# Patient Record
Sex: Female | Born: 1955 | Race: Black or African American | Hispanic: No | Smoking: Never smoker
Health system: Southern US, Community
[De-identification: ages and names within clinical notes are randomized; demographics above are authoritative.]

## PROBLEM LIST (undated history)

## (undated) DIAGNOSIS — C801 Malignant (primary) neoplasm, unspecified: Secondary | ICD-10-CM

## (undated) DIAGNOSIS — T7840XA Allergy, unspecified, initial encounter: Secondary | ICD-10-CM

## (undated) DIAGNOSIS — C189 Malignant neoplasm of colon, unspecified: Secondary | ICD-10-CM

## (undated) DIAGNOSIS — K922 Gastrointestinal hemorrhage, unspecified: Secondary | ICD-10-CM

## (undated) DIAGNOSIS — D649 Anemia, unspecified: Secondary | ICD-10-CM

## (undated) HISTORY — DX: Allergy, unspecified, initial encounter: T78.40XA

## (undated) HISTORY — DX: Malignant neoplasm of colon, unspecified: C18.9

## (undated) HISTORY — DX: Gastrointestinal hemorrhage, unspecified: K92.2

## (undated) HISTORY — DX: Anemia, unspecified: D64.9

---

## 2004-10-13 ENCOUNTER — Emergency Department: Payer: Self-pay | Admitting: Emergency Medicine

## 2010-08-25 ENCOUNTER — Emergency Department: Payer: Self-pay | Admitting: Emergency Medicine

## 2021-03-21 ENCOUNTER — Other Ambulatory Visit: Payer: Self-pay

## 2021-03-21 ENCOUNTER — Emergency Department
Admission: EM | Admit: 2021-03-21 | Discharge: 2021-03-22 | Disposition: A | Discharge status: Home / Self Care | Payer: BC Managed Care – PPO | Attending: Emergency Medicine | Admitting: Emergency Medicine

## 2021-03-21 DIAGNOSIS — D649 Anemia, unspecified: Secondary | ICD-10-CM | POA: Diagnosis not present

## 2021-03-21 DIAGNOSIS — R110 Nausea: Secondary | ICD-10-CM | POA: Diagnosis present

## 2021-03-21 LAB — IRON AND TIBC
Iron: 10 ug/dL — ABNORMAL LOW (ref 28–170)
Saturation Ratios: 3 % — ABNORMAL LOW (ref 10.4–31.8)
TIBC: 335 ug/dL (ref 250–450)
UIBC: 325 ug/dL

## 2021-03-21 LAB — COMPREHENSIVE METABOLIC PANEL
ALT: 7 U/L (ref 0–44)
AST: 12 U/L — ABNORMAL LOW (ref 15–41)
Albumin: 3 g/dL — ABNORMAL LOW (ref 3.5–5.0)
Alkaline Phosphatase: 63 U/L (ref 38–126)
Anion gap: 7 (ref 5–15)
BUN: 11 mg/dL (ref 8–23)
CO2: 26 mmol/L (ref 22–32)
Calcium: 9 mg/dL (ref 8.9–10.3)
Chloride: 104 mmol/L (ref 98–111)
Creatinine, Ser: 0.72 mg/dL (ref 0.44–1.00)
GFR, Estimated: >60 mL/min (ref >60)
Glucose, Bld: 96 mg/dL (ref 70–99)
Potassium: 3.5 mmol/L (ref 3.5–5.1)
Sodium: 137 mmol/L (ref 135–145)
Total Bilirubin: 0.3 mg/dL (ref 0.3–1.2)
Total Protein: 8 g/dL (ref 6.5–8.1)

## 2021-03-21 LAB — CBC WITH DIFFERENTIAL/PLATELET
Abs Immature Granulocytes: 0.03 10*3/uL (ref 0.00–0.07)
Basophils Absolute: 0.1 10*3/uL (ref 0.0–0.1)
Basophils Relative: 1 %
Eosinophils Absolute: 0.2 10*3/uL (ref 0.0–0.5)
Eosinophils Relative: 2 %
HCT: 22.8 % — ABNORMAL LOW (ref 36.0–46.0)
Hemoglobin: 6.7 g/dL — ABNORMAL LOW (ref 12.0–15.0)
Immature Granulocytes: 0 %
Lymphocytes Relative: 17 %
Lymphs Abs: 1.7 10*3/uL (ref 0.7–4.0)
MCH: 19.9 pg — ABNORMAL LOW (ref 26.0–34.0)
MCHC: 29.4 g/dL — ABNORMAL LOW (ref 30.0–36.0)
MCV: 67.7 fL — ABNORMAL LOW (ref 80.0–100.0)
Monocytes Absolute: 0.5 10*3/uL (ref 0.1–1.0)
Monocytes Relative: 5 %
Neutro Abs: 7.4 10*3/uL (ref 1.7–7.7)
Neutrophils Relative %: 75 %
Platelets: 425 10*3/uL — ABNORMAL HIGH (ref 150–400)
RBC: 3.37 MIL/uL — ABNORMAL LOW (ref 3.87–5.11)
RDW: 16.7 % — ABNORMAL HIGH (ref 11.5–15.5)
Smear Review: NORMAL
WBC: 9.8 10*3/uL (ref 4.0–10.5)
nRBC: 0 % (ref 0.0–0.2)

## 2021-03-21 LAB — URINALYSIS, COMPLETE (UACMP) WITH MICROSCOPIC
Bacteria, UA: NONE SEEN
Bilirubin Urine: NEGATIVE
Glucose, UA: NEGATIVE mg/dL
Ketones, ur: 20 mg/dL — AB
Nitrite: NEGATIVE
Protein, ur: 30 mg/dL — AB
Specific Gravity, Urine: 1.028 (ref 1.005–1.030)
WBC, UA: >50 WBC/hpf — ABNORMAL HIGH (ref 0–5)
pH: 5 (ref 5.0–8.0)

## 2021-03-21 LAB — FERRITIN: Ferritin: 27 ng/mL (ref 11–307)

## 2021-03-21 LAB — ABO/RH: ABO/RH(D): O POS

## 2021-03-21 LAB — HEMOGLOBIN AND HEMATOCRIT, BLOOD
HCT: 23.5 % — ABNORMAL LOW (ref 36.0–46.0)
Hemoglobin: 6.9 g/dL — ABNORMAL LOW (ref 12.0–15.0)

## 2021-03-21 LAB — VITAMIN B12: Vitamin B-12: 534 pg/mL (ref 180–914)

## 2021-03-21 LAB — RETICULOCYTES
Immature Retic Fract: 11.9 % (ref 2.3–15.9)
RBC.: 3.48 MIL/uL — ABNORMAL LOW (ref 3.87–5.11)
Retic Count, Absolute: 25.8 10*3/uL (ref 19.0–186.0)
Retic Ct Pct: 0.7 % (ref 0.4–3.1)

## 2021-03-21 LAB — PREPARE RBC (CROSSMATCH)

## 2021-03-21 LAB — FOLATE: Folate: 9.3 ng/mL (ref >5.9)

## 2021-03-21 MED ORDER — ONDANSETRON 4 MG PO TBDP
4.0000 mg | ORAL_TABLET | Freq: Once | ORAL | Status: AC
  Administered 2021-03-21: 4 mg via ORAL
  Filled 2021-03-21: qty 1

## 2021-03-21 MED ORDER — SODIUM CHLORIDE 0.9 % IV SOLN
10.0000 mL/h | Freq: Once | INTRAVENOUS | Status: AC
  Administered 2021-03-21: 10 mL/h via INTRAVENOUS

## 2021-03-21 NOTE — ED Notes (Signed)
Rate now increased from 43ml/hr to 164ml/hr -- blood continues to transfuse via 20G R AC; dressing dry and intact- no edema, tenderness, redness at site

## 2021-03-21 NOTE — ED Provider Notes (Signed)
[facility] Emergency Department Provider Note   ____________________________________________   Event Date/Time   First MD Initiated Contact with Patient 03/21/21 1448     (approximate)  I have reviewed the triage vital signs and the nursing notes.   HISTORY  Chief Complaint Nausea   HPI Alicia Coleman is a 65 y.o. female presents to the ED with complaint of nausea for approximately 1 week.  Patient states that she was prescribed Augmentin which she is taken intermittently due to stomach upset.  She was originally prescribed this for sinus infection.  Patient also reports that her stomach feels "bloated" and that people around her telling her that she has lost weight which makes her feel like she has a sickness and not doing well.  She states that she has had both COVID vaccines in January/February and is worried that she is having some "side effects".  She denies any actual vomiting or diarrhea.  Denies any fever, chills, cough or known exposure to COVID.  She reports approximately a 10 to 15 pound weight loss since the beginning of the year but also she has cut out certain foods from her diet which may attribute to her weight loss.  Currently she denies any pain.         History reviewed. No pertinent past medical history.  There are no problems to display for this patient.   History reviewed. No pertinent surgical history.  Prior to Admission medications   Not on File    Allergies Patient has no allergy information on record.  No family history on file.  Social History    Review of Systems  Constitutional: No fever/chills Eyes: No visual changes. ENT: No sore throat. Cardiovascular: Denies chest pain. Respiratory: Denies shortness of breath.  Negative for cough. Gastrointestinal: No abdominal pain.  Positive for bloating sensation.  Positive nausea, no vomiting.  No diarrhea.  No constipation. Genitourinary: Negative for dysuria.   Negative for hematuria. Musculoskeletal: Negative for muscle aches. Skin: Negative for rash. Neurological: Negative for headaches, focal weakness or numbness.  ____________________________________________   PHYSICAL EXAM:  VITAL SIGNS: ED Triage Vitals [03/21/21 1433]  Enc Vitals Group     BP 124/75     Pulse Rate 99     Resp 18     Temp 98.6 F (37 C)     Temp Source Oral     SpO2 98 %     Weight 160 lb (72.6 kg)     Height 5\' 6"  (1.676 m)     Head Circumference      Peak Flow      Pain Score 0     Pain Loc      Pain Edu?      Excl. in [place]?     Constitutional: Alert and oriented. Well appearing and in no acute distress. Eyes: Conjunctivae are normal.  Head: Atraumatic. Nose: No congestion/rhinnorhea. Mouth/Throat: Mucous membranes are moist.  Oropharynx non-erythematous.  No exudate noted. Neck: No stridor.   Cardiovascular: Normal rate, regular rhythm. Grossly normal heart sounds.  Good peripheral circulation. Respiratory: Normal respiratory effort.  No retractions. Lungs CTAB. Gastrointestinal: Soft and nontender. No distention.  Bowel sounds normoactive x4 quadrants.  No CVA tenderness.  Rectal exam, nontender with Hemoccult negative.  Also control was noted to be positive. Musculoskeletal: Moves upper and lower extremities without any difficulty.  Normal gait was noted.  Patient is ambulatory without any assistance. Neurologic:  Normal speech and language. No gross focal neurologic  deficits are appreciated. No gait instability. Skin:  Skin is warm, dry and intact. No rash noted. Psychiatric: Mood and affect are normal. Speech and behavior are normal.  ____________________________________________   LABS (all labs ordered are listed, but only abnormal results are displayed)  Labs Reviewed  CBC WITH DIFFERENTIAL/PLATELET - Abnormal; Notable for the following components:      Result Value   RBC 3.37 (*)    Hemoglobin 6.7 (*)    HCT 22.8 (*)    MCV 67.7 (*)     MCH 19.9 (*)    MCHC 29.4 (*)    RDW 16.7 (*)    Platelets 425 (*)    All other components within normal limits  COMPREHENSIVE METABOLIC PANEL - Abnormal; Notable for the following components:   Albumin 3.0 (*)    AST 12 (*)    All other components within normal limits  URINALYSIS, COMPLETE (UACMP) WITH MICROSCOPIC - Abnormal; Notable for the following components:   Color, Urine YELLOW (*)    APPearance HAZY (*)    Hgb urine dipstick MODERATE (*)    Ketones, ur 20 (*)    Protein, ur 30 (*)    Leukocytes,Ua LARGE (*)    WBC, UA >50 (*)    All other components within normal limits  IRON AND TIBC - Abnormal; Notable for the following components:   Iron 10 (*)    Saturation Ratios 3 (*)    All other components within normal limits  RETICULOCYTES - Abnormal; Notable for the following components:   RBC. 3.48 (*)    All other components within normal limits  HEMOGLOBIN AND HEMATOCRIT, BLOOD - Abnormal; Notable for the following components:   Hemoglobin 6.9 (*)    HCT 23.5 (*)    All other components within normal limits  FOLATE  FERRITIN  VITAMIN B12  TYPE AND SCREEN  PREPARE RBC (CROSSMATCH)  ABO/RH   ____________________________________________  EKG   ____________________________________________  RADIOLOGY Leana Gamer, personally viewed and evaluated these images (plain radiographs) as part of my medical decision making, as well as reviewing the written report by the radiologist.   Official radiology report(s): No results found.  ____________________________________________   PROCEDURES  Procedure(s) performed (including Critical Care):  Procedures   ____________________________________________   INITIAL IMPRESSION / ASSESSMENT AND PLAN / ED COURSE  As part of my medical decision making, I reviewed the following data within the electronic MEDICAL RECORD NUMBER Notes from prior ED visits and [place] Controlled Substance  Database  ----------------------------------------- 5:20 PM on 03/21/2021 ----------------------------------------- Discussed blood products and blood transfusion with patient.  We discussed options of iron tablets versus receiving a blood transfusion.  Patient is aware that she will need to follow-up with her hematologist either way.  Patient agrees to have blood transfusion in the ED. discussed with Dr. Archie Balboa and work-up for transfusion ordered.  65 year old female presents to the ED with complaint of nausea after taking Augmentin for sinus infection.  Patient states that the first tablet made her sick and she did not take any more until the next day when she again tried it with nausea.  She denied any other symptoms.  Incidental finding with lab work-up was that patient was severely anemic with a hemoglobin of 6.9 and hematocrit 23.5.  Discussed this with Dr. Archie Balboa and also spent a great deal of time talking to the patient about the difference between getting blood products tonight versus taking tablets.  She was told that either way she would  need to follow-up with the hematologist.  Patient agrees with this and elected to have packed red blood cells tonight.  Patient care is being transferred to Dr. Archie Balboa at 2016.    Clinical Course as of 03/21/21 2016  Fri Mar 21, 2021  1640 Hemoglobin(!): 6.7 [RS]    Clinical Course User Index [RS] Johnn Hai, PA-C     ____________________________________________   FINAL CLINICAL IMPRESSION(S) / ED DIAGNOSES  Final diagnoses:  Severe anemia     ED Discharge Orders    None      *Please note:  Jonise Weightman was evaluated in Emergency Department on 03/21/2021 for the symptoms described in the history of present illness. She was evaluated in the context of the global COVID-19 pandemic, which necessitated consideration that the patient might be at risk for infection with the SARS-CoV-2 virus that causes COVID-19. Institutional  protocols and algorithms that pertain to the evaluation of patients at risk for COVID-19 are in a state of rapid change based on information released by regulatory bodies including the CDC and federal and state organizations. These policies and algorithms were followed during the patient's care in the ED.  Some ED evaluations and interventions may be delayed as a result of limited staffing during and the pandemic.*   Note:  This document was prepared using Dragon voice recognition software and may include unintentional dictation errors.    Johnn Hai, PA-C 03/21/21 2017    Nance Pear, MD 03/21/21 [phone]

## 2021-03-21 NOTE — ED Notes (Signed)
Pt awake, alert and oriented x4.  RR even and unlabored on RA with symmetrical rise and fall of chest-- O2 sats stable.  Cardiac monitoring maintained- NSR HR  78. S1 and S2 regular.  Abdomen soft, nontender, nondistended- pt arrived earlier with c/o nausea and abd pain however denies active pain and active nausea.  Skin warm dry and intact.  Plan for 1 unit prbc transfusion-per blood bank 2nd cross type being run at this time- will be completed in approx 10 min.  Blood consent signed.  Will monitor for acute changes and maintain plan of care.

## 2021-03-21 NOTE — ED Notes (Signed)
Pt now receiving one unit prbcs - well tolerating at this time.  Pt denies issues with sob or dizziness but does report increased fatigue recently and possibly bright red blood noted in stool approx one week prior to arrival.  HgB 6.7 -Iron 10-- no h/o anemia

## 2021-03-21 NOTE — Discharge Instructions (Signed)
Please follow up with hematology (Dr. Janese Banks)

## 2021-03-21 NOTE — ED Triage Notes (Signed)
Pt to ED POV for nausea that started after taking Augmentin. Taking antibiotics for sinuses  Denies other complaints

## 2021-03-22 LAB — BPAM RBC
Blood Product Expiration Date: 202205232359
ISSUE DATE / TIME: 202204222002
Unit Type and Rh: 5100

## 2021-03-22 LAB — TYPE AND SCREEN
ABO/RH(D): O POS
Antibody Screen: NEGATIVE
Unit division: 0

## 2021-03-27 ENCOUNTER — Inpatient Hospital Stay: Payer: BC Managed Care – PPO | Attending: Oncology | Admitting: Oncology

## 2021-03-27 ENCOUNTER — Encounter: Payer: Self-pay | Admitting: Oncology

## 2021-03-27 ENCOUNTER — Inpatient Hospital Stay: Payer: BC Managed Care – PPO

## 2021-03-27 ENCOUNTER — Other Ambulatory Visit: Payer: Self-pay

## 2021-03-27 VITALS — BP 116/80 | HR 78 | Temp 97.3°F | Wt 145.6 lb

## 2021-03-27 DIAGNOSIS — R110 Nausea: Secondary | ICD-10-CM | POA: Insufficient documentation

## 2021-03-27 DIAGNOSIS — R109 Unspecified abdominal pain: Secondary | ICD-10-CM | POA: Diagnosis not present

## 2021-03-27 DIAGNOSIS — D509 Iron deficiency anemia, unspecified: Secondary | ICD-10-CM

## 2021-03-27 LAB — URINALYSIS, COMPLETE (UACMP) WITH MICROSCOPIC
Bacteria, UA: NONE SEEN
Bilirubin Urine: NEGATIVE
Glucose, UA: NEGATIVE mg/dL
Ketones, ur: NEGATIVE mg/dL
Nitrite: NEGATIVE
Protein, ur: NEGATIVE mg/dL
Specific Gravity, Urine: 1.024 (ref 1.005–1.030)
pH: 5 (ref 5.0–8.0)

## 2021-03-27 LAB — CBC WITH DIFFERENTIAL/PLATELET
Abs Immature Granulocytes: 0.02 10*3/uL (ref 0.00–0.07)
Basophils Absolute: 0.1 10*3/uL (ref 0.0–0.1)
Basophils Relative: 1 %
Eosinophils Absolute: 0.5 10*3/uL (ref 0.0–0.5)
Eosinophils Relative: 6 %
HCT: 28.4 % — ABNORMAL LOW (ref 36.0–46.0)
Hemoglobin: 8.4 g/dL — ABNORMAL LOW (ref 12.0–15.0)
Immature Granulocytes: 0 %
Lymphocytes Relative: 20 %
Lymphs Abs: 1.8 10*3/uL (ref 0.7–4.0)
MCH: 21.2 pg — ABNORMAL LOW (ref 26.0–34.0)
MCHC: 29.6 g/dL — ABNORMAL LOW (ref 30.0–36.0)
MCV: 71.7 fL — ABNORMAL LOW (ref 80.0–100.0)
Monocytes Absolute: 0.4 10*3/uL (ref 0.1–1.0)
Monocytes Relative: 5 %
Neutro Abs: 6 10*3/uL (ref 1.7–7.7)
Neutrophils Relative %: 68 %
Platelets: 406 10*3/uL — ABNORMAL HIGH (ref 150–400)
RBC: 3.96 MIL/uL (ref 3.87–5.11)
RDW: 19.8 % — ABNORMAL HIGH (ref 11.5–15.5)
WBC: 8.8 10*3/uL (ref 4.0–10.5)
nRBC: 0 % (ref 0.0–0.2)

## 2021-03-27 NOTE — Progress Notes (Signed)
Hematology/Oncology Consult note [facility] [phone] [phone]  Patient Care Team: Pcp, No as PCP - General   Name of the patient: Alicia Coleman  [id number]  10/10/1956    Reason for referral-anemia   Referring physician-Dr. Archie Balboa  Date of visit: 03/27/21   History of presenting illness- Patient is a 65 year old female with no significant past medical history who was seen in the ER for symptoms of nausea.  She also underwent routine blood work which was suggestive of significant anemia with an H&H of 6.7/22.8 with an MCV of 67.7.  White count was normal at 9.8 and platelet count was mildly elevated at 425.  CMP was unremarkable.  B12 levels were normal at 534 and folate normal at 933.  Iron studies showed a low iron saturation of 3%, low serum iron of 10 and a ferritin of 27.  She underwent 1 unit of PRBC transfusion in the ER and was referred to [place] as an outpatient.  Currently patient reports some nausea and abdominal pain.  Denies any consistent use of NSAIDs Goody powders or BC powder.  Denies any family history of colon cancer.  Denies any blood in her stool or dark melanotic stools.  ECOG PS- 1  Pain scale- 3   Review of systems- Review of Systems  Constitutional: Positive for malaise/fatigue. Negative for chills, fever and weight loss.  HENT: Negative for congestion, ear discharge and nosebleeds.   Eyes: Negative for blurred vision.  Respiratory: Negative for cough, hemoptysis, sputum production, shortness of breath and wheezing.   Cardiovascular: Negative for chest pain, palpitations, orthopnea and claudication.  Gastrointestinal: Positive for abdominal pain and nausea. Negative for blood in stool, constipation, diarrhea, heartburn, melena and vomiting.  Genitourinary: Negative for dysuria, flank pain, frequency, hematuria and urgency.  Musculoskeletal: Negative for back pain, joint pain and myalgias.  Skin: Negative for  rash.  Neurological: Negative for dizziness, tingling, focal weakness, seizures, weakness and headaches.  Endo/Heme/Allergies: Does not bruise/bleed easily.  Psychiatric/Behavioral: Negative for depression and suicidal ideas. The patient does not have insomnia.     Not on File  There are no problems to display for this patient.    Past Medical History:  Diagnosis Date  . Allergy   . Anemia      No past surgical history on file.  Social History   Socioeconomic History  . Marital status: Married    Spouse name: Not on file  . Number of children: Not on file  . Years of education: Not on file  . Highest education level: Not on file  Occupational History  . Not on file  Tobacco Use  . Smoking status: Never Smoker  . Smokeless tobacco: Not on file  Substance and Sexual Activity  . Alcohol use: Not Currently  . Drug use: Never  . Sexual activity: Not Currently  Other Topics Concern  . Not on file  Social History Narrative  . Not on file   Social Determinants of Health   Financial Resource Strain: Not on file  Food Insecurity: Not on file  Transportation Needs: Not on file  Physical Activity: Not on file  Stress: Not on file  Social Connections: Not on file  Intimate Partner Violence: Not on file     Family History  Problem Relation Age of Onset  . Stroke Mother   . Hypertension Mother   . Cancer Father      Current Outpatient Medications:  .  amoxicillin-clavulanate (AUGMENTIN) 875-125 MG tablet,  SMARTSIG:1 Tablet(s) By Mouth Every 12 Hours, Disp: , Rfl:    Physical exam:  Vitals:   03/27/21 1521  BP: 116/80  Pulse: 78  Temp: (!) 97.3 F (36.3 C)  TempSrc: Tympanic  SpO2: 100%  Weight: 145 lb 9.6 oz (66 kg)   Physical Exam Constitutional:      General: She is not in acute distress. Cardiovascular:     Rate and Rhythm: Normal rate and regular rhythm.     Heart sounds: Normal heart sounds.  Pulmonary:     Effort: Pulmonary effort is normal.      Breath sounds: Normal breath sounds.  Abdominal:     General: Bowel sounds are normal.     Palpations: Abdomen is soft.  Skin:    General: Skin is warm and dry.  Neurological:     Mental Status: She is alert and oriented to person, place, and time.        CMP Latest Ref Rng & Units 03/21/2021  Glucose 70 - 99 mg/dL 96  BUN 8 - 23 mg/dL 11  Creatinine 0.44 - 1.00 mg/dL 0.72  Sodium 135 - 145 mmol/L 137  Potassium 3.5 - 5.1 mmol/L 3.5  Chloride 98 - 111 mmol/L 104  CO2 22 - 32 mmol/L 26  Calcium 8.9 - 10.3 mg/dL 9.0  Total Protein 6.5 - 8.1 g/dL 8.0  Total Bilirubin 0.3 - 1.2 mg/dL 0.3  Alkaline Phos 38 - 126 U/L 63  AST 15 - 41 U/L 12(L)  ALT 0 - 44 U/L 7   CBC Latest Ref Rng & Units 03/27/2021  WBC 4.0 - 10.5 K/uL 8.8  Hemoglobin 12.0 - 15.0 g/dL 8.4(L)  Hematocrit 36.0 - 46.0 % 28.4(L)  Platelets 150 - 400 K/uL 406(H)    Assessment and plan- Patient is a 65 y.o. female referred by ER for significant iron deficiency anemia  Iron deficiency anemia in a postmenopausal female is concerning for ongoing blood loss.  I will refer her to GI for a complete evaluation.  I will also obtain a CT abdomen pelvis with contrast given her ongoing symptoms of nausea and abdominal pain.  We will also proceed with 5 doses of Venna for her at this time.  Discussed risks and benefits of Venofer including all but not limited to nausea, leg swelling and possible risk of infusion reaction.  Patient understands and agrees to proceed as planned.  Repeat CBC ferritin and iron studies in 8 weeks followed by in person visit.  I will check a urinalysis today   Thank you for this kind referral and the opportunity to participate in the care of this  Patient   Visit Diagnosis 1. Iron deficiency anemia, unspecified iron deficiency anemia type     Dr. Randa Evens, MD, MPH [facility] [phone] 03/27/2021

## 2021-03-28 ENCOUNTER — Encounter: Payer: Self-pay | Admitting: *Deleted

## 2021-03-30 DIAGNOSIS — D509 Iron deficiency anemia, unspecified: Secondary | ICD-10-CM | POA: Insufficient documentation

## 2021-04-03 ENCOUNTER — Inpatient Hospital Stay: Payer: BC Managed Care – PPO | Attending: Oncology

## 2021-04-03 ENCOUNTER — Other Ambulatory Visit: Payer: Self-pay

## 2021-04-03 VITALS — BP 103/76 | HR 89 | Temp 97.3°F | Resp 16

## 2021-04-03 DIAGNOSIS — C19 Malignant neoplasm of rectosigmoid junction: Secondary | ICD-10-CM | POA: Diagnosis not present

## 2021-04-03 DIAGNOSIS — C787 Secondary malignant neoplasm of liver and intrahepatic bile duct: Secondary | ICD-10-CM | POA: Insufficient documentation

## 2021-04-03 DIAGNOSIS — D509 Iron deficiency anemia, unspecified: Secondary | ICD-10-CM

## 2021-04-03 MED ORDER — SODIUM CHLORIDE 0.9 % IV SOLN
Freq: Once | INTRAVENOUS | Status: AC
  Filled 2021-04-03: qty 250

## 2021-04-03 MED ORDER — IRON SUCROSE 20 MG/ML IV SOLN
200.0000 mg | Freq: Once | INTRAVENOUS | Status: AC
  Administered 2021-04-03: 200 mg via INTRAVENOUS
  Filled 2021-04-03: qty 10

## 2021-04-03 MED ORDER — SODIUM CHLORIDE 0.9 % IV SOLN: 200.0000 mg | INTRAVENOUS | Status: DC

## 2021-04-07 ENCOUNTER — Inpatient Hospital Stay: Payer: BC Managed Care – PPO

## 2021-04-07 VITALS — BP 105/72 | HR 85 | Temp 98.4°F | Resp 16

## 2021-04-07 DIAGNOSIS — D509 Iron deficiency anemia, unspecified: Secondary | ICD-10-CM | POA: Diagnosis not present

## 2021-04-07 MED ORDER — IRON SUCROSE 20 MG/ML IV SOLN
200.0000 mg | Freq: Once | INTRAVENOUS | Status: AC
  Administered 2021-04-07: 200 mg via INTRAVENOUS
  Filled 2021-04-07: qty 10

## 2021-04-07 MED ORDER — SODIUM CHLORIDE 0.9 % IV SOLN: 200.0000 mg | INTRAVENOUS | Status: DC

## 2021-04-07 MED ORDER — SODIUM CHLORIDE 0.9 % IV SOLN
Freq: Once | INTRAVENOUS | Status: AC
Start: 2021-04-07 — End: 2021-04-07
  Filled 2021-04-07: qty 250

## 2021-04-07 NOTE — Patient Instructions (Signed)

## 2021-04-08 ENCOUNTER — Telehealth: Payer: Self-pay | Admitting: Oncology

## 2021-04-08 NOTE — Telephone Encounter (Signed)
Spoke with patient to notify her of scheduled CT that was approved at the [facility]. Patient was agreeable. Updated AVS--will give to patient tomorrow on 5/11 during her iron infusion.

## 2021-04-09 ENCOUNTER — Other Ambulatory Visit: Payer: Self-pay

## 2021-04-09 ENCOUNTER — Inpatient Hospital Stay: Payer: BC Managed Care – PPO

## 2021-04-09 VITALS — BP 104/76 | HR 96 | Temp 97.9°F | Resp 20

## 2021-04-09 DIAGNOSIS — D509 Iron deficiency anemia, unspecified: Secondary | ICD-10-CM

## 2021-04-09 MED ORDER — SODIUM CHLORIDE 0.9 % IV SOLN
Freq: Once | INTRAVENOUS | Status: AC
  Filled 2021-04-09: qty 250

## 2021-04-09 MED ORDER — IRON SUCROSE 20 MG/ML IV SOLN
200.0000 mg | Freq: Once | INTRAVENOUS | Status: AC
  Administered 2021-04-09: 200 mg via INTRAVENOUS
  Filled 2021-04-09: qty 10

## 2021-04-09 MED ORDER — SODIUM CHLORIDE 0.9 % IV SOLN: 200.0000 mg | INTRAVENOUS | Status: DC

## 2021-04-10 ENCOUNTER — Ambulatory Visit: Payer: BC Managed Care – PPO

## 2021-04-11 ENCOUNTER — Other Ambulatory Visit: Payer: Self-pay

## 2021-04-11 ENCOUNTER — Inpatient Hospital Stay: Payer: BC Managed Care – PPO

## 2021-04-11 VITALS — BP 99/66 | HR 94 | Temp 99.5°F | Resp 20

## 2021-04-11 DIAGNOSIS — D509 Iron deficiency anemia, unspecified: Secondary | ICD-10-CM | POA: Diagnosis not present

## 2021-04-11 MED ORDER — SODIUM CHLORIDE 0.9 % IV SOLN
200.0000 mg | INTRAVENOUS | Status: DC
  Filled 2021-04-11: qty 10

## 2021-04-11 MED ORDER — SODIUM CHLORIDE 0.9 % IV SOLN
Freq: Once | INTRAVENOUS | Status: AC
  Filled 2021-04-11: qty 250

## 2021-04-11 MED ORDER — IRON SUCROSE 20 MG/ML IV SOLN
200.0000 mg | Freq: Once | INTRAVENOUS | Status: AC
  Administered 2021-04-11: 200 mg via INTRAVENOUS
  Filled 2021-04-11: qty 10

## 2021-04-14 ENCOUNTER — Inpatient Hospital Stay: Payer: BC Managed Care – PPO

## 2021-04-14 VITALS — BP 98/65 | HR 91 | Temp 98.8°F | Resp 18

## 2021-04-14 DIAGNOSIS — D509 Iron deficiency anemia, unspecified: Secondary | ICD-10-CM | POA: Diagnosis not present

## 2021-04-14 MED ORDER — SODIUM CHLORIDE 0.9 % IV SOLN
Freq: Once | INTRAVENOUS | Status: AC
  Filled 2021-04-14: qty 250

## 2021-04-14 MED ORDER — IRON SUCROSE 20 MG/ML IV SOLN
200.0000 mg | Freq: Once | INTRAVENOUS | Status: AC
  Administered 2021-04-14: 200 mg via INTRAVENOUS
  Filled 2021-04-14: qty 10

## 2021-04-14 MED ORDER — SODIUM CHLORIDE 0.9 % IV SOLN: 200.0000 mg | INTRAVENOUS | Status: DC

## 2021-04-22 ENCOUNTER — Ambulatory Visit
Admission: RE | Admit: 2021-04-22 | Discharge: 2021-04-22 | Disposition: A | Discharge status: Home / Self Care | Payer: BC Managed Care – PPO | Source: Ambulatory Visit | Attending: Oncology | Admitting: Oncology

## 2021-04-22 DIAGNOSIS — D509 Iron deficiency anemia, unspecified: Secondary | ICD-10-CM

## 2021-04-22 MED ORDER — IOPAMIDOL (ISOVUE-300) INJECTION 61%
100.0000 mL | Freq: Once | INTRAVENOUS | Status: AC | PRN
  Administered 2021-04-22: 100 mL via INTRAVENOUS

## 2021-04-23 ENCOUNTER — Telehealth: Payer: Self-pay | Admitting: *Deleted

## 2021-04-23 ENCOUNTER — Other Ambulatory Visit: Payer: Self-pay

## 2021-04-23 ENCOUNTER — Encounter: Payer: Self-pay | Admitting: Surgery

## 2021-04-23 DIAGNOSIS — C187 Malignant neoplasm of sigmoid colon: Secondary | ICD-10-CM

## 2021-04-23 NOTE — Telephone Encounter (Signed)
Called the pt and left message that she had abnormal scan and md would like to see her on Friday 8:30. She called back and spoke to me and wants to know what was abnormal and what they think it is. I toldher we will need some more testing but it could be colon cancer. She is agreeable to the appt on Friday at 8:30

## 2021-04-23 NOTE — Progress Notes (Signed)
65 year old female asked to see by Dr. 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 Given obstructing nshe will need diverting colostomy as I palliation. At the same time we will perform port placement . Dr. Vicente Males to perform a [facility] for tissue diagnosis. I'll see her in my office early next week and coordinate further surgical care

## 2021-04-23 NOTE — Telephone Encounter (Signed)
I called the pt and let her know that scan was abnormal. I said they are seeing abnormal results in her abdomen. She wanted to know what was found. I told her a mass and also that she is impacted with stool.It also could be cancer. She says that makes sense due to when she goes to bathroom it is squirts-very little. Told her that gi doctor will be calling her for appt. They need to do test to see what is going on in her gi tract. Dr Janese Banks wants her to come in on Friday at 8:30. She is agreeable and be open to answer phone if md office calls her

## 2021-04-24 ENCOUNTER — Encounter: Payer: Self-pay | Admitting: Anesthesiology

## 2021-04-24 ENCOUNTER — Ambulatory Visit
Admission: RE | Admit: 2021-04-24 | Discharge: 2021-04-24 | Disposition: A | Discharge status: Home / Self Care | Payer: BC Managed Care – PPO | Attending: Gastroenterology | Admitting: Gastroenterology

## 2021-04-24 ENCOUNTER — Encounter
Admission: RE | Disposition: A | Discharge status: Home / Self Care | Payer: Self-pay | Source: Home / Self Care | Attending: Gastroenterology

## 2021-04-24 ENCOUNTER — Encounter: Payer: Self-pay | Admitting: Gastroenterology

## 2021-04-24 ENCOUNTER — Telehealth: Payer: Self-pay | Admitting: Surgery

## 2021-04-24 ENCOUNTER — Other Ambulatory Visit: Payer: Self-pay

## 2021-04-24 DIAGNOSIS — C187 Malignant neoplasm of sigmoid colon: Secondary | ICD-10-CM

## 2021-04-24 DIAGNOSIS — C19 Malignant neoplasm of rectosigmoid junction: Secondary | ICD-10-CM | POA: Diagnosis not present

## 2021-04-24 DIAGNOSIS — K56601 Complete intestinal obstruction, unspecified as to cause: Secondary | ICD-10-CM | POA: Insufficient documentation

## 2021-04-24 DIAGNOSIS — R933 Abnormal findings on diagnostic imaging of other parts of digestive tract: Secondary | ICD-10-CM | POA: Diagnosis present

## 2021-04-24 HISTORY — PX: FLEXIBLE SIGMOIDOSCOPY: SHX5431

## 2021-04-24 SURGERY — SIGMOIDOSCOPY, FLEXIBLE

## 2021-04-24 MED ORDER — NEOMYCIN SULFATE 500 MG PO TABS: ORAL_TABLET | ORAL | 0 refills | Status: DC

## 2021-04-24 MED ORDER — SODIUM CHLORIDE 0.9 % IV SOLN: INTRAVENOUS | Status: DC

## 2021-04-24 MED ORDER — METRONIDAZOLE 500 MG PO TABS: ORAL_TABLET | ORAL | 0 refills | Status: DC

## 2021-04-24 MED ORDER — POLYETHYLENE GLYCOL 3350 17 GM/SCOOP PO POWD: ORAL | 0 refills | Status: DC

## 2021-04-24 MED ORDER — BISACODYL 5 MG PO TBEC: DELAYED_RELEASE_TABLET | ORAL | 0 refills | Status: DC

## 2021-04-24 NOTE — H&P (Signed)
     [name], MD [address], [place], [place], [place], [address] [address], [place], [place], [place], [address] Phone: [phone]  Fax: [phone]  Primary Care Physician:  Pcp, No   Pre-Procedure History & Physical: HPI:  Alicia Coleman is a 65 y.o. female is here for a sigmoidoscopy.   Past Medical History:  Diagnosis Date  . Allergy   . Anemia     History reviewed. No pertinent surgical history.  Prior to Admission medications   Medication Sig Start Date End Date Taking? Authorizing Provider  amoxicillin-clavulanate (AUGMENTIN) 875-125 MG tablet SMARTSIG:1 Tablet(s) By Mouth Every 12 Hours Patient not taking: No sig reported 03/14/21   [provider]    Allergies as of 04/23/2021  . (No Known Allergies)    Family History  Problem Relation Age of Onset  . Stroke Mother   . Hypertension Mother   . Cancer Father     Social History   Socioeconomic History  . Marital status: Married    Spouse name: Not on file  . Number of children: Not on file  . Years of education: Not on file  . Highest education level: Not on file  Occupational History  . Not on file  Tobacco Use  . Smoking status: Never Smoker  . Smokeless tobacco: Never Used  Substance and Sexual Activity  . Alcohol use: Not Currently  . Drug use: Never  . Sexual activity: Not Currently  Other Topics Concern  . Not on file  Social History Narrative  . Not on file   Social Determinants of Health   Financial Resource Strain: Not on file  Food Insecurity: Not on file  Transportation Needs: Not on file  Physical Activity: Not on file  Stress: Not on file  Social Connections: Not on file  Intimate Partner Violence: Not on file    Review of Systems: See HPI, otherwise negative ROS  Physical Exam: BP 112/69   Pulse 84   Temp 97.8 F (36.6 C) (Temporal)   Resp 18   Ht 5\' 6"  (1.676 m)   Wt 67.6 kg   LMP  (LMP Unknown)   SpO2 100%   BMI 24.05 kg/m  General:    Alert,  pleasant and cooperative in NAD Head:  Normocephalic and atraumatic. Neck:  Supple; no masses or thyromegaly. Lungs:  Clear throughout to auscultation, normal respiratory effort.    Heart:  +S1, +S2, Regular rate and rhythm, No edema. Abdomen:  Soft, nontender and nondistended. Normal bowel sounds, without guarding, and without rebound.   Neurologic:  Alert and  oriented x4;  grossly normal neurologically.  Impression/Plan: Alicia Coleman is here for a sigmoidoscopy to evaluate colon mass Risks, benefits, limitations, and alternatives regarding  colonoscopy have been reviewed with the patient.  Questions have been answered.  All parties agreeable.   [name], MD  04/24/2021, 11:17 AM

## 2021-04-24 NOTE — Telephone Encounter (Signed)
Patient has been advised of Pre-Admission date/time, COVID Testing date and Surgery date.  Surgery Date: 05/01/21 Preadmission Testing Date: 04/29/21 in person, will also have [place] markings for ostomy bag, Covid test to follow as will be surgery admit. Patient to arrive @ 1:30  Covid Testing Date: 04/29/21.  Patient has been advised to go to the [facility] ([place])  Patient will also come by the office on 5/27 to pick up information regarding her colon prep that she needs to do prior to surgery.   Patient also reminded that she has an appointment here in the office with Dr. Dahlia Byes on 04/30/21, patient given our address.      Patient has been made aware to call [phone], between 1-3:00pm the day before surgery, to find out what time to arrive for surgery.   Patient verbalized understanding with the above and wished well.

## 2021-04-24 NOTE — Op Note (Signed)
[facility] Gastroenterology Patient Name: Alicia Coleman Procedure Date: 04/24/2021 11:18 AM MRN: [id number] Account #: [id number] Date of Birth: 1956/09/11 Admit Type: Outpatient Age: 65 Room: [facility] ENDO ROOM 3 Gender: Female Note Status: Finalized Procedure:             Flexible Sigmoidoscopy Indications:           Abnormal CT of the GI tract Providers:             [name] MD, MD Referring MD:          No Local Md, MD (Referring MD) Medicines:             None Complications:         No immediate complications. Procedure:             Pre-Anesthesia Assessment:                        - Prior to the procedure, a History and Physical was                         performed, and patient medications, allergies and                         sensitivities were reviewed. The patient's tolerance                         of previous anesthesia was reviewed.                        - The risks and benefits of the procedure and the                         sedation options and risks were discussed with the                         patient. All questions were answered and informed                         consent was obtained.                        - ASA Grade Assessment: III - A patient with severe                         systemic disease.                        After obtaining informed consent, the scope was passed                         under direct vision. The Colonoscope was introduced                         through the anus and advanced to the the rectosigmoid                         junction. The flexible sigmoidoscopy was accomplished                         with ease.  The patient tolerated the procedure well.                         The quality of the bowel preparation was adequate. Findings:      The perianal and digital rectal examinations were normal.      An ulcerated completely obstructing large mass was found in the       recto-sigmoid colon. The mass was  circumferential. In addition, its       diameter measured six mm. Oozing was present. Biopsies were taken with a       cold forceps for histology. 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 Biopsied. Recommendation:        - Discharge patient to home (with escort).                        - Advance diet as tolerated.                        - Await pathology results. Procedure Code(s):     --- Professional ---                        [id number], 88, Sigmoidoscopy, flexible; with biopsy,                         single or multiple Diagnosis Code(s):     --- Professional ---                        [phone], Other complete intestinal obstruction                        D49.0, Neoplasm of unspecified behavior of digestive                         system                        R93.3, Abnormal findings on diagnostic imaging of                         other parts of digestive tract CPT copyright 2019 American Medical Association. All rights reserved. The codes documented in this report are preliminary and upon coder review may  be revised to meet current compliance requirements. [name], MD [name] MD, MD 04/24/2021 11:30:09 AM This report has been signed electronically. Number of Addenda: 0 Note Initiated On: 04/24/2021 11:18 AM Total Procedure Duration: 0 hours 5 minutes 4 seconds  Estimated Blood Loss:  Estimated blood loss: none.      [facility]

## 2021-04-25 ENCOUNTER — Other Ambulatory Visit: Payer: Self-pay | Admitting: Anatomic Pathology & Clinical Pathology

## 2021-04-25 ENCOUNTER — Encounter: Payer: Self-pay | Admitting: Gastroenterology

## 2021-04-25 ENCOUNTER — Other Ambulatory Visit: Payer: Self-pay

## 2021-04-25 ENCOUNTER — Inpatient Hospital Stay (HOSPITAL_BASED_OUTPATIENT_CLINIC_OR_DEPARTMENT_OTHER): Payer: BC Managed Care – PPO | Admitting: Oncology

## 2021-04-25 VITALS — BP 95/60 | HR 90 | Temp 98.2°F | Resp 20 | Wt 139.2 lb

## 2021-04-25 DIAGNOSIS — Z7189 Other specified counseling: Secondary | ICD-10-CM | POA: Diagnosis not present

## 2021-04-25 DIAGNOSIS — C189 Malignant neoplasm of colon, unspecified: Secondary | ICD-10-CM | POA: Insufficient documentation

## 2021-04-25 DIAGNOSIS — D509 Iron deficiency anemia, unspecified: Secondary | ICD-10-CM | POA: Diagnosis not present

## 2021-04-25 LAB — SURGICAL PATHOLOGY

## 2021-04-25 MED ORDER — PROCHLORPERAZINE MALEATE 10 MG PO TABS: 10.0000 mg | ORAL_TABLET | Freq: Four times a day (QID) | ORAL | 1 refills | Status: DC | PRN

## 2021-04-25 MED ORDER — LORAZEPAM 0.5 MG PO TABS: 0.5000 mg | ORAL_TABLET | Freq: Four times a day (QID) | ORAL | 0 refills | Status: DC | PRN

## 2021-04-25 MED ORDER — LIDOCAINE-PRILOCAINE 2.5-2.5 % EX CREA: TOPICAL_CREAM | CUTANEOUS | 3 refills | Status: DC

## 2021-04-25 MED ORDER — ONDANSETRON HCL 8 MG PO TABS: 8.0000 mg | ORAL_TABLET | Freq: Two times a day (BID) | ORAL | 1 refills | Status: DC | PRN

## 2021-04-25 MED ORDER — DEXAMETHASONE 4 MG PO TABS: 8.0000 mg | ORAL_TABLET | Freq: Every day | ORAL | 1 refills | Status: DC

## 2021-04-25 NOTE — Progress Notes (Signed)
Hematology/Oncology Consult note [facility]  Telephone:([phone] [phone]  Patient Care Team: Pcp, No as PCP - General   Name of the patient: Alicia Coleman  [id number]  [birth year]   Date of visit: 04/25/21  Diagnosis-metastatic rectal cancer with liver metastases  Chief complaint/ Reason for visit-discuss CT and pathology results and further management  Heme/Onc history: Patient is a 65 year old female who was referred from the ER for severe iron deficiency anemia.  She received IV Venofer for that.  I was concerned about malignancy given the degree of anemia and therefore obtain CT abdomen and pelvis with contrast CT showed 6.4 cm enhancing mass in the right liver and another mass measuring 4 cm.  Large volume stool in the right colon transverse and descending colon with irregular mass lesion in the distal sigmoid colon extending into the rectosigmoid junction with lateral extracolonic extension into the pelvic sidewall.  Apparent luminal narrowing at the level of the lesion which accounts for large volume of stool proximally.  Multiple pericolonic and perirectal lymph nodes.  Multiple peritoneal nodules are seen in the lower right pelvic sidewall.  Colon mass appears to be invading posterior uterus.Thrombosis of the superior rectal vein may be bland thrombus although direct tumor invasion not excluded.  Patient had a flexible sigmoidoscopy which showed a large obstructing mass in the rectosigmoid colon.  Scope could not be traversed beyond the obstruction.  Biopsies were taken and were consistent with adenocarcinoma   Interval history-reports some ongoing fatigue along with nausea.  She is having pencil thin stools but moving her bowels  ECOG PS- 1 Pain scale- 0   Review of systems- Review of Systems  Constitutional: Positive for malaise/fatigue. Negative for chills, fever and weight loss.  HENT: Negative for congestion, ear discharge and  nosebleeds.   Eyes: Negative for blurred vision.  Respiratory: Negative for cough, hemoptysis, sputum production, shortness of breath and wheezing.   Cardiovascular: Negative for chest pain, palpitations, orthopnea and claudication.  Gastrointestinal: Positive for abdominal pain and constipation. Negative for blood in stool, diarrhea, heartburn, melena, nausea and vomiting.  Genitourinary: Negative for dysuria, flank pain, frequency, hematuria and urgency.  Musculoskeletal: Negative for back pain, joint pain and myalgias.  Skin: Negative for rash.  Neurological: Negative for dizziness, tingling, focal weakness, seizures, weakness and headaches.  Endo/Heme/Allergies: Does not bruise/bleed easily.  Psychiatric/Behavioral: Negative for depression and suicidal ideas. The patient does not have insomnia.        No Known Allergies   Past Medical History:  Diagnosis Date  . Allergy   . Anemia      Past Surgical History:  Procedure Laterality Date  . FLEXIBLE SIGMOIDOSCOPY N/A 04/24/2021   Procedure: FLEXIBLE SIGMOIDOSCOPY;  Surgeon: [name], MD;  Location: [facility] ENDOSCOPY;  Service: Gastroenterology;  Laterality: N/A;    Social History   Socioeconomic History  . Marital status: Married    Spouse name: Not on file  . Number of children: Not on file  . Years of education: Not on file  . Highest education level: Not on file  Occupational History  . Not on file  Tobacco Use  . Smoking status: Never Smoker  . Smokeless tobacco: Never Used  Substance and Sexual Activity  . Alcohol use: Not Currently  . Drug use: Never  . Sexual activity: Not Currently  Other Topics Concern  . Not on file  Social History Narrative  . Not on file   Social Determinants of Health  Financial Resource Strain: Not on file  Food Insecurity: Not on file  Transportation Needs: Not on file  Physical Activity: Not on file  Stress: Not on file  Social Connections: Not on file  Intimate Partner  Violence: Not on file    Family History  Problem Relation Age of Onset  . Stroke Mother   . Hypertension Mother   . Cancer Father      Current Outpatient Medications:  .  amoxicillin-clavulanate (AUGMENTIN) 875-125 MG tablet, SMARTSIG:1 Tablet(s) By Mouth Every 12 Hours (Patient not taking: No sig reported), Disp: , Rfl:  .  bisacodyl (DULCOLAX) 5 MG EC tablet, Take all 4 tablets at 8 am the morning prior to your surgery., Disp: 4 tablet, Rfl: 0 .  metroNIDAZOLE (FLAGYL) 500 MG tablet, Take 2 tablets at 8AM, take 2 tablets at [facility], and take 2 tablets at 8PM the day prior to your surgery, Disp: 6 tablet, Rfl: 0 .  neomycin (MYCIFRADIN) 500 MG tablet, Take 2 tablet at 8am, take 2 tablets at 2pm, and take 2 tablets at 8pm the day prior to your surgery, Disp: 6 tablet, Rfl: 0 .  polyethylene glycol powder (MIRALAX) 17 GM/SCOOP powder, Mix full container in 64 ounces of Gatorade or other clear liquid. NO Red liquids, Disp: 238 g, Rfl: 0  Physical exam:  Vitals:   04/25/21 0846  BP: 95/60  Pulse: 90  Resp: 20  Temp: 98.2 F (36.8 C)  TempSrc: Tympanic  SpO2: 100%  Weight: 139 lb 3.2 oz (63.1 kg)   Physical Exam Constitutional:      General: She is not in acute distress. Cardiovascular:     Rate and Rhythm: Normal rate and regular rhythm.     Heart sounds: Normal heart sounds.  Pulmonary:     Effort: Pulmonary effort is normal.     Breath sounds: Normal breath sounds.  Abdominal:     General: Bowel sounds are normal.     Palpations: Abdomen is soft.  Skin:    General: Skin is warm and dry.  Neurological:     Mental Status: She is alert and oriented to person, place, and time.      CMP Latest Ref Rng & Units 03/21/2021  Glucose 70 - 99 mg/dL 96  BUN 8 - 23 mg/dL 11  Creatinine 0.44 - 1.00 mg/dL 0.72  Sodium 135 - 145 mmol/L 137  Potassium 3.5 - 5.1 mmol/L 3.5  Chloride 98 - 111 mmol/L 104  CO2 22 - 32 mmol/L 26  Calcium 8.9 - 10.3 mg/dL 9.0  Total Protein 6.5 - 8.1  g/dL 8.0  Total Bilirubin 0.3 - 1.2 mg/dL 0.3  Alkaline Phos 38 - 126 U/L 63  AST 15 - 41 U/L 12(L)  ALT 0 - 44 U/L 7   CBC Latest Ref Rng & Units 03/27/2021  WBC 4.0 - 10.5 K/uL 8.8  Hemoglobin 12.0 - 15.0 g/dL 8.4(L)  Hematocrit 36.0 - 46.0 % 28.4(L)  Platelets 150 - 400 K/uL 406(H)    No images are attached to the encounter.  CT CHEST ABDOMEN PELVIS W CONTRAST  Result Date: 04/23/2021 CLINICAL DATA:  Iron deficiency anemia. Fatigue. Mid abdominal pain for 2 weeks. EXAM: CT CHEST, ABDOMEN, AND PELVIS WITH CONTRAST TECHNIQUE: Multidetector CT imaging of the chest, abdomen and pelvis was performed following the standard protocol during bolus administration of intravenous contrast. CONTRAST:  122mL ISOVUE-300 IOPAMIDOL (ISOVUE-300) INJECTION 61% COMPARISON:  None. FINDINGS: CT CHEST FINDINGS Cardiovascular: Heart size upper normal. Trace pericardial fluid.  No thoracic aortic aneurysm. Mediastinum/Nodes: No mediastinal lymphadenopathy. No evidence for gross hilar lymphadenopathy although assessment is limited by the lack of intravenous contrast on today's study. The esophagus has normal imaging features. Upper normal lymph nodes identified left axilla. Lungs/Pleura: No suspicious pulmonary nodule or mass. Subsegmental atelectasis is noted in the lung bases. No pleural effusion. Musculoskeletal: No worrisome lytic or sclerotic osseous abnormality. CT ABDOMEN PELVIS FINDINGS Hepatobiliary: 6.4 cm heterogeneously enhancing mass is identified in the posterior right liver. Just cranial to this lesion is a second right hepatic lobe mass measuring 4.0 cm. There is no evidence for gallstones, gallbladder wall thickening, or pericholecystic fluid. No intrahepatic or extrahepatic biliary dilation. Pancreas: No focal mass lesion. No dilatation of the main duct. No intraparenchymal cyst. No peripancreatic edema. Spleen: No splenomegaly. No focal mass lesion. Adrenals/Urinary Tract: No adrenal nodule or mass. Right  kidney unremarkable. Small cyst noted lower pole left kidney. No hydroureteronephrosis. The urinary bladder appears normal for the degree of distention. Stomach/Bowel: Stomach is unremarkable. No gastric wall thickening. No evidence of outlet obstruction. Duodenum is normally positioned as is the ligament of Treitz. No small bowel wall thickening. No small bowel dilatation. The terminal ileum is normal. The appendix is normal. Large volume stool noted right colon transverse colon, and descending colon. Irregular mass lesion noted in the distal sigmoid colon extending into the rectosigmoid junction with lateral extra colonic extension into the pelvic sidewall. There is apparent luminal narrowing at the level of the lesion which likely accounts for the large stool volume more proximally. Multiple pericolonic and perirectal lymph nodes are evident in there is a tubular low-density structure tracking cranially in the presacral space along the inferior mesenteric distribution compatible with a thrombosed vein. Multiple peritoneal nodules are seen on the low right pelvic sidewall. The colon mass appears to directly invade the posterior uterus on sagittal imaging. Vascular/Lymphatic: No abdominal aortic aneurysm. There is no gastrohepatic or hepatoduodenal ligament lymphadenopathy. No retroperitoneal or mesenteric lymphadenopathy. As described above, there is pericolonic lymphadenopathy around these distal sigmoid colon and abnormal perirectal lymph nodes. Apparent thrombosis of the superior rectal vein may be bland thrombus although direct tumor invasion is not excluded. Reproductive: As above, distal colonic mass appears to directly invade the posterior uterus. Left ovary not discretely visible. Right ovary is probably just posterior to the cecum on image 106/2, unremarkable. Other: No substantial intraperitoneal free fluid. Musculoskeletal: No worrisome lytic or sclerotic osseous abnormality. IMPRESSION: 1. Large mass  lesion involving the distal sigmoid colon extending into the rectosigmoid junction and upper rectum, compatible with colorectal neoplasm. Abnormal rim enhancing soft tissue extends directly from this lesion into the left pelvic sidewall suggesting transmural tumor extension and pelvic sidewall invasion. Multiple abnormal lymph nodes are seen around the distal sigmoid colon and rectum, concerning for metastatic disease. There is thrombosis of the superior rectal vein in the same region which may reflect bland thrombus or direct tumor extension. 2. Large heterogeneously enhancing right liver lesions consistent with metastatic disease. 3. Distal colonic mass appears to directly invade the posterior uterus. 4. Large stool volume in the colon proximal to the distal sigmoid lesion. The sigmoid lesion does appear to narrow the lumen of the colon and the prominent stool volume proximal to the lesion is compatible with significant neoplastic stricture. 5. Above described imaging features are felt to be most consistent with neoplastic etiology. While an infectious/inflammatory etiology with abscesses in the liver could potentially create a similar appearance, this is felt to be less  likely given the absence of signs/symptoms of infection. I personally called these results directly to Dr. Janese Banks at 1306 hours on 04/23/2021. Electronically Signed   By: Misty Stanley M.D.   On: 04/23/2021 13:09     Assessment and plan- Patient is a 65 y.o. female referred for iron deficiency anemia found to have metastatic colon cancer here to discuss further management  I have reviewed CT abdomen pelvis images independently and discussed findings with the patient.  Patient found to have mass in the rectosigmoid extending to the pelvic sidewall and involving the posterior wall of the uterus.  She also has multiple intra-abdominal lymph nodes peritoneal nodules as well as 2 liver lesions concerning for metastatic disease.  She had a  sigmoidoscopy yesterday and biopsies consistent with adenocarcinoma.  Discussed with the patient that this means she has stage IV disease and that treatment will be palliative and not curative.  Given that the mass is near obstructing and scope could not be traversed beyond the primary mass I would recommend a palliative colostomy for her to prevent future obstruction.  Patient will be seeing Dr. Dahlia Byes next week and go through the procedure next week.  During the procedure she will also get a port placement  Discussed the need for systemic chemotherapy and I would recommend FOLFOX chemotherapy given IV every 2 weeks until progression or toxicity.  After 4-6 treatments I will plan to add a Avastin once the obstruction is relieved and colostomy is well-healed.  Discussed risks and benefits of FOLFOX including all but not limited to nausea vomiting low blood counts, risk of infections and hospitalization.  Risk of peripheral neuropathy associated with oxaliplatin.  Patient understands and agrees to proceed as planned  I will tentatively see her back in 3 weeks to start chemotherapy after she is healed from her colostomy.  Also check ferritin and iron studies B12 and folate.  She has received 5 doses of Venofer already.  Patient will need a CT chest without contrast to complete her staging work-up  Cancer Staging Metastatic colon cancer in female [facility]) Staging form: Colon and Rectum, AJCC 8th Edition - Clinical stage from 04/25/2021: Stage IVC (cT4b, cN2, cM1c) - Signed by Sindy Guadeloupe, MD on 04/25/2021 Stage prefix: Initial diagnosis     Visit Diagnosis 1. Metastatic colon cancer in female [facility])   2. Goals of care, counseling/discussion      Dr. Randa Evens, MD, MPH [facility] at [facility] [phone] 04/25/2021 8:44 AM

## 2021-04-25 NOTE — Progress Notes (Signed)
START ON PATHWAY REGIMEN - Colorectal     A cycle is every 14 days:     Bevacizumab-xxxx      Oxaliplatin      Leucovorin      Fluorouracil      Fluorouracil   **Always confirm dose/schedule in your pharmacy ordering system**  Patient Characteristics: Distant Metastases, Nonsurgical Candidate, KRAS/NRAS Mutation Positive/Unknown (BRAF V600 Wild-Type/Unknown), Standard Cytotoxic Therapy, First Line Standard Cytotoxic Therapy, Bevacizumab Eligible, PS = 0,1 Tumor Location: Colon Therapeutic Status: Distant Metastases Microsatellite/Mismatch Repair Status: Unknown BRAF Mutation Status: Awaiting Test Results KRAS/NRAS Mutation Status: Awaiting Test Results Standard Cytotoxic Line of Therapy: First Line Standard Cytotoxic Therapy ECOG Performance Status: 1 Bevacizumab Eligibility: Eligible Intent of Therapy: Non-Curative / Palliative Intent, Discussed with Patient 

## 2021-04-29 ENCOUNTER — Other Ambulatory Visit: Payer: Self-pay

## 2021-04-29 ENCOUNTER — Encounter
Admission: RE | Admit: 2021-04-29 | Discharge: 2021-04-29 | Disposition: A | Discharge status: Home / Self Care | Payer: BC Managed Care – PPO | Source: Ambulatory Visit | Attending: Surgery | Admitting: Surgery

## 2021-04-29 DIAGNOSIS — Z01812 Encounter for preprocedural laboratory examination: Secondary | ICD-10-CM | POA: Insufficient documentation

## 2021-04-29 LAB — HEMOGLOBIN AND HEMATOCRIT, BLOOD
HCT: 29.6 % — ABNORMAL LOW (ref 36.0–46.0)
Hemoglobin: 9.1 g/dL — ABNORMAL LOW (ref 12.0–15.0)

## 2021-04-29 NOTE — Patient Instructions (Signed)
Your procedure is scheduled on: 05/01/21 Report to [facility]. To find out your arrival time please call [phone] between 1PM - 3PM on 04/30/21.  Remember: Instructions that are not followed completely may result in serious medical risk, up to and including death, or upon the discretion of your surgeon and anesthesiologist your surgery may need to be rescheduled.     _X__ 1.  Follow diet as instructed by Dr Corlis Leak staff                   __X__2.  On the morning of surgery brush your teeth with toothpaste and water, you                 may rinse your mouth with mouthwash if you wish.  Do not swallow any              toothpaste of mouthwash.     _X__ 3.  No Alcohol for 24 hours before or after surgery.   _X__ 4.  Do Not Smoke or use e-cigarettes For 24 Hours Prior to Your Surgery.                 Do not use any chewable tobacco products for at least 6 hours prior to                 surgery.  ____  5.  Bring all medications with you on the day of surgery if instructed.   __X__  6.  Notify your doctor if there is any change in your medical condition      (cold, fever, infections).     Do not wear jewelry, make-up, hairpins, clips or nail polish. Do not wear lotions, powders, or perfumes.  Do not shave 48 hours prior to surgery. Men may shave face and neck. Do not bring valuables to the hospital.    [facility] is not responsible for any belongings or valuables.  Contacts, dentures/partials or body piercings may not be worn into surgery. Bring a case for your contacts, glasses or hearing aids, a denture cup will be supplied. Leave your suitcase in the car. After surgery it may be brought to your room. For patients admitted to the hospital, discharge time is determined by your treatment team.   Patients discharged the day of surgery will not be allowed to drive home.   Please read over the following fact sheets that you were  given:   MRSA Information, CHG soap  __X__ Take these medicines the morning of surgery with A SIP OF WATER:    1. none  2.   3.   4.  5.  6.  ____ Fleet Enema (as directed)   __X__ Use CHG Soap/SAGE wipes as directed  ____ Use inhalers on the day of surgery  ____ Stop metformin/Janumet/Farxiga 2 days prior to surgery    ____ Take 1/2 of usual insulin dose the night before surgery. No insulin the morning          of surgery.   ____ Stop Blood Thinners Coumadin/Plavix/Xarelto/Pleta/Pradaxa/Eliquis/Effient/Aspirin  on   Or contact your Surgeon, Cardiologist or Medical Doctor regarding  ability to stop your blood thinners  __X__ Stop Anti-inflammatories 7 days before surgery such as Advil, Ibuprofen, Motrin,  BC or Goodies Powder, Naprosyn, Naproxen, Aleve, Aspirin    __X__ Stop all herbal supplements, fish oil or vitamin E until after surgery.    ____ Bring C-Pap to  the hospital.

## 2021-04-30 ENCOUNTER — Ambulatory Visit: Payer: Self-pay | Admitting: Surgery

## 2021-04-30 ENCOUNTER — Encounter: Payer: Self-pay | Admitting: Surgery

## 2021-04-30 ENCOUNTER — Ambulatory Visit (INDEPENDENT_AMBULATORY_CARE_PROVIDER_SITE_OTHER): Payer: BC Managed Care – PPO | Admitting: Surgery

## 2021-04-30 VITALS — BP 115/67 | HR 96 | Temp 98.7°F | Ht 66.0 in | Wt 133.0 lb

## 2021-04-30 DIAGNOSIS — C189 Malignant neoplasm of colon, unspecified: Secondary | ICD-10-CM

## 2021-04-30 NOTE — Progress Notes (Signed)
Patient ID: Alicia Coleman, female   DOB: [birth year], 65 y.o.   MRN: [id number]  HPI Alicia Coleman is a 65 y.o. female seen in consultation at the request of Dr. Janese Banks.  Case discussed extensively with her and with Dr. Vicente Males.  Initially came to the emergency room little bit over a month ago with failure to thrive weakness found to have severe anemia requiring blood transfusion.  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  There went flex sig by Dr. Vicente Males and I have personally reviewed the images showing near obstructing colon cancer. Latest hemoglobin is 9.1 from yesterday.  Fortunately she has had several compliant issues.  She was supposed to be here at 3:00 and showed up close to 5 PM.  She also missed the appointment for COVID test.  She has issues with fungal reception and being compliant.  She is accompanied by her sister who is very supportive.  They both understand the disease process. CMP a month ago revealed an albumin of 3.0 and the rest of the labs were within normal limit.  She is able to perform more than 4 METS of activity without any shortness of breath or chest pain.  HPI  Past Medical History:  Diagnosis Date  . Allergy   . Anemia     Past Surgical History:  Procedure Laterality Date  . FLEXIBLE SIGMOIDOSCOPY N/A 04/24/2021   Procedure: FLEXIBLE SIGMOIDOSCOPY;  Surgeon: [name], MD;  Location: [facility] ENDOSCOPY;  Service: Gastroenterology;  Laterality: N/A;    Family History  Problem Relation Age of Onset  . Stroke Mother   . Hypertension Mother   . Cancer Father     Social History Social History   Tobacco Use  . Smoking status: Never Smoker  . Smokeless tobacco: Never Used  Vaping Use  . Vaping Use: Never used  Substance Use Topics  . Alcohol use: Not Currently  . Drug use: Never    No Known Allergies  Current Outpatient  Medications  Medication Sig Dispense Refill  . dexamethasone (DECADRON) 4 MG tablet Take 2 tablets (8 mg total) by mouth daily. Start the day after chemotherapy for 2 days. Take with food. 30 tablet 1  . lidocaine-prilocaine (EMLA) cream Apply to affected area once 30 g 3  . LORazepam (ATIVAN) 0.5 MG tablet Take 1 tablet (0.5 mg total) by mouth every 6 (six) hours as needed (Nausea or vomiting). 30 tablet 0  . ondansetron (ZOFRAN) 8 MG tablet Take 1 tablet (8 mg total) by mouth 2 (two) times daily as needed for refractory nausea / vomiting. Start on day 3 after chemotherapy. 30 tablet 1  . prochlorperazine (COMPAZINE) 10 MG tablet Take 1 tablet (10 mg total) by mouth every 6 (six) hours as needed (Nausea or vomiting). 30 tablet 1   No current facility-administered medications for this visit.     Review of Systems Full ROS  was asked and was negative except for the information on the HPI  Physical Exam Blood pressure 115/67, pulse 96, temperature 98.7 F (37.1 C), temperature source Oral, height 5\' 6"  (1.676 m), weight 133 lb (60.3 kg), SpO2 96 %. CONSTITUTIONAL: NAD, malnourished  EYES: Pupils are equal, round, and reactive to light, Sclera are non-icteric. EARS, NOSE, MOUTH AND THROAT: The oropharynx is clear. The oral mucosa is pink and moist. Hearing is intact to voice. LYMPH  NODES:  Lymph nodes in the neck are normal. RESPIRATORY:  Lungs are clear. There is normal respiratory effort, with equal breath sounds bilaterally, and without pathologic use of accessory muscles. CARDIOVASCULAR: Heart is regular without murmurs, gallops, or rubs. GI: The abdomen is  soft, nontender, and nondistended. There are no palpable masses. There is no hepatosplenomegaly. There are normal bowel sounds in all quadrants. GU: Rectal deferred.   MUSCULOSKELETAL: Normal muscle strength and tone. No cyanosis or edema.   SKIN: Turgor is good and there are no pathologic skin lesions or ulcers. NEUROLOGIC: Motor  and sensation is grossly normal. Cranial nerves are grossly intact. PSYCH:  Oriented to person, place and time. Affect is normal.  Data Reviewed  I have personally reviewed the patient's imaging, laboratory findings and medical records.    Assessment/Plan 65 year old female with recently diagnosed metastatic colon cancer with near obstructing disease with sacral involvement.  I had discussion with patient regarding her disease process.  We have percent her ambulatory disciplinary conference.  We really have a diagnosis of metastatic colon cancer.  Next steps will be to perform a palliative diverting colostomy as well is a poor placement.  She is scheduled to have this performed tomorrow.  Procedure discussed with the patient in detail.  Risk, benefits and possible implications including but not limited to: Bleeding, infection, bowel injuries, chronic pain.  SHe understands and wished to proceed. She will get covid test tomorrow and she knows to be there at 630 am  Caroleen Hamman, MD [place] [facility] 04/30/2021, 5:17 PM

## 2021-04-30 NOTE — H&P (View-Only) (Signed)
Patient ID: Alicia Coleman, female   DOB: Feb 06, 1956, 65 y.o.   MRN: [id number]  HPI Alicia Coleman is a 65 y.o. female seen in consultation at the request of Dr. Janese Banks.  Case discussed extensively with her and with Dr. Vicente Males.  Initially came to the emergency room little bit over a month ago with failure to thrive weakness found to have severe anemia requiring blood transfusion.  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  There went flex sig by Dr. Vicente Males and I have personally reviewed the images showing near obstructing colon cancer. Latest hemoglobin is 9.1 from yesterday.  Fortunately she has had several compliant issues.  She was supposed to be here at 3:00 and showed up close to 5 PM.  She also missed the appointment for COVID test.  She has issues with fungal reception and being compliant.  She is accompanied by her sister who is very supportive.  They both understand the disease process. CMP a month ago revealed an albumin of 3.0 and the rest of the labs were within normal limit.  She is able to perform more than 4 METS of activity without any shortness of breath or chest pain.  HPI  Past Medical History:  Diagnosis Date  . Allergy   . Anemia     Past Surgical History:  Procedure Laterality Date  . FLEXIBLE SIGMOIDOSCOPY N/A 04/24/2021   Procedure: FLEXIBLE SIGMOIDOSCOPY;  Surgeon: [name], MD;  Location: [facility] ENDOSCOPY;  Service: Gastroenterology;  Laterality: N/A;    Family History  Problem Relation Age of Onset  . Stroke Mother   . Hypertension Mother   . Cancer Father     Social History Social History   Tobacco Use  . Smoking status: Never Smoker  . Smokeless tobacco: Never Used  Vaping Use  . Vaping Use: Never used  Substance Use Topics  . Alcohol use: Not Currently  . Drug use: Never    No Known Allergies  Current Outpatient  Medications  Medication Sig Dispense Refill  . dexamethasone (DECADRON) 4 MG tablet Take 2 tablets (8 mg total) by mouth daily. Start the day after chemotherapy for 2 days. Take with food. 30 tablet 1  . lidocaine-prilocaine (EMLA) cream Apply to affected area once 30 g 3  . LORazepam (ATIVAN) 0.5 MG tablet Take 1 tablet (0.5 mg total) by mouth every 6 (six) hours as needed (Nausea or vomiting). 30 tablet 0  . ondansetron (ZOFRAN) 8 MG tablet Take 1 tablet (8 mg total) by mouth 2 (two) times daily as needed for refractory nausea / vomiting. Start on day 3 after chemotherapy. 30 tablet 1  . prochlorperazine (COMPAZINE) 10 MG tablet Take 1 tablet (10 mg total) by mouth every 6 (six) hours as needed (Nausea or vomiting). 30 tablet 1   No current facility-administered medications for this visit.     Review of Systems Full ROS  was asked and was negative except for the information on the HPI  Physical Exam Blood pressure 115/67, pulse 96, temperature 98.7 F (37.1 C), temperature source Oral, height 5\' 6"  (1.676 m), weight 133 lb (60.3 kg), SpO2 96 %. CONSTITUTIONAL: NAD, malnourished  EYES: Pupils are equal, round, and reactive to light, Sclera are non-icteric. EARS, NOSE, MOUTH AND THROAT: The oropharynx is clear. The oral mucosa is pink and moist. Hearing is intact to voice. LYMPH  NODES:  Lymph nodes in the neck are normal. RESPIRATORY:  Lungs are clear. There is normal respiratory effort, with equal breath sounds bilaterally, and without pathologic use of accessory muscles. CARDIOVASCULAR: Heart is regular without murmurs, gallops, or rubs. GI: The abdomen is  soft, nontender, and nondistended. There are no palpable masses. There is no hepatosplenomegaly. There are normal bowel sounds in all quadrants. GU: Rectal deferred.   MUSCULOSKELETAL: Normal muscle strength and tone. No cyanosis or edema.   SKIN: Turgor is good and there are no pathologic skin lesions or ulcers. NEUROLOGIC: Motor  and sensation is grossly normal. Cranial nerves are grossly intact. PSYCH:  Oriented to person, place and time. Affect is normal.  Data Reviewed  I have personally reviewed the patient's imaging, laboratory findings and medical records.    Assessment/Plan 65 year old female with recently diagnosed metastatic colon cancer with near obstructing disease with sacral involvement.  I had discussion with patient regarding her disease process.  We have percent her ambulatory disciplinary conference.  We really have a diagnosis of metastatic colon cancer.  Next steps will be to perform a palliative diverting colostomy as well is a poor placement.  She is scheduled to have this performed tomorrow.  Procedure discussed with the patient in detail.  Risk, benefits and possible implications including but not limited to: Bleeding, infection, bowel injuries, chronic pain.  SHe understands and wished to proceed. She will get covid test tomorrow and she knows to be there at 630 am  Caroleen Hamman, MD [place] [facility]geon 04/30/2021, 5:17 PM

## 2021-04-30 NOTE — Patient Instructions (Signed)
If you have any concerns or questions, please feel free to call our office.     Colostomy Surgery, Adult  A colostomy is a surgical procedure that is done to attach part of the colon to the front of the abdomen (abdominal wall). The colon is the last part of the digestive tract. It is where water is absorbed from digested food to form stool (feces). A colostomy is done to redirect stool through an opening (stoma) in the abdominal wall. You may need this surgery if you have a medical condition that makes it difficult for stool to leave your body through the usual opening (rectum). A bag will be attached to the stoma on the outside of your body. This bag will collect the stool and waste that are redirected through the stoma. A colostomy may be temporary or permanent. Tell a health care provider about:  Any allergies you have.  All medicines you are taking, including vitamins, herbs, eye drops, creams, and over-the-counter medicines.  Any problems you or family members have had with anesthetic medicines.  Any blood disorders you have.  Any surgeries you have had.  Any medical conditions you have.  Whether you are pregnant or may be pregnant. What are the risks? Generally, this is a safe procedure. However, problems may occur, including:  Infection.  Bleeding.  Allergic reactions to medicines.  Damage to other structures or organs.  Leaking of stool inside the abdomen.  Formation of scar tissue that causes a blockage. What happens before the procedure? Medicines Ask your health care provider about:  Changing or stopping your regular medicines. This is especially important if you are taking diabetes medicines or blood thinners.  Taking medicines such as aspirin and ibuprofen. These medicines can thin your blood. Do not take these medicines unless your health care provider tells you to take them.  Taking over-the-counter medicines, vitamins, herbs, and supplements. Staying  hydrated Follow instructions from your health care provider about hydration, which may include:  Up to 2 hours before the procedure - you may continue to drink clear liquids, such as water, clear fruit juice, black coffee, and plain tea.   Eating and drinking restrictions Follow instructions from your health care provider about eating and drinking, which may include:  8 hours before the procedure - stop eating heavy meals or foods, such as meat, fried foods, or fatty foods.  6 hours before the procedure - stop eating light meals or foods, such as toast or cereal.  6 hours before the procedure - stop drinking milk or drinks that contain milk.  2 hours before the procedure - stop drinking clear liquids. General instructions  You may have an exam or testing.  Plan to have someone take you home from the hospital.  Plan to have a responsible adult care for you for at least 24 hours after you leave the hospital. This is important.  Do not use any products that contain nicotine or tobacco, such as cigarettes and e-cigarettes. If you need help quitting, ask your health care provider.  Ask your health care provider what steps will be taken to help prevent infection. These may include: ? Removing hair at the surgery site. ? Washing skin with a germ-killing soap. ? Antibiotic medicine.  Your health care team may examine you to determine the best place to create the stoma on your abdomen. What happens during the procedure?  An IV will be inserted into one of your veins.  A drainage tube may be passed  through your nose and into your stomach (nasogastric tube or NG tube).  You may be given: ? A medicine to help you relax (sedative). ? A medicine to make you fall asleep (general anesthetic).  An incision will be made in the front of your abdomen.  The muscles under the skin will be divided or separated.  The next steps will vary depending on the type of colostomy. There are two main  types: ? End colostomy. Part of the colon will be removed, or the colon will be divided into two separate parts. The end of the colon that is attached to the upper part of the digestive tract will be attached to the wall of the abdomen, creating a stoma. The other end will be closed off. ? Loop colostomy. A part of the colon will be pulled through the incision in the abdomen. An opening will be made in the side of the colon. The colon will be attached to the skin at this opening, creating the stoma. Waste will come from the part of the colon above the stoma. This will allow the rest of the colon below the stoma to rest and heal.  Stitches (sutures) will be used to create the stoma and close the incision.  A colostomy bag will be placed around the stoma to collect stool and mucus. The procedure may vary among health care providers and hospitals. What happens after the procedure?  Your blood pressure, heart rate, breathing rate, and blood oxygen level will be monitored until you leave the hospital.  You will be given pain medicine as needed.  You will receive fluids and nutrition through an IV.  As soon as you are eating well and passing stool through the colostomy, the IV may be removed.  Do not drive until your health care provider approves.  While you are in the hospital, a health care provider will teach you how to care for your colostomy at home. Summary  Colostomy surgery is done to attach part of the colon to the front of the abdomen so that stool can be redirected through an opening (stoma) in the abdominal wall. A colostomy may be temporary or permanent.  Before the procedure, follow instructions from your health care provider about taking medicines and about eating and drinking.  Plan to have someone take you home from the hospital. Plan to have a responsible adult care for you for at least 24 hours after you leave the hospital.  During surgery, a colostomy bag will be placed  around the stoma to collect stool and mucus.  While you are in the hospital, a health care provider will teach you how to care for your colostomy at home. This information is not intended to replace advice given to you by your health care provider. Make sure you discuss any questions you have with your health care provider. Document Revised: 03/15/2018 Document Reviewed: 03/15/2018 Elsevier Patient Education  [place].

## 2021-05-01 ENCOUNTER — Inpatient Hospital Stay: Payer: BC Managed Care – PPO | Admitting: Urgent Care

## 2021-05-01 ENCOUNTER — Inpatient Hospital Stay: Payer: BC Managed Care – PPO

## 2021-05-01 ENCOUNTER — Other Ambulatory Visit: Payer: Self-pay

## 2021-05-01 ENCOUNTER — Inpatient Hospital Stay
Admission: RE | Admit: 2021-05-01 | Discharge: 2021-05-02 | DRG: 331 | Disposition: A | Discharge status: Home / Self Care | Payer: BC Managed Care – PPO | Attending: Surgery | Admitting: Surgery

## 2021-05-01 ENCOUNTER — Encounter: Payer: Self-pay | Admitting: Surgery

## 2021-05-01 ENCOUNTER — Other Ambulatory Visit: Payer: BC Managed Care – PPO

## 2021-05-01 ENCOUNTER — Encounter
Admission: RE | Disposition: A | Discharge status: Home / Self Care | Payer: Self-pay | Source: Home / Self Care | Attending: Surgery

## 2021-05-01 ENCOUNTER — Observation Stay: Payer: BC Managed Care – PPO

## 2021-05-01 DIAGNOSIS — Z95828 Presence of other vascular implants and grafts: Secondary | ICD-10-CM

## 2021-05-01 DIAGNOSIS — Z20822 Contact with and (suspected) exposure to covid-19: Secondary | ICD-10-CM | POA: Diagnosis present

## 2021-05-01 DIAGNOSIS — C785 Secondary malignant neoplasm of large intestine and rectum: Principal | ICD-10-CM | POA: Diagnosis present

## 2021-05-01 DIAGNOSIS — Z823 Family history of stroke: Secondary | ICD-10-CM | POA: Diagnosis not present

## 2021-05-01 DIAGNOSIS — Z79899 Other long term (current) drug therapy: Secondary | ICD-10-CM

## 2021-05-01 DIAGNOSIS — I998 Other disorder of circulatory system: Secondary | ICD-10-CM

## 2021-05-01 DIAGNOSIS — C188 Malignant neoplasm of overlapping sites of colon: Secondary | ICD-10-CM | POA: Diagnosis not present

## 2021-05-01 DIAGNOSIS — Z8249 Family history of ischemic heart disease and other diseases of the circulatory system: Secondary | ICD-10-CM | POA: Diagnosis not present

## 2021-05-01 DIAGNOSIS — C189 Malignant neoplasm of colon, unspecified: Secondary | ICD-10-CM | POA: Diagnosis present

## 2021-05-01 DIAGNOSIS — Z419 Encounter for procedure for purposes other than remedying health state, unspecified: Secondary | ICD-10-CM

## 2021-05-01 DIAGNOSIS — Z189 Retained foreign body fragments, unspecified material: Secondary | ICD-10-CM

## 2021-05-01 HISTORY — PX: PORTACATH PLACEMENT: SHX2246

## 2021-05-01 LAB — CBC
HCT: 27.8 % — ABNORMAL LOW (ref 36.0–46.0)
Hemoglobin: 8.7 g/dL — ABNORMAL LOW (ref 12.0–15.0)
MCH: 23.1 pg — ABNORMAL LOW (ref 26.0–34.0)
MCHC: 31.3 g/dL (ref 30.0–36.0)
MCV: 73.9 fL — ABNORMAL LOW (ref 80.0–100.0)
Platelets: 274 10*3/uL (ref 150–400)
RBC: 3.76 MIL/uL — ABNORMAL LOW (ref 3.87–5.11)
RDW: 26.5 % — ABNORMAL HIGH (ref 11.5–15.5)
WBC: 11.3 10*3/uL — ABNORMAL HIGH (ref 4.0–10.5)
nRBC: 0 % (ref 0.0–0.2)

## 2021-05-01 LAB — CREATININE, SERUM
Creatinine, Ser: 0.83 mg/dL (ref 0.44–1.00)
GFR, Estimated: >60 mL/min (ref >60)

## 2021-05-01 LAB — SARS CORONAVIRUS 2 BY RT PCR (HOSPITAL ORDER, PERFORMED IN ~~LOC~~ HOSPITAL LAB): SARS Coronavirus 2: NEGATIVE

## 2021-05-01 LAB — TYPE AND SCREEN
ABO/RH(D): O POS
Antibody Screen: NEGATIVE

## 2021-05-01 LAB — HIV ANTIBODY (ROUTINE TESTING W REFLEX): HIV Screen 4th Generation wRfx: NONREACTIVE

## 2021-05-01 SURGERY — COLECTOMY, SIGMOID, ROBOT-ASSISTED
Anesthesia: General

## 2021-05-01 MED ORDER — ACETAMINOPHEN 160 MG/5ML PO SOLN
325.0000 mg | ORAL | Status: DC | PRN
  Filled 2021-05-01: qty 20.3

## 2021-05-01 MED ORDER — DIPHENHYDRAMINE HCL 12.5 MG/5ML PO ELIX
12.5000 mg | ORAL_SOLUTION | Freq: Four times a day (QID) | ORAL | Status: DC | PRN
  Filled 2021-05-01: qty 5

## 2021-05-01 MED ORDER — HYDROCODONE-ACETAMINOPHEN 7.5-325 MG PO TABS: 1.0000 | ORAL_TABLET | Freq: Once | ORAL | Status: DC | PRN

## 2021-05-01 MED ORDER — PREGABALIN 75 MG PO CAPS
75.0000 mg | ORAL_CAPSULE | Freq: Three times a day (TID) | ORAL | Status: DC
  Administered 2021-05-01 – 2021-05-02 (×2): 75 mg via ORAL
  Filled 2021-05-01 (×2): qty 1

## 2021-05-01 MED ORDER — DROPERIDOL 2.5 MG/ML IJ SOLN
0.6250 mg | Freq: Once | INTRAMUSCULAR | Status: DC | PRN
  Filled 2021-05-01: qty 2

## 2021-05-01 MED ORDER — OXYCODONE HCL 5 MG PO TABS: 5.0000 mg | ORAL_TABLET | ORAL | Status: DC | PRN

## 2021-05-01 MED ORDER — HEPARIN SODIUM (PORCINE) 5000 UNIT/ML IJ SOLN: 5000.0000 [IU] | Freq: Once | INTRAMUSCULAR | Status: AC

## 2021-05-01 MED ORDER — CHLORHEXIDINE GLUCONATE 0.12 % MT SOLN
OROMUCOSAL | Status: AC
  Administered 2021-05-01: 15 mL via OROMUCOSAL
  Filled 2021-05-01: qty 15

## 2021-05-01 MED ORDER — SODIUM CHLORIDE 0.9 % IV SOLN
2.0000 g | Freq: Three times a day (TID) | INTRAVENOUS | Status: DC
  Filled 2021-05-01 (×3): qty 2

## 2021-05-01 MED ORDER — SODIUM CHLORIDE 0.9 % IV SOLN
2.0000 g | Freq: Three times a day (TID) | INTRAVENOUS | Status: DC
  Administered 2021-05-01 – 2021-05-02 (×2): 2 g via INTRAVENOUS
  Filled 2021-05-01 (×3): qty 2

## 2021-05-01 MED ORDER — MIDAZOLAM HCL 2 MG/2ML IJ SOLN
INTRAMUSCULAR | Status: DC | PRN
  Administered 2021-05-01: 2 mg via INTRAVENOUS

## 2021-05-01 MED ORDER — GABAPENTIN 300 MG PO CAPS: 300.0000 mg | ORAL_CAPSULE | ORAL | Status: AC

## 2021-05-01 MED ORDER — MEPERIDINE HCL 25 MG/ML IJ SOLN: 6.2500 mg | INTRAMUSCULAR | Status: DC | PRN

## 2021-05-01 MED ORDER — DEXMEDETOMIDINE (PRECEDEX) IN NS 20 MCG/5ML (4 MCG/ML) IV SYRINGE
PREFILLED_SYRINGE | INTRAVENOUS | Status: AC
  Filled 2021-05-01: qty 5

## 2021-05-01 MED ORDER — CHLORHEXIDINE GLUCONATE CLOTH 2 % EX PADS: 6.0000 | MEDICATED_PAD | Freq: Once | CUTANEOUS | Status: DC

## 2021-05-01 MED ORDER — LORAZEPAM 0.5 MG PO TABS: 0.5000 mg | ORAL_TABLET | Freq: Four times a day (QID) | ORAL | Status: DC | PRN

## 2021-05-01 MED ORDER — ONDANSETRON 4 MG PO TBDP: 4.0000 mg | ORAL_TABLET | Freq: Four times a day (QID) | ORAL | Status: DC | PRN

## 2021-05-01 MED ORDER — ACETAMINOPHEN 500 MG PO TABS: 1000.0000 mg | ORAL_TABLET | ORAL | Status: AC

## 2021-05-01 MED ORDER — ONDANSETRON HCL 4 MG/2ML IJ SOLN
INTRAMUSCULAR | Status: AC
  Filled 2021-05-01: qty 2

## 2021-05-01 MED ORDER — ACETAMINOPHEN 500 MG PO TABS
1000.0000 mg | ORAL_TABLET | Freq: Four times a day (QID) | ORAL | Status: DC
  Administered 2021-05-01 – 2021-05-02 (×4): 1000 mg via ORAL
  Filled 2021-05-01 (×4): qty 2

## 2021-05-01 MED ORDER — ACETAMINOPHEN 10 MG/ML IV SOLN
INTRAVENOUS | Status: DC | PRN
  Administered 2021-05-01: 1000 mg via INTRAVENOUS

## 2021-05-01 MED ORDER — BUPIVACAINE-EPINEPHRINE (PF) 0.25% -1:200000 IJ SOLN
INTRAMUSCULAR | Status: DC | PRN
  Administered 2021-05-01: 30 mL

## 2021-05-01 MED ORDER — SODIUM CHLORIDE 0.9 % IV SOLN
INTRAVENOUS | Status: AC
  Filled 2021-05-01: qty 2

## 2021-05-01 MED ORDER — ONDANSETRON HCL 4 MG/2ML IJ SOLN: 4.0000 mg | Freq: Four times a day (QID) | INTRAMUSCULAR | Status: DC | PRN

## 2021-05-01 MED ORDER — LACTATED RINGERS IV SOLN: INTRAVENOUS | Status: DC

## 2021-05-01 MED ORDER — LIDOCAINE HCL (CARDIAC) PF 100 MG/5ML IV SOSY
PREFILLED_SYRINGE | INTRAVENOUS | Status: DC | PRN
  Administered 2021-05-01 (×3): 20 mg via INTRAVENOUS
  Administered 2021-05-01: 100 mg via INTRAVENOUS
  Administered 2021-05-01: 20 mg via INTRAVENOUS

## 2021-05-01 MED ORDER — SODIUM CHLORIDE 0.9 % IV SOLN
INTRAVENOUS | Status: DC | PRN
  Administered 2021-05-01: 30 ug/min via INTRAVENOUS

## 2021-05-01 MED ORDER — FENTANYL CITRATE (PF) 100 MCG/2ML IJ SOLN
INTRAMUSCULAR | Status: AC
  Filled 2021-05-01: qty 2

## 2021-05-01 MED ORDER — PROCHLORPERAZINE MALEATE 10 MG PO TABS
10.0000 mg | ORAL_TABLET | Freq: Four times a day (QID) | ORAL | Status: DC | PRN
  Filled 2021-05-01: qty 1

## 2021-05-01 MED ORDER — LIDOCAINE HCL (PF) 2 % IJ SOLN
INTRAMUSCULAR | Status: AC
  Filled 2021-05-01: qty 5

## 2021-05-01 MED ORDER — ORAL CARE MOUTH RINSE: 15.0000 mL | Freq: Once | OROMUCOSAL | Status: AC

## 2021-05-01 MED ORDER — CHLORHEXIDINE GLUCONATE 0.12 % MT SOLN: 15.0000 mL | Freq: Once | OROMUCOSAL | Status: AC

## 2021-05-01 MED ORDER — SODIUM CHLORIDE 0.9 % IV SOLN
2.0000 g | INTRAVENOUS | Status: AC
  Administered 2021-05-01 (×2): 2 g via INTRAVENOUS

## 2021-05-01 MED ORDER — FENTANYL CITRATE (PF) 100 MCG/2ML IJ SOLN
INTRAMUSCULAR | Status: DC | PRN
  Administered 2021-05-01 (×4): 50 ug via INTRAVENOUS

## 2021-05-01 MED ORDER — PROMETHAZINE HCL 25 MG/ML IJ SOLN
6.2500 mg | INTRAMUSCULAR | Status: DC | PRN
Start: 2021-05-01 — End: 2021-05-01

## 2021-05-01 MED ORDER — CELECOXIB 200 MG PO CAPS
ORAL_CAPSULE | ORAL | Status: AC
  Administered 2021-05-01: 200 mg via ORAL
  Filled 2021-05-01: qty 1

## 2021-05-01 MED ORDER — SEVOFLURANE IN SOLN
RESPIRATORY_TRACT | Status: AC
  Filled 2021-05-01: qty 250

## 2021-05-01 MED ORDER — GABAPENTIN 300 MG PO CAPS
ORAL_CAPSULE | ORAL | Status: AC
  Administered 2021-05-01: 300 mg via ORAL
  Filled 2021-05-01: qty 1

## 2021-05-01 MED ORDER — FAMOTIDINE 20 MG PO TABS
ORAL_TABLET | ORAL | Status: AC
  Administered 2021-05-01: 20 mg via ORAL
  Filled 2021-05-01: qty 1

## 2021-05-01 MED ORDER — CELECOXIB 200 MG PO CAPS: 200.0000 mg | ORAL_CAPSULE | ORAL | Status: AC

## 2021-05-01 MED ORDER — KETOROLAC TROMETHAMINE 30 MG/ML IJ SOLN: 30.0000 mg | Freq: Four times a day (QID) | INTRAMUSCULAR | Status: DC | PRN

## 2021-05-01 MED ORDER — DEXAMETHASONE SODIUM PHOSPHATE 10 MG/ML IJ SOLN
INTRAMUSCULAR | Status: AC
  Filled 2021-05-01: qty 1

## 2021-05-01 MED ORDER — KETOROLAC TROMETHAMINE 30 MG/ML IJ SOLN
30.0000 mg | Freq: Four times a day (QID) | INTRAMUSCULAR | Status: DC
  Administered 2021-05-01 – 2021-05-02 (×3): 30 mg via INTRAVENOUS
  Filled 2021-05-01 (×3): qty 1

## 2021-05-01 MED ORDER — DEXMEDETOMIDINE (PRECEDEX) IN NS 20 MCG/5ML (4 MCG/ML) IV SYRINGE
PREFILLED_SYRINGE | INTRAVENOUS | Status: DC | PRN
  Administered 2021-05-01 (×2): 4 ug via INTRAVENOUS
  Administered 2021-05-01: 8 ug via INTRAVENOUS
  Administered 2021-05-01: 4 ug via INTRAVENOUS

## 2021-05-01 MED ORDER — DIPHENHYDRAMINE HCL 50 MG/ML IJ SOLN: 12.5000 mg | Freq: Four times a day (QID) | INTRAMUSCULAR | Status: DC | PRN

## 2021-05-01 MED ORDER — ONDANSETRON HCL 4 MG/2ML IJ SOLN
INTRAMUSCULAR | Status: DC | PRN
  Administered 2021-05-01: 4 mg via INTRAVENOUS

## 2021-05-01 MED ORDER — SODIUM CHLORIDE 0.9 % IV SOLN
INTRAVENOUS | Status: DC | PRN
  Administered 2021-05-01: 1 mL via INTRAMUSCULAR

## 2021-05-01 MED ORDER — EPHEDRINE SULFATE 50 MG/ML IJ SOLN
INTRAMUSCULAR | Status: DC | PRN
  Administered 2021-05-01: 10 mg via INTRAVENOUS

## 2021-05-01 MED ORDER — FAMOTIDINE 20 MG PO TABS: 20.0000 mg | ORAL_TABLET | Freq: Once | ORAL | Status: AC

## 2021-05-01 MED ORDER — SODIUM CHLORIDE (PF) 0.9 % IJ SOLN
INTRAMUSCULAR | Status: AC
  Filled 2021-05-01: qty 50

## 2021-05-01 MED ORDER — GLYCOPYRROLATE 0.2 MG/ML IJ SOLN
INTRAMUSCULAR | Status: DC | PRN
  Administered 2021-05-01: .2 mg via INTRAVENOUS

## 2021-05-01 MED ORDER — PANTOPRAZOLE SODIUM 40 MG IV SOLR
40.0000 mg | Freq: Every day | INTRAVENOUS | Status: DC
  Administered 2021-05-01: 40 mg via INTRAVENOUS
  Filled 2021-05-01: qty 40

## 2021-05-01 MED ORDER — EPHEDRINE 5 MG/ML INJ
INTRAVENOUS | Status: AC
  Filled 2021-05-01: qty 10

## 2021-05-01 MED ORDER — MORPHINE SULFATE (PF) 2 MG/ML IV SOLN
2.0000 mg | INTRAVENOUS | Status: DC | PRN
Start: 2021-05-01 — End: 2021-05-02

## 2021-05-01 MED ORDER — ACETAMINOPHEN 10 MG/ML IV SOLN
INTRAVENOUS | Status: AC
  Filled 2021-05-01: qty 100

## 2021-05-01 MED ORDER — PHENYLEPHRINE HCL (PRESSORS) 10 MG/ML IV SOLN
INTRAVENOUS | Status: AC
  Filled 2021-05-01: qty 1

## 2021-05-01 MED ORDER — ACETAMINOPHEN 325 MG PO TABS
325.0000 mg | ORAL_TABLET | ORAL | Status: DC | PRN
Start: 2021-05-01 — End: 2021-05-01

## 2021-05-01 MED ORDER — PROCHLORPERAZINE EDISYLATE 10 MG/2ML IJ SOLN: 5.0000 mg | Freq: Four times a day (QID) | INTRAMUSCULAR | Status: DC | PRN

## 2021-05-01 MED ORDER — KETAMINE HCL 50 MG/ML IJ SOLN
INTRAMUSCULAR | Status: DC | PRN
  Administered 2021-05-01 (×2): 10 mg via INTRAMUSCULAR
  Administered 2021-05-01: 20 mg via INTRAMUSCULAR
  Administered 2021-05-01: 10 mg via INTRAMUSCULAR

## 2021-05-01 MED ORDER — ENOXAPARIN SODIUM 40 MG/0.4ML IJ SOSY
40.0000 mg | PREFILLED_SYRINGE | INTRAMUSCULAR | Status: DC
  Administered 2021-05-02: 40 mg via SUBCUTANEOUS
  Filled 2021-05-01: qty 0.4

## 2021-05-01 MED ORDER — PROPOFOL 10 MG/ML IV BOLUS
INTRAVENOUS | Status: DC | PRN
  Administered 2021-05-01: 100 mg via INTRAVENOUS

## 2021-05-01 MED ORDER — SUGAMMADEX SODIUM 200 MG/2ML IV SOLN
INTRAVENOUS | Status: DC | PRN
  Administered 2021-05-01: 200 mg via INTRAVENOUS

## 2021-05-01 MED ORDER — HEPARIN SODIUM (PORCINE) 5000 UNIT/ML IJ SOLN
INTRAMUSCULAR | Status: AC
  Administered 2021-05-01: 5000 [IU] via SUBCUTANEOUS
  Filled 2021-05-01: qty 1

## 2021-05-01 MED ORDER — ACETAMINOPHEN 500 MG PO TABS
ORAL_TABLET | ORAL | Status: AC
  Administered 2021-05-01: 1000 mg via ORAL
  Filled 2021-05-01: qty 2

## 2021-05-01 MED ORDER — FENTANYL CITRATE (PF) 100 MCG/2ML IJ SOLN: 25.0000 ug | INTRAMUSCULAR | Status: DC | PRN

## 2021-05-01 MED ORDER — SODIUM CHLORIDE 0.9 % IV SOLN
INTRAVENOUS | Status: DC | PRN
  Administered 2021-05-01: 70 mL

## 2021-05-01 MED ORDER — PROPOFOL 10 MG/ML IV BOLUS
INTRAVENOUS | Status: AC
  Filled 2021-05-01: qty 20

## 2021-05-01 MED ORDER — SODIUM CHLORIDE 0.9 % IV SOLN: INTRAVENOUS | Status: DC

## 2021-05-01 MED ORDER — ROCURONIUM BROMIDE 100 MG/10ML IV SOLN
INTRAVENOUS | Status: DC | PRN
  Administered 2021-05-01: 10 mg via INTRAVENOUS
  Administered 2021-05-01: 50 mg via INTRAVENOUS
  Administered 2021-05-01: 10 mg via INTRAVENOUS
  Administered 2021-05-01: 20 mg via INTRAVENOUS

## 2021-05-01 MED ORDER — MIDAZOLAM HCL 2 MG/2ML IJ SOLN
INTRAMUSCULAR | Status: AC
  Filled 2021-05-01: qty 2

## 2021-05-01 MED ORDER — DEXAMETHASONE SODIUM PHOSPHATE 10 MG/ML IJ SOLN
INTRAMUSCULAR | Status: DC | PRN
  Administered 2021-05-01: 10 mg via INTRAVENOUS

## 2021-05-01 MED ORDER — KETAMINE HCL 50 MG/ML IJ SOLN
INTRAMUSCULAR | Status: AC
  Filled 2021-05-01: qty 1

## 2021-05-01 SURGICAL SUPPLY — 123 items
ADHESIVE MASTISOL STRL (MISCELLANEOUS) ×4 IMPLANT
BAG DECANTER FOR FLEXI CONT (MISCELLANEOUS) ×2 IMPLANT
BAG LAPAROSCOPIC 12 15 PORT 16 (BASKET) IMPLANT
BAG RETRIEVAL 12/15 (BASKET)
BARRIER SKIN 2 3/4 (OSTOMY) ×2 IMPLANT
BLADE SURG SZ11 CARB STEEL (BLADE) ×2 IMPLANT
BOOT SUTURE AID YELLOW STND (SUTURE) ×2 IMPLANT
CANNULA REDUC XI 12-8 STAPL (CANNULA) ×1
CANNULA REDUCER 12-8 DVNC XI (CANNULA) ×1 IMPLANT
CATH ROBINSON RED A/P 16FR (CATHETERS) IMPLANT
CATH ROBINSON RED A/P 18FR (CATHETERS) ×2 IMPLANT
CHLORAPREP W/TINT 26 (MISCELLANEOUS) ×4 IMPLANT
CLIP VESOLOCK LG 6/CT PURPLE (CLIP) ×2 IMPLANT
CLIP VESOLOCK MED LG 6/CT (CLIP) ×2 IMPLANT
COVER LIGHT HANDLE STERIS (MISCELLANEOUS) ×8 IMPLANT
COVER MAYO STAND REUSABLE (DRAPES) ×2 IMPLANT
COVER WAND RF STERILE (DRAPES) ×2 IMPLANT
DECANTER SPIKE VIAL GLASS SM (MISCELLANEOUS) IMPLANT
DEFOGGER SCOPE WARMER CLEARIFY (MISCELLANEOUS) ×2 IMPLANT
DERMABOND ADVANCED (GAUZE/BANDAGES/DRESSINGS) ×3
DERMABOND ADVANCED .7 DNX12 (GAUZE/BANDAGES/DRESSINGS) ×3 IMPLANT
DRAPE 3/4 80X56 (DRAPES) ×2 IMPLANT
DRAPE ARM DVNC X/XI (DISPOSABLE) ×4 IMPLANT
DRAPE C-ARM XRAY 36X54 (DRAPES) ×2 IMPLANT
DRAPE COLUMN DVNC XI (DISPOSABLE) ×1 IMPLANT
DRAPE DA VINCI XI ARM (DISPOSABLE) ×4
DRAPE DA VINCI XI COLUMN (DISPOSABLE) ×1
DRAPE INCISE IOBAN 66X45 STRL (DRAPES) ×2 IMPLANT
DRAPE LEGGINS SURG 28X43 STRL (DRAPES) IMPLANT
DRAPE UNDER BUTTOCK W/FLU (DRAPES) IMPLANT
ELECT BLADE 6.5 EXT (BLADE) ×2 IMPLANT
ELECT CAUTERY BLADE 6.4 (BLADE) ×4 IMPLANT
ELECT REM PT RETURN 9FT ADLT (ELECTROSURGICAL) ×2
ELECTRODE REM PT RTRN 9FT ADLT (ELECTROSURGICAL) ×1 IMPLANT
GEL ULTRASOUND 20GR AQUASONIC (MISCELLANEOUS) ×2 IMPLANT
GLOVE SURG ENC MOIS LTX SZ7 (GLOVE) ×28 IMPLANT
GOWN STRL REUS W/ TWL LRG LVL3 (GOWN DISPOSABLE) ×8 IMPLANT
GOWN STRL REUS W/TWL LRG LVL3 (GOWN DISPOSABLE) ×8
GRASPER LAPSCPC 5X45 DSP (INSTRUMENTS) ×2 IMPLANT
GRASPER SUT TROCAR 14GX15 (MISCELLANEOUS) ×2 IMPLANT
HANDLE YANKAUER SUCT BULB TIP (MISCELLANEOUS) ×2 IMPLANT
IRRIGATION STRYKERFLOW (MISCELLANEOUS) IMPLANT
IRRIGATOR STRYKERFLOW (MISCELLANEOUS)
IV NS 1000ML (IV SOLUTION)
IV NS 1000ML BAXH (IV SOLUTION) IMPLANT
IV NS 500ML (IV SOLUTION) ×1
IV NS 500ML BAXH (IV SOLUTION) ×1 IMPLANT
KIT IMAGING PINPOINTPAQ (MISCELLANEOUS) IMPLANT
KIT PINK PAD W/HEAD ARE REST (MISCELLANEOUS) ×2
KIT PINK PAD W/HEAD ARM REST (MISCELLANEOUS) ×1 IMPLANT
KIT PORT POWER 8FR ISP CVUE (Port) ×2 IMPLANT
LABEL OR SOLS (LABEL) ×2 IMPLANT
MANIFOLD NEPTUNE II (INSTRUMENTS) ×2 IMPLANT
NEEDLE HYPO 22GX1.5 SAFETY (NEEDLE) ×2 IMPLANT
NS IRRIG 500ML POUR BTL (IV SOLUTION) ×2 IMPLANT
OBTURATOR OPTICAL STANDARD 8MM (TROCAR)
OBTURATOR OPTICAL STND 8 DVNC (TROCAR)
OBTURATOR OPTICALSTD 8 DVNC (TROCAR) IMPLANT
PACK COLON CLEAN CLOSURE (MISCELLANEOUS) IMPLANT
PACK LAP CHOLECYSTECTOMY (MISCELLANEOUS) ×2 IMPLANT
PACK PORT-A-CATH (MISCELLANEOUS) ×2 IMPLANT
PAD PREP 24X41 OB/GYN DISP (PERSONAL CARE ITEMS) IMPLANT
PENCIL ELECTRO HAND CTR (MISCELLANEOUS) ×4 IMPLANT
PORT ACCESS TROCAR AIRSEAL 5 (TROCAR) ×2 IMPLANT
RELOAD STAPLER 2.5X60 WHT DVNC (STAPLE) IMPLANT
RELOAD STAPLER 3.5X45 BLU DVNC (STAPLE) IMPLANT
RELOAD STAPLER 3.5X60 BLU DVNC (STAPLE) IMPLANT
SEAL CANN UNIV 5-8 DVNC XI (MISCELLANEOUS) ×3 IMPLANT
SEAL XI 5MM-8MM UNIVERSAL (MISCELLANEOUS) ×3
SEALER VESSEL DA VINCI XI (MISCELLANEOUS)
SEALER VESSEL EXT DVNC XI (MISCELLANEOUS) IMPLANT
SET TRI-LUMEN FLTR TB AIRSEAL (TUBING) ×2 IMPLANT
SOL PREP PVP 2OZ (MISCELLANEOUS)
SOLUTION ELECTROLUBE (MISCELLANEOUS) ×2 IMPLANT
SOLUTION PREP PVP 2OZ (MISCELLANEOUS) IMPLANT
SPONGE LAP 18X18 RF (DISPOSABLE) ×6 IMPLANT
SPONGE LAP 4X18 RFD (DISPOSABLE) ×2 IMPLANT
STAPLER 45 DA VINCI SURE FORM (STAPLE)
STAPLER 45 SUREFORM DVNC (STAPLE) IMPLANT
STAPLER 60 DA VINCI SURE FORM (STAPLE)
STAPLER 60 SUREFORM DVNC (STAPLE) IMPLANT
STAPLER CANNULA SEAL DVNC XI (STAPLE) ×1 IMPLANT
STAPLER CANNULA SEAL XI (STAPLE) ×1
STAPLER CIRCULAR MANUAL XL 25 (STAPLE) IMPLANT
STAPLER CIRCULAR MANUAL XL 29 (STAPLE) IMPLANT
STAPLER CIRCULAR MANUAL XL 33 (STAPLE) IMPLANT
STAPLER RELOAD 2.5X60 WHITE (STAPLE)
STAPLER RELOAD 2.5X60 WHT DVNC (STAPLE)
STAPLER RELOAD 3.5X45 BLU DVNC (STAPLE)
STAPLER RELOAD 3.5X45 BLUE (STAPLE)
STAPLER RELOAD 3.5X60 BLU DVNC (STAPLE)
STAPLER RELOAD 3.5X60 BLUE (STAPLE)
SURGILUBE 2OZ TUBE FLIPTOP (MISCELLANEOUS) ×2 IMPLANT
SUT DVC VLOC 3-0 CL 6 P-12 (SUTURE) ×2 IMPLANT
SUT MNCRL 4-0 (SUTURE) ×2
SUT MNCRL 4-0 27XMFL (SUTURE) ×2
SUT MNCRL AB 4-0 PS2 18 (SUTURE) ×2 IMPLANT
SUT PDS AB 0 CT1 27 (SUTURE) ×4 IMPLANT
SUT PROLENE 2-0 (SUTURE) ×1
SUT PROLENE 2-0 RB1 36X2 ARM (SUTURE) ×1
SUT SILK 2 0 (SUTURE) ×1
SUT SILK 2 0 SH (SUTURE) ×2 IMPLANT
SUT SILK 2 0SH CR/8 30 (SUTURE) ×2 IMPLANT
SUT SILK 2-0 30XBRD TIE 12 (SUTURE) ×1 IMPLANT
SUT VIC AB 0 UR5 27 (SUTURE) ×2 IMPLANT
SUT VIC AB 3-0 SH 27 (SUTURE) ×1
SUT VIC AB 3-0 SH 27X BRD (SUTURE) ×1 IMPLANT
SUT VICRYL 0 AB UR-6 (SUTURE) ×2 IMPLANT
SUT VICRYL 3-0 CR8 SH (SUTURE) ×2 IMPLANT
SUT VLOC 90 S/L VL9 GS22 (SUTURE) ×4 IMPLANT
SUTURE MNCRL 4-0 27XMF (SUTURE) ×2 IMPLANT
SUTURE PROLEN 2-0 RB1 36X2 ARM (SUTURE) ×1 IMPLANT
SYR 10ML LL (SYRINGE) ×2 IMPLANT
SYR 20ML LL LF (SYRINGE) ×4 IMPLANT
SYR 5ML LL (SYRINGE) ×2 IMPLANT
SYR TOOMEY IRRIG 70ML (MISCELLANEOUS)
SYRINGE TOOMEY IRRIG 70ML (MISCELLANEOUS) IMPLANT
SYS TROCAR 1.5-3 SLV ABD GEL (ENDOMECHANICALS) ×2
SYSTEM TROCR 1.5-3 SLV ABD GEL (ENDOMECHANICALS) ×1 IMPLANT
TAPE TRANSPORE STRL 2 31045 (GAUZE/BANDAGES/DRESSINGS) ×2 IMPLANT
TOWEL OR 17X26 4PK STRL BLUE (TOWEL DISPOSABLE) ×2 IMPLANT
TRAY FOLEY MTR SLVR 16FR STAT (SET/KITS/TRAYS/PACK) ×2 IMPLANT
TROCAR BALLN GELPORT 12X130M (ENDOMECHANICALS) ×2 IMPLANT

## 2021-05-01 NOTE — Transfer of Care (Signed)
Immediate Anesthesia Transfer of Care Note  Patient: Alicia Coleman  Procedure(s) Performed: XI ROBOT ASSISTED LOOP COLOSTOMY with RNFA to assist (N/A ) INSERTION PORT-A-CATH (N/A )  Patient Location: PACU  Anesthesia Type:General  Level of Consciousness: sedated  Airway & Oxygen Therapy: Patient Spontanous Breathing and Patient connected to face mask oxygen  Post-op Assessment: Report given to RN and Post -op Vital signs reviewed and stable  Post vital signs: Reviewed  Last Vitals:  Vitals Value Taken Time  BP 116/74 05/01/21 1521  Temp    Pulse 73 05/01/21 1522  Resp 17 05/01/21 1522  SpO2 100 % 05/01/21 1522  Vitals shown include unvalidated device data.  Last Pain:  Vitals:   05/01/21 0646  PainSc: 0-No pain         Complications: No complications documented.

## 2021-05-01 NOTE — Interval H&P Note (Signed)
History and Physical Interval Note:  05/01/2021 6:49 AM  Alicia Coleman  has presented today for surgery, with the diagnosis of mass sigmoid colon, iron deficiency anemia.  The various methods of treatment have been discussed with the patient and family. After consideration of risks, benefits and other options for treatment, the patient has consented to  Procedure(s): XI ROBOT ASSISTED LOOP COLOSTOMY with RNFA to assist (N/A) INSERTION PORT-A-CATH (N/A) as a surgical intervention.  The patient's history has been reviewed, patient examined, no change in status, stable for surgery.  I have reviewed the patient's chart and labs.  Questions were answered to the patient's satisfaction.     [place]

## 2021-05-01 NOTE — Anesthesia Procedure Notes (Signed)
Procedure Name: Intubation Date/Time: 05/01/2021 10:26 AM Performed by: Lily Peer, Delmi Fulfer, CRNA Pre-anesthesia Checklist: Patient identified, Emergency Drugs available, Suction available and Patient being monitored Patient Re-evaluated:Patient Re-evaluated prior to induction Oxygen Delivery Method: Circle system utilized Preoxygenation: Pre-oxygenation with 100% oxygen Induction Type: IV induction Ventilation: Mask ventilation without difficulty Laryngoscope Size: McGraph and 3 Grade View: Grade I Tube type: Oral Tube size: 7.0 mm Number of attempts: 1 Airway Equipment and Method: Stylet Placement Confirmation: ETT inserted through vocal cords under direct vision,  positive ETCO2 and breath sounds checked- equal and bilateral Secured at: 20 cm Tube secured with: Tape Dental Injury: Teeth and Oropharynx as per pre-operative assessment

## 2021-05-01 NOTE — Anesthesia Postprocedure Evaluation (Signed)
Anesthesia Post Note  Patient: Alicia Coleman  Procedure(s) Performed: XI ROBOT ASSISTED LOOP COLOSTOMY with RNFA to assist (N/A ) INSERTION PORT-A-CATH (N/A )  Patient location during evaluation: PACU Anesthesia Type: General Level of consciousness: awake and alert Pain management: pain level controlled Vital Signs Assessment: post-procedure vital signs reviewed and stable Respiratory status: spontaneous breathing and respiratory function stable Cardiovascular status: stable Anesthetic complications: no   No complications documented.   Last Vitals:  Vitals:   05/01/21 1545 05/01/21 1600  BP: 116/75 111/68  Pulse: 63 69  Resp: 17 10  Temp:  (!) 36.3 C  SpO2: 100% 100%    Last Pain:  Vitals:   05/01/21 1600  PainSc: 0-No pain                 Gervase Colberg K

## 2021-05-01 NOTE — Op Note (Signed)
PROCEDURES: 1. Placement of right IJ port under ultrasound and fluoroscopic guidance  2. Robotic assisted Laparoscopic loop colostomy 3. 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 ( required for the colostomy creation and for exposure)  Anesthesia: General endotracheal anesthesia  ASA Class: 2   Surgeon: Caroleen Hamman , MD FACS  Anesthesia: Gen. with endotracheal tube   Findings: Adhesions from omentum to the abdominal  wall Tension free loop descending colostomy Tip of the catheter appropriate position. Needle and laparotomy counts were correct, No evidence of retained FBs  Estimated Blood Loss: 20cc                      Complications: none         Condition: stable  Procedure Details  The patient was seen again in the Holding Room. The benefits, complications, treatment options, and expected outcomes were discussed with the patient. The risks of bleeding, infection, recurrence of symptoms, failure to resolve symptoms, anastomotic leak, bowel injury, any of which could require further surgery were reviewed with the patient.   The patient was taken to Operating Room, identified  and the procedure verified.  A Time Out was held and the above information confirmed.  Prior to the induction of general anesthesia, antibiotic prophylaxis was administered. VTE prophylaxis was in place. General endotracheal anesthesia was then administered and tolerated well. After the induction, the abdomen was prepped with Chloraprep and draped in the sterile fashion. The patient was positioned in the supine position.  Attention was first placed upper chest and neck this was prepped and draped in the usual sterile fashion.  Using ultrasound probe the right internal jugular vein was visualized and was cannulated on a single  stick.  Good dark venous return obtained that was nonpulsatile.  Guidewire was placed and the needle was removed.  We confirmed with ultrasound the appropriate position of the wire within the vessel.  We also brought a C arm to confirm the appropriate position of the wire.  A small incision was created upper right chest wall and subcu tissue was dissected with electrocautery.  We were able to tunnel the catheter within the subcutaneous tissue in the standard fashion.  A dilator was placed and the wire was removed.  The catheter was threaded via the vascular sheath in the standard fashion and using fluoroscopic guidance we confirmed that the tip of the catheter layed within the SVC right atrium junction.  Connected the port and aspirated in the standard fashion with heparinized saline.  2 Prolene sutures were used to secure the port to the chest wall.  The wound was closed in a 2 layer fashion with a 3-0 Vicryl for the dermis and 4-0 Monocryl for the skin.  Dermabond was applied.   Remove all the drapes from the chest and neck.  we prepped and draped the abdomen in the standard fashion, a 1.5 cm incision was created right lower quadrant. The abdominal cavity was entered under direct visualization  Via cutdown and stay sutures were placed on the fascia and pneumoperitoneum was obtained, no hemodynamic changes were apparent. . Three 8 mm robotic ports were placed under direct visualization  Patient was positioned in  trendelenburg and left side up. Robot was brought to the field and docked in the standard fashion. WE maintained visualization of our instruments at all times and avoided  any collision between arms. I scrubbed out and went to the console. There were dense adhesions from omentum to the abdominal wall that where lysed in the standard fashion with the scissors.  Mobilized the descending colon after incising the white line of [place] with scissors.  We were also able to score the superficial mesenteric  layer of the descending colon without compromising the vascular supply of the colon.  2 gain additional length and mobility we takedown the splenic flexure with scissors in the standard fashion.  I have visualized the pelvis and it was obvious that there was a mass that was adherent to the pelvic wall. Due to mobilization of the colon I was happy with the amount of length that we had.  A 2-0 silk stitch was placed in the descending colon intracorporeally and exteriorized via PMI in the standard fashion. Instruments and needles were removed in the standard fashion and the robot was undocked.  Upon doing a count of the needles we found that there was 1 missing needle.  We checked in the surgical field and we did not find it initially.  I asked for fluoroscopy and I performed fluoroscopy of the upper thigh pelvis , abdomen and lower chest.  There was no evidence of any retained foreign bodies.  I also placed my camera again and looked for the needle inside the abdominal cavity and did not find it.  After about 1 hour of trying to look for needle were able to find that the needle was stuck to a magnetic piece of one of the robotic instruments.  I scrubbed back in.All the laparoscopic ports were removed and a second look showed no evidence of any bleeding or any other injuries.  Liposomal marcaine was infiltrated at all incision sites in a full thickness fashion. The laparoscopic incisions were closed with 4-0 Monocryl in the standard fashion and a 12 mm fascia was closed with multiple interrupted 0 Vicryl's. Dermabond Was applied.  The ostomy site Was already marked in the left lower quadrant and using 15 blade knife circumferential incision was created all the way to the fascia in the standard fashion.  The fascia was divided and the rectus abdominal muscle was retracted I was able to place 2 fingers and I was able to find a stitch marking the descending colon.  The descending colon was able to be brought  outside the abdominal wall.  A loop colostomy was created in the standard fashion with multiple 3-0 Vicryls. Needle and laparotomy count were correct and there were no immediate complications.  Caroleen Hamman, MD, FACS

## 2021-05-01 NOTE — Anesthesia Preprocedure Evaluation (Addendum)
Anesthesia Evaluation  Patient identified by MRN, date of birth, ID band Patient awake    Reviewed: Allergy & Precautions, H&P , NPO status , reviewed documented beta blocker date and time   Airway Mallampati: II  TM Distance: >3 FB Neck ROM: full    Dental  (+) Poor Dentition, Chipped, Missing Very poor dentition w chipped/cracked teeth:   Pulmonary    Pulmonary exam normal        Cardiovascular Normal cardiovascular exam     Neuro/Psych    GI/Hepatic neg GERD  ,  Endo/Other    Renal/GU      Musculoskeletal   Abdominal   Peds  Hematology  (+) Blood dyscrasia, anemia ,   Anesthesia Other Findings Past Medical History: No date: Allergy No date: Anemia Past Surgical History: 04/24/2021: FLEXIBLE SIGMOIDOSCOPY; N/A     Comment:  Procedure: FLEXIBLE SIGMOIDOSCOPY;  Surgeon: [name], MD;  [facility];  Service:               Gastroenterology;  Laterality: N/A; BMI    Body Mass Index: 21.47 kg/m     Reproductive/Obstetrics                            Anesthesia Physical Anesthesia Plan  ASA: II  Anesthesia Plan: General ETT   Post-op Pain Management:    Induction: Intravenous  PONV Risk Score and Plan: 4 or greater and Ondansetron, Treatment may vary due to age or medical condition, Midazolam, Diphenhydramine and Dexamethasone  Airway Management Planned: Oral ETT  Additional Equipment:   Intra-op Plan:   Post-operative Plan: Extubation in OR  Informed Consent: I have reviewed the patients History and Physical, chart, labs and discussed the procedure including the risks, benefits and alternatives for the proposed anesthesia with the patient or authorized representative who has indicated his/her understanding and acceptance.     Dental Advisory Given  Plan Discussed with: CRNA  Anesthesia Plan Comments:        Anesthesia Quick  Evaluation

## 2021-05-01 NOTE — Consult Note (Addendum)
[facility] Nurse requested for preoperative stoma site marking  Discussed surgical procedure and stoma creation with patient. Explained role of the [place] nurse team.  Provided the patient with educational booklet and provided samples of pouching options.  Answered patient's questions.   Examined patient lying and sitting upright in the pre-op setting, in order to place the marking in the patient's visual field, away from any creases or abdominal contour issues and within the rectus muscle.  Attempted to mark below the patient's belt line. There are skin folds which occur above and below the locations when the patient leans forwarded which should be avoided if possible.  Marked for colostomy in the LLQ  __5__ cm to the left of the umbilicus and __2__BJ below the umbilicus.  Marked for ileostomy in the RLQ  __5__cm to the right of the umbilicus and  __6__ cm below the umbilicus.  Pt plans to have surgery this morning.  [facility] Nurse team will follow up with patient after surgery for continued ostomy care and teaching.  Julien Girt MSN, RN, [place], [place], [place]

## 2021-05-02 ENCOUNTER — Encounter: Payer: Self-pay | Admitting: Oncology

## 2021-05-02 ENCOUNTER — Encounter: Payer: Self-pay | Admitting: Surgery

## 2021-05-02 LAB — BASIC METABOLIC PANEL
Anion gap: 6 (ref 5–15)
BUN: 16 mg/dL (ref 8–23)
CO2: 26 mmol/L (ref 22–32)
Calcium: 8.7 mg/dL — ABNORMAL LOW (ref 8.9–10.3)
Chloride: 106 mmol/L (ref 98–111)
Creatinine, Ser: 0.99 mg/dL (ref 0.44–1.00)
GFR, Estimated: >60 mL/min (ref >60)
Glucose, Bld: 104 mg/dL — ABNORMAL HIGH (ref 70–99)
Potassium: 4.6 mmol/L (ref 3.5–5.1)
Sodium: 138 mmol/L (ref 135–145)

## 2021-05-02 LAB — CBC
HCT: 28.1 % — ABNORMAL LOW (ref 36.0–46.0)
Hemoglobin: 8.5 g/dL — ABNORMAL LOW (ref 12.0–15.0)
MCH: 22.9 pg — ABNORMAL LOW (ref 26.0–34.0)
MCHC: 30.2 g/dL (ref 30.0–36.0)
MCV: 75.7 fL — ABNORMAL LOW (ref 80.0–100.0)
Platelets: 292 10*3/uL (ref 150–400)
RBC: 3.71 MIL/uL — ABNORMAL LOW (ref 3.87–5.11)
RDW: 26.3 % — ABNORMAL HIGH (ref 11.5–15.5)
WBC: 13.7 10*3/uL — ABNORMAL HIGH (ref 4.0–10.5)
nRBC: 0 % (ref 0.0–0.2)

## 2021-05-02 LAB — PHOSPHORUS: Phosphorus: 4 mg/dL (ref 2.5–4.6)

## 2021-05-02 LAB — MAGNESIUM: Magnesium: 2.1 mg/dL (ref 1.7–2.4)

## 2021-05-02 MED ORDER — PREGABALIN 75 MG PO CAPS: 75.0000 mg | ORAL_CAPSULE | Freq: Three times a day (TID) | ORAL | 0 refills | Status: DC

## 2021-05-02 MED ORDER — KETOROLAC TROMETHAMINE 30 MG/ML IJ SOLN
15.0000 mg | Freq: Four times a day (QID) | INTRAMUSCULAR | Status: DC
  Administered 2021-05-02: 15 mg via INTRAVENOUS
  Filled 2021-05-02: qty 1

## 2021-05-02 MED ORDER — KETOROLAC TROMETHAMINE 30 MG/ML IJ SOLN: 30.0000 mg | Freq: Four times a day (QID) | INTRAMUSCULAR | Status: DC | PRN

## 2021-05-02 MED ORDER — OXYCODONE HCL 5 MG PO TABS: 5.0000 mg | ORAL_TABLET | ORAL | 0 refills | Status: DC | PRN

## 2021-05-02 MED ORDER — IBUPROFEN 600 MG PO TABS
600.0000 mg | ORAL_TABLET | Freq: Four times a day (QID) | ORAL | 0 refills | Status: DC | PRN
Start: 2021-05-02 — End: 2021-12-17

## 2021-05-02 NOTE — TOC Transition Note (Signed)
Transition of Care [facility]) - CM/SW Discharge Note   Patient Details  Name: Alicia Coleman MRN: [id number] Date of Birth: 1956/04/06  [facility]) CM/SW Contact:  Kerin Salen, RN Phone Number: 05/02/2021, 2:52 PM   Clinical Narrative:  Patient scheduled to discharge home today, states she will live with older sister until she gets strength. Sister Alicia Coleman is a retired Therapist, sports that will assist patient with Ostomy care. Home Health denied, due to $150 co-pay and sister Alicia Coleman feels confident in providing care needed for Ostomy care. Alicia Coleman was present during [employer]. Patient and sister advised to notify PCP if [facility] is later needed, both voices understanding. TOC barriers resolved.     Final next level of care: Home/Self Care Barriers to Discharge: Barriers Resolved   Patient Goals and CMS Choice Patient states their goals for this hospitalization and ongoing recovery are:: To return home.      Discharge Placement                Patient to be transferred to facility by: Alicia Coleman Name of family member notified: Alicia Coleman, sister/RN Patient and family notified of of transfer: 05/02/21  Discharge Plan and Services                DME Arranged: Ostomy supplies DME Agency: Other - Comment Alicia Girt, RN Ostomy Nurse) Date DME Agency Contacted: 05/02/21 Time DME Agency Contacted: 0900 Representative spoke with at DME Agency: Alicia Girt RN, Ostomy Nurse Educator HH Arranged: Refused HH          Social Determinants of Health ([place]) Interventions     Readmission Risk Interventions No flowsheet data found.

## 2021-05-02 NOTE — Progress Notes (Signed)
Alicia Coleman to be D/C'd Home per MD order.  Discussed prescriptions and follow up appointments with the patient. Prescriptions given to patient, medication list explained in detail. Pt verbalized understanding.  Allergies as of 05/02/2021   No Known Allergies     Medication List    TAKE these medications   dexamethasone 4 MG tablet Commonly known as: DECADRON Take 2 tablets (8 mg total) by mouth daily. Start the day after chemotherapy for 2 days. Take with food.   ibuprofen 600 MG tablet Commonly known as: ADVIL Take 1 tablet (600 mg total) by mouth every 6 (six) hours as needed.   lidocaine-prilocaine cream Commonly known as: EMLA Apply to affected area once   LORazepam 0.5 MG tablet Commonly known as: Ativan Take 1 tablet (0.5 mg total) by mouth every 6 (six) hours as needed (Nausea or vomiting).   ondansetron 8 MG tablet Commonly known as: Zofran Take 1 tablet (8 mg total) by mouth 2 (two) times daily as needed for refractory nausea / vomiting. Start on day 3 after chemotherapy.   oxyCODONE 5 MG immediate release tablet Commonly known as: Oxy IR/ROXICODONE Take 1 tablet (5 mg total) by mouth every 4 (four) hours as needed for severe pain or breakthrough pain.   pregabalin 75 MG capsule Commonly known as: LYRICA Take 1 capsule (75 mg total) by mouth 3 (three) times daily for 14 days.   prochlorperazine 10 MG tablet Commonly known as: COMPAZINE Take 1 tablet (10 mg total) by mouth every 6 (six) hours as needed (Nausea or vomiting).       Vitals:   05/02/21 0433 05/02/21 1033  BP: 95/67 99/68  Pulse: 64 62  Resp: 17 15  Temp: 98 F (36.7 C) 97.9 F (36.6 C)  SpO2: 100% 100%    Skin clean, dry and intact without evidence of skin break down, no evidence of skin tears noted. IV catheter discontinued intact. Site without signs and symptoms of complications. Dressing and pressure applied. Pt denies pain at this time. No complaints noted.  An After Visit Summary was  printed and given to the patient. Patient escorted via [place], and D/C home via private auto.  Fuller Mandril, RN

## 2021-05-02 NOTE — Discharge Instructions (Signed)
In addition to included general post-operative instructions,  Diet: Resume home heart diet.   Activity: No heavy lifting >20 pounds (children, pets, laundry, garbage) for 6 weeks, but light activity and walking are encouraged. Do not drive or drink alcohol if taking narcotic pain medications or having pain that might distract from driving.  Wound care: 2 days after surgery (06/04), you may shower/get incision wet with soapy water and pat dry (do not rub incisions), but no baths or submerging incision underwater until follow-up. Monitor colostomy output. You have a red rubber catheter present, this will remain until follow up.   Medications: Resume all home medications. For mild to moderate pain: acetaminophen (Tylenol) or ibuprofen/naproxen (if no kidney disease). Combining Tylenol with alcohol can substantially increase your risk of causing liver disease. Narcotic pain medications, if prescribed, can be used for severe pain, though may cause nausea, constipation, and drowsiness. Do not combine Tylenol and Percocet (or similar) within a 6 hour period as Percocet (and similar) contain(s) Tylenol. If you do not need the narcotic pain medication, you do not need to fill the prescription.  Call office [phone] / [phone]) at any time if any questions, worsening pain, fevers/chills, bleeding, drainage from incision site, or other concerns.

## 2021-05-02 NOTE — Plan of Care (Signed)
  Problem: Health Behavior/Discharge Planning: Goal: Ability to manage health-related needs will improve Outcome: Progressing   Problem: Clinical Measurements: Goal: Ability to maintain clinical measurements within normal limits will improve Outcome: Progressing Goal: Will remain free from infection Outcome: Progressing   Problem: Nutrition: Goal: Adequate nutrition will be maintained Outcome: Progressing   Problem: Pain Managment: Goal: General experience of comfort will improve Outcome: Progressing   Problem: Safety: Goal: Ability to remain free from injury will improve Outcome: Progressing   Problem: Education: Goal: Knowledge of ostomy care will improve Outcome: Progressing Goal: Understanding of discharge needs will improve Outcome: Progressing   Problem: Bowel/Gastric/Urinary: Goal: Gastrointestinal status for postoperative course will improve Outcome: Progressing   Problem: Health Behavior/Discharge Planning: Goal: Ability to manage health-related needs will improve Outcome: Progressing   Problem: Nutrition: Goal: Will attain and maintain optimal nutritional status will improve Outcome: Progressing   Problem: Clinical Measurements: Goal: Postoperative complications will be avoided or minimized Outcome: Progressing   Problem: Skin Integrity: Goal: Will show signs of wound healing Outcome: Progressing Goal: Risk for impaired skin integrity will decrease Outcome: Progressing

## 2021-05-02 NOTE — Progress Notes (Signed)
Pt seen and examined . I agree w Mr. [facility]. SHe is doing very well and she is  ready for DC

## 2021-05-02 NOTE — Consult Note (Addendum)
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 Surgical team has written for patient to discharge today after teaching session.  She requests a teaching session be performed when her sister visits today. She states her sister is an Therapist, sports and is familiar with pouching activities.  She states she will call her and arrange a time for teaching to occur today.  I told her I was only available until 3:00 today and I will check back with her at 10:00.  10:00: Returned to determine when sister will be present for ostomy teaching session.  She has not heard from her sister regarding a time yet.  I will return in 45 min.  10:45: Pt states her sister will arrive about 11:30.  I will return at that time for a teaching session.  11:30: Sister is not here.  I will return in a half hour.   12:00: Demonstrated pouch change to patient and sister at the bedside.  Sister states she is familiar with pouch application and emptying.  Stoma is red and viable, above skin level, 1 1/2 inches.  Rubber rod in place. Applied 2 piece pouching system and barrier ring.  Pt was able to open and close velcro to empty.  No stool, small amt flatus and pink drainage.  Reviewed pouching routines and ordering supplies.  Educational materials left at the bedside and 6 sets of ostomy wafers/pouches/barrier rings for use after discharge. Placed on SS.  Pt plans to discharge today, according to surgical team notes. Julien Girt MSN, RN, [place], [place], [place]

## 2021-05-02 NOTE — Patient Instructions (Signed)
Oxaliplatin Injection What is this medicine? OXALIPLATIN (ox AL i PLA tin) is a chemotherapy drug. It targets fast dividing cells, like cancer cells, and causes these cells to die. This medicine is used to treat cancers of the colon and rectum, and many other cancers. This medicine may be used for other purposes; ask your health care provider or pharmacist if you have questions. COMMON BRAND NAME(S): Eloxatin What should I tell my health care provider before I take this medicine? They need to know if you have any of these conditions:  heart disease  history of irregular heartbeat  liver disease  low blood counts, like white cells, platelets, or red blood cells  lung or breathing disease, like asthma  take medicines that treat or prevent blood clots  tingling of the fingers or toes, or other nerve disorder  an unusual or allergic reaction to oxaliplatin, other chemotherapy, other medicines, foods, dyes, or preservatives  pregnant or trying to get pregnant  breast-feeding How should I use this medicine? This drug is given as an infusion into a vein. It is administered in a hospital or clinic by a specially trained health care professional. Talk to your pediatrician regarding the use of this medicine in children. Special care may be needed. Overdosage: If you think you have taken too much of this medicine contact a poison control center or emergency room at once. NOTE: This medicine is only for you. Do not share this medicine with others. What if I miss a dose? It is important not to miss a dose. Call your doctor or health care professional if you are unable to keep an appointment. What may interact with this medicine? Do not take this medicine with any of the following medications:  cisapride  dronedarone  pimozide  thioridazine This medicine may also interact with the following medications:  aspirin and aspirin-like medicines  certain medicines that treat or prevent  blood clots like warfarin, apixaban, dabigatran, and rivaroxaban  cisplatin  cyclosporine  diuretics  medicines for infection like acyclovir, adefovir, amphotericin B, bacitracin, cidofovir, foscarnet, ganciclovir, gentamicin, pentamidine, vancomycin  NSAIDs, medicines for pain and inflammation, like ibuprofen or naproxen  other medicines that prolong the QT interval (an abnormal heart rhythm)  pamidronate  zoledronic acid This list may not describe all possible interactions. Give your health care provider a list of all the medicines, herbs, non-prescription drugs, or dietary supplements you use. Also tell them if you smoke, drink alcohol, or use illegal drugs. Some items may interact with your medicine. What should I watch for while using this medicine? Your condition will be monitored carefully while you are receiving this medicine. You may need blood work done while you are taking this medicine. This medicine may make you feel generally unwell. This is not uncommon as chemotherapy can affect healthy cells as well as cancer cells. Report any side effects. Continue your course of treatment even though you feel ill unless your healthcare professional tells you to stop. This medicine can make you more sensitive to cold. Do not drink cold drinks or use ice. Cover exposed skin before coming in contact with cold temperatures or cold objects. When out in cold weather wear warm clothing and cover your mouth and nose to warm the air that goes into your lungs. Tell your doctor if you get sensitive to the cold. Do not become pregnant while taking this medicine or for 9 months after stopping it. Women should inform their health care professional if they wish to become   pregnant or think they might be pregnant. Men should not father a child while taking this medicine and for 6 months after stopping it. There is potential for serious side effects to an unborn child. Talk to your health care professional  for more information. Do not breast-feed a child while taking this medicine or for 3 months after stopping it. This medicine has caused ovarian failure in some women. This medicine may make it more difficult to get pregnant. Talk to your health care professional if you are concerned about your fertility. This medicine has caused decreased sperm counts in some men. This may make it more difficult to father a child. Talk to your health care professional if you are concerned about your fertility. This medicine may increase your risk of getting an infection. Call your health care professional for advice if you get a fever, chills, or sore throat, or other symptoms of a cold or flu. Do not treat yourself. Try to avoid being around people who are sick. Avoid taking medicines that contain aspirin, acetaminophen, ibuprofen, naproxen, or ketoprofen unless instructed by your health care professional. These medicines may hide a fever. Be careful brushing or flossing your teeth or using a toothpick because you may get an infection or bleed more easily. If you have any dental work done, tell your dentist you are receiving this medicine. What side effects may I notice from receiving this medicine? Side effects that you should report to your doctor or health care professional as soon as possible:  allergic reactions like skin rash, itching or hives, swelling of the face, lips, or tongue  breathing problems  cough  low blood counts - this medicine may decrease the number of white blood cells, red blood cells, and platelets. You may be at increased risk for infections and bleeding  nausea, vomiting  pain, redness, or irritation at site where injected  pain, tingling, numbness in the hands or feet  signs and symptoms of bleeding such as bloody or black, tarry stools; red or dark brown urine; spitting up blood or brown material that looks like coffee grounds; red spots on the skin; unusual bruising or bleeding  from the eyes, gums, or nose  signs and symptoms of a dangerous change in heartbeat or heart rhythm like chest pain; dizziness; fast, irregular heartbeat; palpitations; feeling faint or lightheaded; falls  signs and symptoms of infection like fever; chills; cough; sore throat; pain or trouble passing urine  signs and symptoms of liver injury like dark yellow or brown urine; general ill feeling or flu-like symptoms; light-colored stools; loss of appetite; nausea; right upper belly pain; unusually weak or tired; yellowing of the eyes or skin  signs and symptoms of low red blood cells or anemia such as unusually weak or tired; feeling faint or lightheaded; falls  signs and symptoms of muscle injury like dark urine; trouble passing urine or change in the amount of urine; unusually weak or tired; muscle pain; back pain Side effects that usually do not require medical attention (report to your doctor or health care professional if they continue or are bothersome):  changes in taste  diarrhea  gas  hair loss  loss of appetite  mouth sores This list may not describe all possible side effects. Call your doctor for medical advice about side effects. You may report side effects to FDA at 1-800-FDA-1088. Where should I keep my medicine? This drug is given in a hospital or clinic and will not be stored at home. NOTE:   This sheet is a summary. It may not cover all possible information. If you have questions about this medicine, talk to your doctor, pharmacist, or health care provider.  2021 Elsevier/Gold Standard (2019-04-05 12:20:35) Fluorouracil, 5-FU injection What is this medicine? FLUOROURACIL, 5-FU (flure oh YOOR a sil) is a chemotherapy drug. It slows the growth of cancer cells. This medicine is used to treat many types of cancer like breast cancer, colon or rectal cancer, pancreatic cancer, and stomach cancer. This medicine may be used for other purposes; ask your health care provider or  pharmacist if you have questions. COMMON BRAND NAME(S): Adrucil What should I tell my health care provider before I take this medicine? They need to know if you have any of these conditions:  blood disorders  dihydropyrimidine dehydrogenase (DPD) deficiency  infection (especially a virus infection such as chickenpox, cold sores, or herpes)  kidney disease  liver disease  malnourished, poor nutrition  recent or ongoing radiation therapy  an unusual or allergic reaction to fluorouracil, other chemotherapy, other medicines, foods, dyes, or preservatives  pregnant or trying to get pregnant  breast-feeding How should I use this medicine? This drug is given as an infusion or injection into a vein. It is administered in a hospital or clinic by a specially trained health care professional. Talk to your pediatrician regarding the use of this medicine in children. Special care may be needed. Overdosage: If you think you have taken too much of this medicine contact a poison control center or emergency room at once. NOTE: This medicine is only for you. Do not share this medicine with others. What if I miss a dose? It is important not to miss your dose. Call your doctor or health care professional if you are unable to keep an appointment. What may interact with this medicine? Do not take this medicine with any of the following medications:  live virus vaccines This medicine may also interact with the following medications:  medicines that treat or prevent blood clots like warfarin, enoxaparin, and dalteparin This list may not describe all possible interactions. Give your health care provider a list of all the medicines, herbs, non-prescription drugs, or dietary supplements you use. Also tell them if you smoke, drink alcohol, or use illegal drugs. Some items may interact with your medicine. What should I watch for while using this medicine? Visit your doctor for checks on your progress.  This drug may make you feel generally unwell. This is not uncommon, as chemotherapy can affect healthy cells as well as cancer cells. Report any side effects. Continue your course of treatment even though you feel ill unless your doctor tells you to stop. In some cases, you may be given additional medicines to help with side effects. Follow all directions for their use. Call your doctor or health care professional for advice if you get a fever, chills or sore throat, or other symptoms of a cold or flu. Do not treat yourself. This drug decreases your body's ability to fight infections. Try to avoid being around people who are sick. This medicine may increase your risk to bruise or bleed. Call your doctor or health care professional if you notice any unusual bleeding. Be careful brushing and flossing your teeth or using a toothpick because you may get an infection or bleed more easily. If you have any dental work done, tell your dentist you are receiving this medicine. Avoid taking products that contain aspirin, acetaminophen, ibuprofen, naproxen, or ketoprofen unless instructed by your doctor.  These medicines may hide a fever. Do not become pregnant while taking this medicine. Women should inform their doctor if they wish to become pregnant or think they might be pregnant. There is a potential for serious side effects to an unborn child. Talk to your health care professional or pharmacist for more information. Do not breast-feed an infant while taking this medicine. Men should inform their doctor if they wish to father a child. This medicine may lower sperm counts. Do not treat diarrhea with over the counter products. Contact your doctor if you have diarrhea that lasts more than 2 days or if it is severe and watery. This medicine can make you more sensitive to the sun. Keep out of the sun. If you cannot avoid being in the sun, wear protective clothing and use sunscreen. Do not use sun lamps or tanning  beds/booths. What side effects may I notice from receiving this medicine? Side effects that you should report to your doctor or health care professional as soon as possible:  allergic reactions like skin rash, itching or hives, swelling of the face, lips, or tongue  low blood counts - this medicine may decrease the number of white blood cells, red blood cells and platelets. You may be at increased risk for infections and bleeding.  signs of infection - fever or chills, cough, sore throat, pain or difficulty passing urine  signs of decreased platelets or bleeding - bruising, pinpoint red spots on the skin, black, tarry stools, blood in the urine  signs of decreased red blood cells - unusually weak or tired, fainting spells, lightheadedness  breathing problems  changes in vision  chest pain  mouth sores  nausea and vomiting  pain, swelling, redness at site where injected  pain, tingling, numbness in the hands or feet  redness, swelling, or sores on hands or feet  stomach pain  unusual bleeding Side effects that usually do not require medical attention (report to your doctor or health care professional if they continue or are bothersome):  changes in finger or toe nails  diarrhea  dry or itchy skin  hair loss  headache  loss of appetite  sensitivity of eyes to the light  stomach upset  unusually teary eyes This list may not describe all possible side effects. Call your doctor for medical advice about side effects. You may report side effects to FDA at 1-800-FDA-1088. Where should I keep my medicine? This drug is given in a hospital or clinic and will not be stored at home. NOTE: This sheet is a summary. It may not cover all possible information. If you have questions about this medicine, talk to your doctor, pharmacist, or health care provider.  2021 Elsevier/Gold Standard (2019-10-17 15:00:03) Leucovorin injection What is this medicine? LEUCOVORIN (loo koe  VOR in) is used to prevent or treat the harmful effects of some medicines. This medicine is used to treat anemia caused by a low amount of folic acid in the body. It is also used with 5-fluorouracil (5-FU) to treat colon cancer. This medicine may be used for other purposes; ask your health care provider or pharmacist if you have questions. What should I tell my health care provider before I take this medicine? They need to know if you have any of these conditions:  anemia from low levels of vitamin B-12 in the blood  an unusual or allergic reaction to leucovorin, folic acid, other medicines, foods, dyes, or preservatives  pregnant or trying to get pregnant  breast-feeding How  should I use this medicine? This medicine is for injection into a muscle or into a vein. It is given by a health care professional in a hospital or clinic setting. Talk to your pediatrician regarding the use of this medicine in children. Special care may be needed. Overdosage: If you think you have taken too much of this medicine contact a poison control center or emergency room at once. NOTE: This medicine is only for you. Do not share this medicine with others. What if I miss a dose? This does not apply. What may interact with this medicine?  capecitabine  fluorouracil  phenobarbital  phenytoin  primidone  trimethoprim-sulfamethoxazole This list may not describe all possible interactions. Give your health care provider a list of all the medicines, herbs, non-prescription drugs, or dietary supplements you use. Also tell them if you smoke, drink alcohol, or use illegal drugs. Some items may interact with your medicine. What should I watch for while using this medicine? Your condition will be monitored carefully while you are receiving this medicine. This medicine may increase the side effects of 5-fluorouracil, 5-FU. Tell your doctor or health care professional if you have diarrhea or mouth sores that do not  get better or that get worse. What side effects may I notice from receiving this medicine? Side effects that you should report to your doctor or health care professional as soon as possible:  allergic reactions like skin rash, itching or hives, swelling of the face, lips, or tongue  breathing problems  fever, infection  mouth sores  unusual bleeding or bruising  unusually weak or tired Side effects that usually do not require medical attention (report to your doctor or health care professional if they continue or are bothersome):  constipation or diarrhea  loss of appetite  nausea, vomiting This list may not describe all possible side effects. Call your doctor for medical advice about side effects. You may report side effects to FDA at 1-800-FDA-1088. Where should I keep my medicine? This drug is given in a hospital or clinic and will not be stored at home. NOTE: This sheet is a summary. It may not cover all possible information. If you have questions about this medicine, talk to your doctor, pharmacist, or health care provider.  2021 Elsevier/Gold Standard (2008-05-22 16:50:29)

## 2021-05-02 NOTE — Progress Notes (Signed)
Tumor Board Documentation  Alicia Coleman was presented by Verlon Au, RN at our Tumor Board on 05/01/2021, which included representatives from medical oncology,radiation oncology,navigation,pathology,radiology,surgical,pharmacy,genetics,research,palliative care.  Alicia Coleman currently presents as a current patient,for MDC,for new positive pathology with history of the following treatments: surgical intervention(s),active survellience.  Additionally, we reviewed previous medical and familial history, history of present illness, and recent lab results along with all available histopathologic and imaging studies. The tumor board considered available treatment options and made the following recommendations: Additional screening,Chemotherapy (NGS testing) Possible Liver resection after chemotherapy  The following procedures/referrals were also placed: No orders of the defined types were placed in this encounter.   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  Tumor board is a meeting of clinicians from various specialty areas who evaluate and discuss patients for whom a multidisciplinary approach is being considered. Final determinations in the plan of care are those of the provider(s). The responsibility for follow up of recommendations given during tumor board is that of the provider.   Today's extended care, comprehensive team conference, Alicia Coleman was not present for the discussion and was not examined.   Multidisciplinary Tumor Board is a multidisciplinary case peer review process.  Decisions discussed in the Multidisciplinary Tumor Board reflect the opinions of the specialists present at the conference without having examined the patient.  Ultimately, treatment and diagnostic decisions rest with the primary  provider(s) and the patient.

## 2021-05-02 NOTE — Discharge Summary (Addendum)
[facility] SURGICAL DISCHARGE SUMMARY  Patient ID: Alicia Coleman MRN: [id number] DOB/AGE: Apr 19, 1956 65 y.o.  Admit date: 05/01/2021 Discharge date: 05/02/2021  Discharge Diagnoses Patient Active Problem List   Diagnosis Date Noted  . Metastatic colon cancer in female [facility]) 04/25/2021    Consultants Wound Ostomy  Procedures 05/01/2021 1. Placement of right IJ port under ultrasound and fluoroscopic guidance  2. Robotic assisted Laparoscopic loop colostomy 3. Robotic Laparoscopic takedown of splenic flexure  HPI: [name] is a 65 y.o. female with a history of metastatic adenocarcinoma of the distal colon who presents to [facility] on 06/02 for scheduled insertion of port-a-catheter and robotic assisted loop colostomy with Dr Dahlia Byes.   Hospital Course: Informed consent was obtained and documented, and patient underwent uneventful insertion of port-a-catheter and robotic assisted loop colostomy (Dr Dahlia Byes, 05/01/2021). Post-operatively, patient did well and had bowel function on POD1. She had ostomy teaching prior to discharge home. Advancement of patient's diet and ambulation were well-tolerated. The remainder of patient's hospital course was essentially unremarkable, and discharge planning was initiated accordingly with patient safely able to be discharged home with appropriate discharge instructions, pain control, and outpatient follow-up after all of her questions were answered to her expressed satisfaction.   Discharge Condition: Good   Physical Examination:  Constitutional: Well appearing female, NAD Pulmonary: Normal effort, no respiratory distress Gastrointestinal: Soft, non-tender, non-distended, no rebound/guarding. Loop colostomy in the left mid-abdomen, pink, patent, there is gas and a small amount of bowel sweat in bag.  Skin: Laparoscopic incisions are CDI with dermabond, no erythema or drainage    Allergies as of 05/02/2021   No Known Allergies      Medication List    TAKE these medications   dexamethasone 4 MG tablet Commonly known as: DECADRON Take 2 tablets (8 mg total) by mouth daily. Start the day after chemotherapy for 2 days. Take with food.   ibuprofen 600 MG tablet Commonly known as: ADVIL Take 1 tablet (600 mg total) by mouth every 6 (six) hours as needed.   lidocaine-prilocaine cream Commonly known as: EMLA Apply to affected area once   LORazepam 0.5 MG tablet Commonly known as: Ativan Take 1 tablet (0.5 mg total) by mouth every 6 (six) hours as needed (Nausea or vomiting).   ondansetron 8 MG tablet Commonly known as: Zofran Take 1 tablet (8 mg total) by mouth 2 (two) times daily as needed for refractory nausea / vomiting. Start on day 3 after chemotherapy.   oxyCODONE 5 MG immediate release tablet Commonly known as: Oxy IR/ROXICODONE Take 1 tablet (5 mg total) by mouth every 4 (four) hours as needed for severe pain or breakthrough pain.   pregabalin 75 MG capsule Commonly known as: LYRICA Take 1 capsule (75 mg total) by mouth 3 (three) times daily for 14 days.   prochlorperazine 10 MG tablet Commonly known as: COMPAZINE Take 1 tablet (10 mg total) by mouth every 6 (six) hours as needed (Nausea or vomiting).         Follow-up Information    Pabon, [place] F, MD. Schedule an appointment as soon as possible for a visit in 10 day(s).   Specialty: General Surgery Why: s/p loop colostomy and port-a-catheter insertion  Contact information: [address] [place] [address] [phone]                Time spent on discharge management including discussion of hospital course, clinical condition, outpatient instructions, prescriptions, and follow up with the patient and  members of the medical team: >30 minutes  -- Edison Simon , PA-C [facility]  05/02/2021, 9:25 AM [phone] M-F: 7am - 4pm

## 2021-05-02 NOTE — Progress Notes (Signed)
Drinking well, ambulated X 2 to BR overnight; unable to void and without urge to void; Bladder scanned X 2: 217 ml and now 440 ml; Dr. Dahlia Byes notified; IVF @ 50 ml/hr; colostomy passing flatus; denies nausea or fullness; Order to do I/O X one. Will continue to monitor output; Jannette Fogo, RN day care nurse also notified. Barbaraann Faster, RN 7:31 AM 05/02/2021

## 2021-05-05 ENCOUNTER — Inpatient Hospital Stay: Payer: BC Managed Care – PPO | Attending: Oncology

## 2021-05-05 ENCOUNTER — Ambulatory Visit: Payer: Self-pay | Admitting: Surgery

## 2021-05-05 ENCOUNTER — Other Ambulatory Visit: Payer: Self-pay

## 2021-05-05 DIAGNOSIS — C187 Malignant neoplasm of sigmoid colon: Secondary | ICD-10-CM | POA: Insufficient documentation

## 2021-05-05 DIAGNOSIS — Z452 Encounter for adjustment and management of vascular access device: Secondary | ICD-10-CM | POA: Insufficient documentation

## 2021-05-05 DIAGNOSIS — E538 Deficiency of other specified B group vitamins: Secondary | ICD-10-CM | POA: Insufficient documentation

## 2021-05-05 DIAGNOSIS — C20 Malignant neoplasm of rectum: Secondary | ICD-10-CM | POA: Insufficient documentation

## 2021-05-05 DIAGNOSIS — C786 Secondary malignant neoplasm of retroperitoneum and peritoneum: Secondary | ICD-10-CM | POA: Insufficient documentation

## 2021-05-05 DIAGNOSIS — Z5111 Encounter for antineoplastic chemotherapy: Secondary | ICD-10-CM | POA: Insufficient documentation

## 2021-05-05 DIAGNOSIS — C787 Secondary malignant neoplasm of liver and intrahepatic bile duct: Secondary | ICD-10-CM | POA: Insufficient documentation

## 2021-05-12 ENCOUNTER — Ambulatory Visit (INDEPENDENT_AMBULATORY_CARE_PROVIDER_SITE_OTHER): Payer: BC Managed Care – PPO | Admitting: Surgery

## 2021-05-12 ENCOUNTER — Encounter: Payer: Self-pay | Admitting: Surgery

## 2021-05-12 ENCOUNTER — Ambulatory Visit
Admission: RE | Admit: 2021-05-12 | Discharge: 2021-05-12 | Disposition: A | Discharge status: Home / Self Care | Payer: BC Managed Care – PPO | Source: Ambulatory Visit | Attending: Oncology | Admitting: Oncology

## 2021-05-12 ENCOUNTER — Other Ambulatory Visit: Payer: Self-pay

## 2021-05-12 VITALS — BP 101/70 | HR 91 | Temp 99.3°F | Ht 66.0 in | Wt 144.4 lb

## 2021-05-12 DIAGNOSIS — Z09 Encounter for follow-up examination after completed treatment for conditions other than malignant neoplasm: Secondary | ICD-10-CM

## 2021-05-12 DIAGNOSIS — C189 Malignant neoplasm of colon, unspecified: Secondary | ICD-10-CM | POA: Diagnosis present

## 2021-05-12 NOTE — Patient Instructions (Signed)
Follow up here in 2 months for reassessment.

## 2021-05-13 NOTE — Progress Notes (Signed)
Pharmacist Chemotherapy Monitoring - Initial Assessment    Anticipated start date: 62022    Regimen:  Are orders appropriate based on the patient's diagnosis, regimen, and cycle? Yes Does the plan date match the patient's scheduled date? Yes Is the sequencing of drugs appropriate? Yes Are the premedications appropriate for the patient's regimen? Yes Prior Authorization for treatment is: Approved If applicable, is the correct biosimilar selected based on the patient's insurance? yes  Organ Function and Labs: Are dose adjustments needed based on the patient's renal function, hepatic function, or hematologic function? No Are appropriate labs ordered prior to the start of patient's treatment? Yes Other organ system assessment, if indicated: N/A The following baseline labs, if indicated, have been ordered: N/A  Dose Assessment: Are the drug doses appropriate? Yes Are the following correct: Drug concentrations Yes IV fluid compatible with drug Yes Administration routes Yes Timing of therapy Yes If applicable, does the patient have documented access for treatment and/or plans for port-a-cath placement? yes If applicable, have lifetime cumulative doses been properly documented and assessed? yes Lifetime Dose Tracking  No doses have been documented on this patient for the following tracked chemicals: Doxorubicin, Epirubicin, Idarubicin, Daunorubicin, Mitoxantrone, Bleomycin, Oxaliplatin, Carboplatin, Liposomal Doxorubicin    Toxicity Monitoring/Prevention: The patient has the following take home antiemetics prescribed: Ondansetron The patient has the following take home medications prescribed: N/A Medication allergies and previous infusion related reactions, if applicable, have been reviewed and addressed. Yes The patient's current medication list has been assessed for drug-drug interactions with their chemotherapy regimen. no significant drug-drug interactions were identified on  review.  Order Review: Are the treatment plan orders signed? No Is the patient scheduled to see a provider prior to their treatment? Yes  I verify that I have reviewed each item in the above checklist and answered each question accordingly.  Adelina Mings, RPH, 05/13/2021  1:58 PM

## 2021-05-14 ENCOUNTER — Telehealth: Payer: Self-pay | Admitting: Surgery

## 2021-05-14 NOTE — Progress Notes (Signed)
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 Ostomy patent.  Pink and viable.Device changed and education performed personally . Red Rubber removed.   A/P Doing well RTC 3 months Ok to start chemo from my perspective

## 2021-05-14 NOTE — Telephone Encounter (Signed)
Pt called concerned that she will not have enough ostomy bag because she is quickly going through supplies & will not have enough to last until her order is due to arrive in a week.  Plz advise by calling back.  Thank you

## 2021-05-15 ENCOUNTER — Encounter: Payer: Self-pay | Admitting: Oncology

## 2021-05-15 NOTE — Telephone Encounter (Signed)
Spoke with the patient and let her know that we have some ostomy supplies she may pick up at the front desk.

## 2021-05-18 ENCOUNTER — Other Ambulatory Visit: Payer: Self-pay | Admitting: *Deleted

## 2021-05-18 DIAGNOSIS — C189 Malignant neoplasm of colon, unspecified: Secondary | ICD-10-CM

## 2021-05-19 ENCOUNTER — Other Ambulatory Visit: Payer: Self-pay | Admitting: *Deleted

## 2021-05-19 ENCOUNTER — Inpatient Hospital Stay: Payer: BC Managed Care – PPO

## 2021-05-19 ENCOUNTER — Inpatient Hospital Stay (HOSPITAL_BASED_OUTPATIENT_CLINIC_OR_DEPARTMENT_OTHER): Payer: BC Managed Care – PPO | Admitting: Oncology

## 2021-05-19 ENCOUNTER — Other Ambulatory Visit: Payer: Self-pay

## 2021-05-19 ENCOUNTER — Encounter: Payer: Self-pay | Admitting: Oncology

## 2021-05-19 VITALS — BP 116/70 | HR 87 | Temp 97.0°F | Wt 144.0 lb

## 2021-05-19 DIAGNOSIS — E538 Deficiency of other specified B group vitamins: Secondary | ICD-10-CM | POA: Diagnosis not present

## 2021-05-19 DIAGNOSIS — C787 Secondary malignant neoplasm of liver and intrahepatic bile duct: Secondary | ICD-10-CM | POA: Diagnosis not present

## 2021-05-19 DIAGNOSIS — C189 Malignant neoplasm of colon, unspecified: Secondary | ICD-10-CM

## 2021-05-19 DIAGNOSIS — D509 Iron deficiency anemia, unspecified: Secondary | ICD-10-CM | POA: Diagnosis not present

## 2021-05-19 DIAGNOSIS — Z452 Encounter for adjustment and management of vascular access device: Secondary | ICD-10-CM | POA: Diagnosis not present

## 2021-05-19 DIAGNOSIS — Z5111 Encounter for antineoplastic chemotherapy: Secondary | ICD-10-CM

## 2021-05-19 DIAGNOSIS — C187 Malignant neoplasm of sigmoid colon: Secondary | ICD-10-CM | POA: Diagnosis present

## 2021-05-19 DIAGNOSIS — C20 Malignant neoplasm of rectum: Secondary | ICD-10-CM | POA: Diagnosis present

## 2021-05-19 DIAGNOSIS — C786 Secondary malignant neoplasm of retroperitoneum and peritoneum: Secondary | ICD-10-CM | POA: Diagnosis not present

## 2021-05-19 LAB — COMPREHENSIVE METABOLIC PANEL
ALT: 8 U/L (ref 0–44)
ALT: 9 U/L (ref 0–44)
AST: 16 U/L (ref 15–41)
AST: 15 U/L (ref 15–41)
Albumin: 2.6 g/dL — ABNORMAL LOW (ref 3.5–5.0)
Albumin: 2.5 g/dL — ABNORMAL LOW (ref 3.5–5.0)
Alkaline Phosphatase: 88 U/L (ref 38–126)
Alkaline Phosphatase: 83 U/L (ref 38–126)
Anion gap: 10 (ref 5–15)
Anion gap: 8 (ref 5–15)
BUN: 18 mg/dL (ref 8–23)
BUN: 18 mg/dL (ref 8–23)
CO2: 24 mmol/L (ref 22–32)
CO2: 25 mmol/L (ref 22–32)
Calcium: 8.5 mg/dL — ABNORMAL LOW (ref 8.9–10.3)
Calcium: 8.3 mg/dL — ABNORMAL LOW (ref 8.9–10.3)
Chloride: 104 mmol/L (ref 98–111)
Chloride: 103 mmol/L (ref 98–111)
Creatinine, Ser: 0.84 mg/dL (ref 0.44–1.00)
Creatinine, Ser: 0.68 mg/dL (ref 0.44–1.00)
GFR, Estimated: >60 mL/min (ref >60)
GFR, Estimated: >60 mL/min (ref >60)
Glucose, Bld: 110 mg/dL — ABNORMAL HIGH (ref 70–99)
Glucose, Bld: 94 mg/dL (ref 70–99)
Potassium: 3.7 mmol/L (ref 3.5–5.1)
Potassium: 3.8 mmol/L (ref 3.5–5.1)
Sodium: 138 mmol/L (ref 135–145)
Sodium: 136 mmol/L (ref 135–145)
Total Bilirubin: 0.2 mg/dL — ABNORMAL LOW (ref 0.3–1.2)
Total Bilirubin: 0.5 mg/dL (ref 0.3–1.2)
Total Protein: 7.5 g/dL (ref 6.5–8.1)
Total Protein: 7.3 g/dL (ref 6.5–8.1)

## 2021-05-19 LAB — HEPATITIS B CORE ANTIBODY, TOTAL: Hep B Core Total Ab: NONREACTIVE

## 2021-05-19 LAB — FOLATE: Folate: 5.4 ng/mL — ABNORMAL LOW (ref >5.9)

## 2021-05-19 LAB — CBC WITH DIFFERENTIAL/PLATELET
Abs Immature Granulocytes: 0.05 10*3/uL (ref 0.00–0.07)
Basophils Absolute: 0 10*3/uL (ref 0.0–0.1)
Basophils Relative: 0 %
Eosinophils Absolute: 0.4 10*3/uL (ref 0.0–0.5)
Eosinophils Relative: 5 %
HCT: 27 % — ABNORMAL LOW (ref 36.0–46.0)
Hemoglobin: 8.2 g/dL — ABNORMAL LOW (ref 12.0–15.0)
Immature Granulocytes: 1 %
Lymphocytes Relative: 11 %
Lymphs Abs: 1 10*3/uL (ref 0.7–4.0)
MCH: 23.4 pg — ABNORMAL LOW (ref 26.0–34.0)
MCHC: 30.4 g/dL (ref 30.0–36.0)
MCV: 76.9 fL — ABNORMAL LOW (ref 80.0–100.0)
Monocytes Absolute: 0.5 10*3/uL (ref 0.1–1.0)
Monocytes Relative: 5 %
Neutro Abs: 7 10*3/uL (ref 1.7–7.7)
Neutrophils Relative %: 78 %
Platelets: 401 10*3/uL — ABNORMAL HIGH (ref 150–400)
RBC: 3.51 MIL/uL — ABNORMAL LOW (ref 3.87–5.11)
Smear Review: ADEQUATE
WBC: 9 10*3/uL (ref 4.0–10.5)
nRBC: 0 % (ref 0.0–0.2)

## 2021-05-19 LAB — IRON AND TIBC
Iron: 23 ug/dL — ABNORMAL LOW (ref 28–170)
Saturation Ratios: 10 % — ABNORMAL LOW (ref 10.4–31.8)
TIBC: 237 ug/dL — ABNORMAL LOW (ref 250–450)
UIBC: 214 ug/dL

## 2021-05-19 LAB — FERRITIN: Ferritin: 232 ng/mL (ref 11–307)

## 2021-05-19 MED ORDER — SODIUM CHLORIDE 0.9 % IV SOLN
2400.0000 mg/m2 | INTRAVENOUS | Status: DC
  Administered 2021-05-19: 4100 mg via INTRAVENOUS
  Filled 2021-05-19: qty 82

## 2021-05-19 MED ORDER — LEUCOVORIN CALCIUM INJECTION 350 MG
409.0000 mg/m2 | Freq: Once | INTRAVENOUS | Status: AC
  Administered 2021-05-19: 700 mg via INTRAVENOUS
  Filled 2021-05-19: qty 25

## 2021-05-19 MED ORDER — OXALIPLATIN CHEMO INJECTION 100 MG/20ML
87.0000 mg/m2 | Freq: Once | INTRAVENOUS | Status: AC
  Administered 2021-05-19: 150 mg via INTRAVENOUS
  Filled 2021-05-19: qty 20

## 2021-05-19 MED ORDER — FOLIC ACID 1 MG PO TABS: 1.0000 mg | ORAL_TABLET | Freq: Every day | ORAL | 3 refills | Status: DC

## 2021-05-19 MED ORDER — PALONOSETRON HCL INJECTION 0.25 MG/5ML
0.2500 mg | Freq: Once | INTRAVENOUS | Status: AC
  Administered 2021-05-19: 0.25 mg via INTRAVENOUS
  Filled 2021-05-19: qty 5

## 2021-05-19 MED ORDER — DEXAMETHASONE SODIUM PHOSPHATE 100 MG/10ML IJ SOLN
10.0000 mg | Freq: Once | INTRAMUSCULAR | Status: AC
Start: 2021-05-19 — End: 2021-05-19
  Administered 2021-05-19: 10 mg via INTRAVENOUS
  Filled 2021-05-19: qty 10

## 2021-05-19 MED ORDER — DEXTROSE 5 % IV SOLN
Freq: Once | INTRAVENOUS | Status: AC
Start: 2021-05-19 — End: 2021-05-19
  Filled 2021-05-19: qty 250

## 2021-05-19 MED ORDER — FLUOROURACIL CHEMO INJECTION 2.5 GM/50ML
400.0000 mg/m2 | Freq: Once | INTRAVENOUS | Status: AC
  Administered 2021-05-19: 700 mg via INTRAVENOUS
  Filled 2021-05-19: qty 14

## 2021-05-19 NOTE — Progress Notes (Signed)
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  She received IV Venofer for that.  I was concerned about malignancy given the degree of anemia and therefore obtain CT abdomen and pelvis with contrast CT showed 6.4 cm enhancing mass in the right liver and another mass measuring 4 cm.  Large volume stool in the right colon transverse and descending colon with irregular mass lesion in the distal sigmoid colon extending into the rectosigmoid junction with lateral extracolonic extension into the pelvic sidewall.  Apparent luminal narrowing at the level of the lesion which accounts for large volume of stool proximally.  Multiple pericolonic and perirectal lymph nodes.  Multiple peritoneal nodules are seen in the lower right pelvic sidewall.  Colon mass appears to be invading posterior uterus.Thrombosis of the superior rectal vein may be bland thrombus although direct tumor invasion not excluded.   Patient had a flexible sigmoidoscopy which showed a large obstructing mass in the rectosigmoid colon.  Scope could not be traversed beyond the obstruction.  Biopsies were taken and were consistent with adenocarcinoma  Interval history-reports doing better after surgery.  Abdominal pain is better.  She is having regular bowel movements through her colostomy.  She has gained 11 pounds in the last 3 weeks  ECOG PS- 1 Pain scale- 2 Opioid associated  constipation- no  Review of systems- Review of Systems  Constitutional:  Positive for malaise/fatigue. Negative for chills, fever and weight loss.  HENT:  Negative for congestion, ear discharge and nosebleeds.   Eyes:  Negative for blurred vision.  Respiratory:  Negative for cough, hemoptysis, sputum production, shortness of breath and wheezing.   Cardiovascular:  Negative for chest pain, palpitations, orthopnea and claudication.  Gastrointestinal:  Negative for abdominal pain, blood in stool, constipation, diarrhea, heartburn, melena, nausea and vomiting.  Genitourinary:  Negative for dysuria, flank pain, frequency, hematuria and urgency.  Musculoskeletal:  Negative for back pain, joint pain and myalgias.  Skin:  Negative for rash.  Neurological:  Negative for dizziness, tingling, focal weakness, seizures, weakness and headaches.  Endo/Heme/Allergies:  Does not bruise/bleed easily.  Psychiatric/Behavioral:  Negative for depression and suicidal ideas. The patient does not have insomnia.       No Known Allergies   Past Medical History:  Diagnosis Date   Allergy    Anemia      Past Surgical History:  Procedure Laterality Date   FLEXIBLE SIGMOIDOSCOPY N/A 04/24/2021   Procedure: FLEXIBLE SIGMOIDOSCOPY;  Surgeon: [name], MD;  Location: [facility] ENDOSCOPY;  Service: Gastroenterology;  Laterality: N/A;   PORTACATH PLACEMENT N/A 05/01/2021   Procedure: INSERTION PORT-A-CATH;  Surgeon: Jules Husbands, MD;  Location: [facility] ORS;  Service: General;  Laterality: N/A;    Social History   Socioeconomic History   Marital status: Married    Spouse name: Not on file   Number of children: Not on file   Years of education: Not on file   Highest education level: Not on file  Occupational History   Not on  file  Tobacco Use   Smoking status: Never   Smokeless tobacco: Never  Vaping Use   Vaping Use: Never used  Substance and Sexual Activity   Alcohol use: Not Currently   Drug use: Never    Sexual activity: Not Currently  Other Topics Concern   Not on file  Social History Narrative   Not on file   Social Determinants of Health   Financial Resource Strain: Not on file  Food Insecurity: Not on file  Transportation Needs: Not on file  Physical Activity: Not on file  Stress: Not on file  Social Connections: Not on file  Intimate Partner Violence: Not on file    Family History  Problem Relation Age of Onset   Stroke Mother    Hypertension Mother    Cancer Father      Current Outpatient Medications:    folic acid (FOLVITE) 1 MG tablet, Take 1 tablet (1 mg total) by mouth daily., Disp: 30 tablet, Rfl: 3   lidocaine-prilocaine (EMLA) cream, Apply to affected area once, Disp: 30 g, Rfl: 3   pregabalin (LYRICA) 75 MG capsule, Take 1 capsule (75 mg total) by mouth 3 (three) times daily for 14 days., Disp: 42 capsule, Rfl: 0   dexamethasone (DECADRON) 4 MG tablet, Take 2 tablets (8 mg total) by mouth daily. Start the day after chemotherapy for 2 days. Take with food. (Patient not taking: Reported on 05/19/2021), Disp: 30 tablet, Rfl: 1   ibuprofen (ADVIL) 600 MG tablet, Take 1 tablet (600 mg total) by mouth every 6 (six) hours as needed., Disp: 30 tablet, Rfl: 0   LORazepam (ATIVAN) 0.5 MG tablet, Take 1 tablet (0.5 mg total) by mouth every 6 (six) hours as needed (Nausea or vomiting). (Patient not taking: Reported on 05/19/2021), Disp: 30 tablet, Rfl: 0   ondansetron (ZOFRAN) 8 MG tablet, Take 1 tablet (8 mg total) by mouth 2 (two) times daily as needed for refractory nausea / vomiting. Start on day 3 after chemotherapy. (Patient not taking: Reported on 05/19/2021), Disp: 30 tablet, Rfl: 1   prochlorperazine (COMPAZINE) 10 MG tablet, Take 1 tablet (10 mg total) by mouth every 6 (six) hours as needed (Nausea or vomiting). (Patient not taking: Reported on 05/19/2021), Disp: 30 tablet, Rfl: 1 No current facility-administered medications for this visit.  Facility-Administered  Medications Ordered in Other Visits:    fluorouracil (ADRUCIL) 4,100 mg in sodium chloride 0.9 % 68 mL chemo infusion, 2,400 mg/m2 (Treatment Plan Recorded), Intravenous, 1 day or 1 dose, Sindy Guadeloupe, MD   fluorouracil (ADRUCIL) chemo injection 700 mg, 400 mg/m2 (Treatment Plan Recorded), Intravenous, Once, Sindy Guadeloupe, MD   leucovorin 700 mg in dextrose 5 % 250 mL infusion, 409 mg/m2 (Treatment Plan Recorded), Intravenous, Once, Sindy Guadeloupe, MD, Last Rate: 143 mL/hr at 05/19/21 1142, 700 mg at 05/19/21 1142   oxaliplatin (ELOXATIN) 150 mg in dextrose 5 % 500 mL chemo infusion, 87 mg/m2 (Treatment Plan Recorded), Intravenous, Once, Sindy Guadeloupe, MD, Last Rate: 265 mL/hr at 05/19/21 1139, 150 mg at 05/19/21 1139  Physical exam:  Vitals:   05/19/21 0914  BP: 116/70  Pulse: 87  Temp: (!) 97 F (36.1 C)  SpO2: 100%  Weight: 144 lb (65.3 kg)   Physical Exam Cardiovascular:     Rate and Rhythm: Normal rate and regular rhythm.     Heart sounds: Normal heart sounds.     Comments: Left chest wall port in place Pulmonary:     Effort:  Pulmonary effort is normal.     Breath sounds: Normal breath sounds.  Abdominal:     General: Bowel sounds are normal.     Palpations: Abdomen is soft.     Comments: Diverting colostomy in place.  Scars of laparoscopy healing well  Skin:    General: Skin is warm and dry.  Neurological:     Mental Status: She is alert and oriented to person, place, and time.     CMP Latest Ref Rng & Units 05/19/2021  Glucose 70 - 99 mg/dL 94  BUN 8 - 23 mg/dL 18  Creatinine 0.44 - 1.00 mg/dL 0.68  Sodium 135 - 145 mmol/L 136  Potassium 3.5 - 5.1 mmol/L 3.8  Chloride 98 - 111 mmol/L 103  CO2 22 - 32 mmol/L 25  Calcium 8.9 - 10.3 mg/dL 8.3(L)  Total Protein 6.5 - 8.1 g/dL 7.3  Total Bilirubin 0.3 - 1.2 mg/dL 0.5  Alkaline Phos 38 - 126 U/L 83  AST 15 - 41 U/L 15  ALT 0 - 44 U/L 9   CBC Latest Ref Rng & Units 05/19/2021  WBC 4.0 - 10.5 K/uL 9.0  Hemoglobin  12.0 - 15.0 g/dL 8.2(L)  Hematocrit 36.0 - 46.0 % 27.0(L)  Platelets 150 - 400 K/uL 401(H)    No images are attached to the encounter.  DG Abd 1 View  Result Date: 05/01/2021 CLINICAL DATA:  Missing needle.  Port-A-Cath insertion. EXAM: ABDOMEN - 1 VIEW; DG C-ARM 1-60 MIN; DG C-ARM 1-60 MIN-NO REPORT COMPARISON:  None. FINDINGS: Multiple C-arm images were obtained of the abdomen, pelvis, chest, and thigh region bilaterally. There is also an image of the tibia and fibula which is not marked as for laterality. Most of the images are degraded by significant motion which would decrease detection of a small needle. Port-A-Cath is in place in the SVC. NG tube in place. Towel clamp is present overlying the left groin region. Surgical instrument overlying the right pelvis. IMPRESSION: Numerous images were obtained however are degraded by significant motion. This decreases the sensitivity for detection of needle. No retained needle is identified. Electronically Signed   By: Franchot Gallo M.D.   On: 05/01/2021 14:10   CT Chest Wo Contrast  Result Date: 05/13/2021 CLINICAL DATA:  Colorectal cancer.  Restaging. EXAM: CT CHEST WITHOUT CONTRAST TECHNIQUE: Multidetector CT imaging of the chest was performed following the standard protocol without IV contrast. COMPARISON:  04/22/2021 FINDINGS: Cardiovascular: The heart size is normal. No substantial pericardial effusion. Right Port-A-Cath tip is positioned in the right innominate vein. Mediastinum/Nodes: No mediastinal lymphadenopathy. No evidence for gross hilar lymphadenopathy although assessment is limited by the lack of intravenous contrast on today's study. The esophagus has normal imaging features. There is no axillary lymphadenopathy. Lungs/Pleura: No suspicious pulmonary nodule or mass. No focal airspace consolidation. No pleural effusion. Chronic atelectasis or linear scarring noted in the lung bases, similar to prior. Upper Abdomen: Large inferior right  hepatic lesion evident, as before measuring 7.1 cm today in the same dimension it was previously measured at 6.4 cm. Musculoskeletal: No worrisome lytic or sclerotic osseous abnormality. Small gas locule identified in the right paramidline upper anterior abdominal wall may represent an injection site. Patient is 11 days out from surgery in this may potentially be related to that procedure although postoperative gas is only expected after about 7-10 days after surgery. IMPRESSION: 1. No findings to suggest metastatic disease in the chest. 2. Large right hepatic lesion consistent with metastatic disease appears mildly  progressive in the interval. 3. Small gas locule in the right paramidline upper anterior abdominal wall may represent an injection site. Patient is 11 days out from surgery in this may potentially be related to that procedure although postoperative gas is only expected after about 7-10 days after surgery. Electronically Signed   By: Misty Stanley M.D.   On: 05/13/2021 07:45   CT CHEST ABDOMEN PELVIS W CONTRAST  Result Date: 04/23/2021 CLINICAL DATA:  Iron deficiency anemia. Fatigue. Mid abdominal pain for 2 weeks. EXAM: CT CHEST, ABDOMEN, AND PELVIS WITH CONTRAST TECHNIQUE: Multidetector CT imaging of the chest, abdomen and pelvis was performed following the standard protocol during bolus administration of intravenous contrast. CONTRAST:  174mL ISOVUE-300 IOPAMIDOL (ISOVUE-300) INJECTION 61% COMPARISON:  None. FINDINGS: CT CHEST FINDINGS Cardiovascular: Heart size upper normal. Trace pericardial fluid. No thoracic aortic aneurysm. Mediastinum/Nodes: No mediastinal lymphadenopathy. No evidence for gross hilar lymphadenopathy although assessment is limited by the lack of intravenous contrast on today's study. The esophagus has normal imaging features. Upper normal lymph nodes identified left axilla. Lungs/Pleura: No suspicious pulmonary nodule or mass. Subsegmental atelectasis is noted in the lung  bases. No pleural effusion. Musculoskeletal: No worrisome lytic or sclerotic osseous abnormality. CT ABDOMEN PELVIS FINDINGS Hepatobiliary: 6.4 cm heterogeneously enhancing mass is identified in the posterior right liver. Just cranial to this lesion is a second right hepatic lobe mass measuring 4.0 cm. There is no evidence for gallstones, gallbladder wall thickening, or pericholecystic fluid. No intrahepatic or extrahepatic biliary dilation. Pancreas: No focal mass lesion. No dilatation of the main duct. No intraparenchymal cyst. No peripancreatic edema. Spleen: No splenomegaly. No focal mass lesion. Adrenals/Urinary Tract: No adrenal nodule or mass. Right kidney unremarkable. Small cyst noted lower pole left kidney. No hydroureteronephrosis. The urinary bladder appears normal for the degree of distention. Stomach/Bowel: Stomach is unremarkable. No gastric wall thickening. No evidence of outlet obstruction. Duodenum is normally positioned as is the ligament of Treitz. No small bowel wall thickening. No small bowel dilatation. The terminal ileum is normal. The appendix is normal. Large volume stool noted right colon transverse colon, and descending colon. Irregular mass lesion noted in the distal sigmoid colon extending into the rectosigmoid junction with lateral extra colonic extension into the pelvic sidewall. There is apparent luminal narrowing at the level of the lesion which likely accounts for the large stool volume more proximally. Multiple pericolonic and perirectal lymph nodes are evident in there is a tubular low-density structure tracking cranially in the presacral space along the inferior mesenteric distribution compatible with a thrombosed vein. Multiple peritoneal nodules are seen on the low right pelvic sidewall. The colon mass appears to directly invade the posterior uterus on sagittal imaging. Vascular/Lymphatic: No abdominal aortic aneurysm. There is no gastrohepatic or hepatoduodenal ligament  lymphadenopathy. No retroperitoneal or mesenteric lymphadenopathy. As described above, there is pericolonic lymphadenopathy around these distal sigmoid colon and abnormal perirectal lymph nodes. Apparent thrombosis of the superior rectal vein may be bland thrombus although direct tumor invasion is not excluded. Reproductive: As above, distal colonic mass appears to directly invade the posterior uterus. Left ovary not discretely visible. Right ovary is probably just posterior to the cecum on image 106/2, unremarkable. Other: No substantial intraperitoneal free fluid. Musculoskeletal: No worrisome lytic or sclerotic osseous abnormality. IMPRESSION: 1. Large mass lesion involving the distal sigmoid colon extending into the rectosigmoid junction and upper rectum, compatible with colorectal neoplasm. Abnormal rim enhancing soft tissue extends directly from this lesion into the left pelvic sidewall suggesting transmural  tumor extension and pelvic sidewall invasion. Multiple abnormal lymph nodes are seen around the distal sigmoid colon and rectum, concerning for metastatic disease. There is thrombosis of the superior rectal vein in the same region which may reflect bland thrombus or direct tumor extension. 2. Large heterogeneously enhancing right liver lesions consistent with metastatic disease. 3. Distal colonic mass appears to directly invade the posterior uterus. 4. Large stool volume in the colon proximal to the distal sigmoid lesion. The sigmoid lesion does appear to narrow the lumen of the colon and the prominent stool volume proximal to the lesion is compatible with significant neoplastic stricture. 5. Above described imaging features are felt to be most consistent with neoplastic etiology. While an infectious/inflammatory etiology with abscesses in the liver could potentially create a similar appearance, this is felt to be less likely given the absence of signs/symptoms of infection. I personally called these  results directly to Dr. Janese Banks at 1306 hours on 04/23/2021. Electronically Signed   By: Misty Stanley M.D.   On: 04/23/2021 13:09   DG CHEST PORT 1 VIEW  Result Date: 05/01/2021 CLINICAL DATA:  Spontaneous portosystemic shunt, port a cath in place. EXAM: PORTABLE CHEST - 1 VIEW COMPARISON:  CT 04/22/2021 FINDINGS: Right IJ power port placement, tip near the confluence of brachiocephalic veins. No pneumothorax. Lungs are clear. Heart size and mediastinal contours are within normal limits. No effusion. Visualized bones unremarkable. IMPRESSION: 1. Port catheter placement as above.  No pneumothorax. Electronically Signed   By: Lucrezia Europe M.D.   On: 05/01/2021 16:01   DG C-Arm 1-60 Min  Result Date: 05/01/2021 CLINICAL DATA:  Missing needle.  Port-A-Cath insertion. EXAM: ABDOMEN - 1 VIEW; DG C-ARM 1-60 MIN; DG C-ARM 1-60 MIN-NO REPORT COMPARISON:  None. FINDINGS: Multiple C-arm images were obtained of the abdomen, pelvis, chest, and thigh region bilaterally. There is also an image of the tibia and fibula which is not marked as for laterality. Most of the images are degraded by significant motion which would decrease detection of a small needle. Port-A-Cath is in place in the SVC. NG tube in place. Towel clamp is present overlying the left groin region. Surgical instrument overlying the right pelvis. IMPRESSION: Numerous images were obtained however are degraded by significant motion. This decreases the sensitivity for detection of needle. No retained needle is identified. Electronically Signed   By: Franchot Gallo M.D.   On: 05/01/2021 14:10   DG C-Arm 1-60 Min-No Report  Result Date: 05/01/2021 CLINICAL DATA:  Missing needle.  Port-A-Cath insertion. EXAM: ABDOMEN - 1 VIEW; DG C-ARM 1-60 MIN; DG C-ARM 1-60 MIN-NO REPORT COMPARISON:  None. FINDINGS: Multiple C-arm images were obtained of the abdomen, pelvis, chest, and thigh region bilaterally. There is also an image of the tibia and fibula which is not marked as  for laterality. Most of the images are degraded by significant motion which would decrease detection of a small needle. Port-A-Cath is in place in the SVC. NG tube in place. Towel clamp is present overlying the left groin region. Surgical instrument overlying the right pelvis. IMPRESSION: Numerous images were obtained however are degraded by significant motion. This decreases the sensitivity for detection of needle. No retained needle is identified. Electronically Signed   By: Franchot Gallo M.D.   On: 05/01/2021 14:10     Assessment and plan- Patient is a 65 y.o. 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  I will plan to see her in 2 weeks for cycle 2.  I will add Avastin after 4-6 treatments to allow for wound healing.  Microcytic anemia: Patient has received 5 doses of Venofer so far.  She is still anemic with a hemoglobin of 8.2.  Folate levels are also down.  I will plan to give her 5 more doses of Venofer and I have sent a prescription for 1 mg folate every day   Visit Diagnosis 1. Metastatic colon cancer in female [facility])   2. Iron deficiency anemia, unspecified iron deficiency anemia type   3. Encounter for antineoplastic chemotherapy      Dr. Randa Evens, MD, MPH [facility] at [facility] [phone] 05/19/2021 1:32 PM

## 2021-05-19 NOTE — Patient Instructions (Signed)
[name]  Discharge Instructions: Thank you for choosing [facility] to provide your oncology and hematology care.  If you have a lab appointment with the [facility], please go directly to the [facility] and check in at the registration area.  Wear comfortable clothing and clothing appropriate for easy access to any Portacath or PICC line.   We strive to give you quality time with your provider. You may need to reschedule your appointment if you arrive late (15 or more minutes).  Arriving late affects you and other patients whose appointments are after yours.  Also, if you miss three or more appointments without notifying the office, you may be dismissed from the clinic at the provider's discretion.      For prescription refill requests, have your pharmacy contact our office and allow 72 hours for refills to be completed.    Today you received the following chemotherapy and/or immunotherapy agents: Oxaliplatin, Leucovorin, Fluorouracil      To help prevent nausea and vomiting after your treatment, we encourage you to take your nausea medication as directed.  BELOW ARE SYMPTOMS THAT SHOULD BE REPORTED IMMEDIATELY: *FEVER GREATER THAN 100.4 F (38 C) OR HIGHER *CHILLS OR SWEATING *NAUSEA AND VOMITING THAT IS NOT CONTROLLED WITH YOUR NAUSEA MEDICATION *UNUSUAL SHORTNESS OF BREATH *UNUSUAL BRUISING OR BLEEDING *URINARY PROBLEMS (pain or burning when urinating, or frequent urination) *BOWEL PROBLEMS (unusual diarrhea, constipation, pain near the anus) TENDERNESS IN MOUTH AND THROAT WITH OR WITHOUT PRESENCE OF ULCERS (sore throat, sores in mouth, or a toothache) UNUSUAL RASH, SWELLING OR PAIN  UNUSUAL VAGINAL DISCHARGE OR ITCHING   Items with * indicate a potential emergency and should be followed up as soon as possible or go to the Emergency Department if any problems should occur.  Please show the CHEMOTHERAPY ALERT CARD or  IMMUNOTHERAPY ALERT CARD at check-in to the Emergency Department and triage nurse.  Should you have questions after your visit or need to cancel or reschedule your appointment, please contact [facility]  [phone] and follow the prompts.  Office hours are 8:00 a.m. to 4:30 p.m. Monday - Friday. Please note that voicemails left after 4:00 p.m. may not be returned until the following business day.  We are closed weekends and major holidays. You have access to a nurse at all times for urgent questions. Please call the main number to the clinic [phone] and follow the prompts.  For any non-urgent questions, you may also contact your provider using MyChart. We now offer e-Visits for anyone 36 and older to request care online for non-urgent symptoms. For details visit [email or web address].   Also download the MyChart app! Go to the app store, search "MyChart", open the app, select [facility], and log in with your MyChart username and password.  Due to Covid, a mask is required upon entering the hospital/clinic. If you do not have a mask, one will be given to you upon arrival. For doctor visits, patients may have 1 support person aged 56 or older with them. For treatment visits, patients cannot have anyone with them due to current Covid guidelines and our immunocompromised population.   Oxaliplatin Injection What is this medication? OXALIPLATIN (ox AL i PLA tin) is a chemotherapy drug. It targets fast dividing cells, like cancer cells, and causes these cells to die. This medicine is usedto treat cancers of the colon and rectum, and many other cancers. This medicine may be used for other purposes;  ask your health care provider orpharmacist if you have questions. COMMON BRAND NAME(S): Eloxatin What should I tell my care team before I take this medication? They need to know if you have any of these conditions: heart disease history of irregular heartbeat liver  disease low blood counts, like white cells, platelets, or red blood cells lung or breathing disease, like asthma take medicines that treat or prevent blood clots tingling of the fingers or toes, or other nerve disorder an unusual or allergic reaction to oxaliplatin, other chemotherapy, other medicines, foods, dyes, or preservatives pregnant or trying to get pregnant breast-feeding How should I use this medication? This drug is given as an infusion into a vein. It is administered in a hospitalor clinic by a specially trained health care professional. Talk to your pediatrician regarding the use of this medicine in children.Special care may be needed. Overdosage: If you think you have taken too much of this medicine contact apoison control center or emergency room at once. NOTE: This medicine is only for you. Do not share this medicine with others. What if I miss a dose? It is important not to miss a dose. Call your doctor or health careprofessional if you are unable to keep an appointment. What may interact with this medication? Do not take this medicine with any of the following medications: cisapride dronedarone pimozide thioridazine This medicine may also interact with the following medications: aspirin and aspirin-like medicines certain medicines that treat or prevent blood clots like warfarin, apixaban, dabigatran, and rivaroxaban cisplatin cyclosporine diuretics medicines for infection like acyclovir, adefovir, amphotericin B, bacitracin, cidofovir, foscarnet, ganciclovir, gentamicin, pentamidine, vancomycin NSAIDs, medicines for pain and inflammation, like ibuprofen or naproxen other medicines that prolong the QT interval (an abnormal heart rhythm) pamidronate zoledronic acid This list may not describe all possible interactions. Give your health care provider a list of all the medicines, herbs, non-prescription drugs, or dietary supplements you use. Also tell them if you smoke,  drink alcohol, or use illegaldrugs. Some items may interact with your medicine. What should I watch for while using this medication? Your condition will be monitored carefully while you are receiving thismedicine. You may need blood work done while you are taking this medicine. This medicine may make you feel generally unwell. This is not uncommon as chemotherapy can affect healthy cells as well as cancer cells. Report any side effects. Continue your course of treatment even though you feel ill unless yourhealthcare professional tells you to stop. This medicine can make you more sensitive to cold. Do not drink cold drinks or use ice. Cover exposed skin before coming in contact with cold temperatures or cold objects. When out in cold weather wear warm clothing and cover your mouth and nose to warm the air that goes into your lungs. Tell your doctor if you getsensitive to the cold. Do not become pregnant while taking this medicine or for 9 months after stopping it. Women should inform their health care professional if they wish to become pregnant or think they might be pregnant. Men should not father a child while taking this medicine and for 6 months after stopping it. There is potential for serious side effects to an unborn child. Talk to your health careprofessional for more information. Do not breast-feed a child while taking this medicine or for 3 months afterstopping it. This medicine has caused ovarian failure in some women. This medicine may make it more difficult to get pregnant. Talk to your health care professional if [place] concerned about  your fertility. This medicine has caused decreased sperm counts in some men. This may make it more difficult to father a child. Talk to your health care professional if [place] concerned about your fertility. This medicine may increase your risk of getting an infection. Call your health care professional for advice if you get a fever, chills, or sore throat, or  other symptoms of a cold or flu. Do not treat yourself. Try to avoid beingaround people who are sick. Avoid taking medicines that contain aspirin, acetaminophen, ibuprofen, naproxen, or ketoprofen unless instructed by your health care professional.These medicines may hide a fever. Be careful brushing or flossing your teeth or using a toothpick because you may get an infection or bleed more easily. If you have any dental work done, Primary school teacher you are receiving this medicine. What side effects may I notice from receiving this medication? Side effects that you should report to your doctor or health care professionalas soon as possible: allergic reactions like skin rash, itching or hives, swelling of the face, lips, or tongue breathing problems cough low blood counts - this medicine may decrease the number of white blood cells, red blood cells, and platelets. You may be at increased risk for infections and bleeding nausea, vomiting pain, redness, or irritation at site where injected pain, tingling, numbness in the hands or feet signs and symptoms of bleeding such as bloody or black, tarry stools; red or dark brown urine; spitting up blood or brown material that looks like coffee grounds; red spots on the skin; unusual bruising or bleeding from the eyes, gums, or nose signs and symptoms of a dangerous change in heartbeat or heart rhythm like chest pain; dizziness; fast, irregular heartbeat; palpitations; feeling faint or lightheaded; falls signs and symptoms of infection like fever; chills; cough; sore throat; pain or trouble passing urine signs and symptoms of liver injury like dark yellow or brown urine; general ill feeling or flu-like symptoms; light-colored stools; loss of appetite; nausea; right upper belly pain; unusually weak or tired; yellowing of the eyes or skin signs and symptoms of low red blood cells or anemia such as unusually weak or tired; feeling faint or lightheaded; falls signs  and symptoms of muscle injury like dark urine; trouble passing urine or change in the amount of urine; unusually weak or tired; muscle pain; back pain Side effects that usually do not require medical attention (report to yourdoctor or health care professional if they continue or are bothersome): changes in taste diarrhea gas hair loss loss of appetite mouth sores This list may not describe all possible side effects. Call your doctor for medical advice about side effects. You may report side effects to FDA at1-800-FDA-1088. Where should I keep my medication? This drug is given in a hospital or clinic and will not be stored at home. NOTE: This sheet is a summary. It may not cover all possible information. If you have questions about this medicine, talk to your doctor, pharmacist, orhealth care provider.  2022 Elsevier/Gold Standard (2019-04-05 12:20:35) Leucovorin injection What is this medication? LEUCOVORIN (loo koe VOR in) is used to prevent or treat the harmful effects of some medicines. This medicine is used to treat anemia caused by a low amount of folic acid in the body. It is also used with 5-fluorouracil (5-FU) to treatcolon cancer. This medicine may be used for other purposes; ask your health care provider orpharmacist if you have questions. What should I tell my care team before I take this medication? They need  to know if you have any of these conditions: anemia from low levels of vitamin B-12 in the blood an unusual or allergic reaction to leucovorin, folic acid, other medicines, foods, dyes, or preservatives pregnant or trying to get pregnant breast-feeding How should I use this medication? This medicine is for injection into a muscle or into a vein. It is given by ahealth care professional in a hospital or clinic setting. Talk to your pediatrician regarding the use of this medicine in children.Special care may be needed. Overdosage: If you think you have taken too much of this  medicine contact apoison control center or emergency room at once. NOTE: This medicine is only for you. Do not share this medicine with others. What if I miss a dose? This does not apply. What may interact with this medication? capecitabine fluorouracil phenobarbital phenytoin primidone trimethoprim-sulfamethoxazole This list may not describe all possible interactions. Give your health care provider a list of all the medicines, herbs, non-prescription drugs, or dietary supplements you use. Also tell them if you smoke, drink alcohol, or use illegaldrugs. Some items may interact with your medicine. What should I watch for while using this medication? Your condition will be monitored carefully while you are receiving thismedicine. This medicine may increase the side effects of 5-fluorouracil, 5-FU. Tell your doctor or health care professional if you have diarrhea or mouth sores that donot get better or that get worse. What side effects may I notice from receiving this medication? Side effects that you should report to your doctor or health care professionalas soon as possible: allergic reactions like skin rash, itching or hives, swelling of the face, lips, or tongue breathing problems fever, infection mouth sores unusual bleeding or bruising unusually weak or tired Side effects that usually do not require medical attention (report to yourdoctor or health care professional if they continue or are bothersome): constipation or diarrhea loss of appetite nausea, vomiting This list may not describe all possible side effects. Call your doctor for medical advice about side effects. You may report side effects to FDA at1-800-FDA-1088. Where should I keep my medication? This drug is given in a hospital or clinic and will not be stored at home. NOTE: This sheet is a summary. It may not cover all possible information. If you have questions about this medicine, talk to your doctor, pharmacist, orhealth  care provider.  2022 Elsevier/Gold Standard (2008-05-22 16:50:29)  Fluorouracil, 5-FU injection What is this medication? FLUOROURACIL, 5-FU (flure oh YOOR a sil) is a chemotherapy drug. It slows the growth of cancer cells. This medicine is used to treat many types of cancer like breast cancer, colon or rectal cancer, pancreatic cancer, and stomachcancer. This medicine may be used for other purposes; ask your health care provider orpharmacist if you have questions. COMMON BRAND NAME(S): Adrucil What should I tell my care team before I take this medication? They need to know if you have any of these conditions: blood disorders dihydropyrimidine dehydrogenase (DPD) deficiency infection (especially a virus infection such as chickenpox, cold sores, or herpes) kidney disease liver disease malnourished, poor nutrition recent or ongoing radiation therapy an unusual or allergic reaction to fluorouracil, other chemotherapy, other medicines, foods, dyes, or preservatives pregnant or trying to get pregnant breast-feeding How should I use this medication? This drug is given as an infusion or injection into a vein. It is administeredin a hospital or clinic by a specially trained health care professional. Talk to your pediatrician regarding the use of this medicine in children.Special care  may be needed. Overdosage: If you think you have taken too much of this medicine contact apoison control center or emergency room at once. NOTE: This medicine is only for you. Do not share this medicine with others. What if I miss a dose? It is important not to miss your dose. Call your doctor or health careprofessional if you are unable to keep an appointment. What may interact with this medication? Do not take this medicine with any of the following medications: live virus vaccines This medicine may also interact with the following medications: medicines that treat or prevent blood clots like warfarin,  enoxaparin, and dalteparin This list may not describe all possible interactions. Give your health care provider a list of all the medicines, herbs, non-prescription drugs, or dietary supplements you use. Also tell them if you smoke, drink alcohol, or use illegaldrugs. Some items may interact with your medicine. What should I watch for while using this medication? Visit your doctor for checks on your progress. This drug may make you feel generally unwell. This is not uncommon, as chemotherapy can affect healthy cells as well as cancer cells. Report any side effects. Continue your course oftreatment even though you feel ill unless your doctor tells you to stop. In some cases, you may be given additional medicines to help with side effects.Follow all directions for their use. Call your doctor or health care professional for advice if you get a fever, chills or sore throat, or other symptoms of a cold or flu. Do not treat yourself. This drug decreases your body's ability to fight infections. Try toavoid being around people who are sick. This medicine may increase your risk to bruise or bleed. Call your doctor orhealth care professional if you notice any unusual bleeding. Be careful brushing and flossing your teeth or using a toothpick because you may get an infection or bleed more easily. If you have any dental work done,tell your dentist you are receiving this medicine. Avoid taking products that contain aspirin, acetaminophen, ibuprofen, naproxen, or ketoprofen unless instructed by your doctor. These medicines may hide afever. Do not become pregnant while taking this medicine. Women should inform their doctor if they wish to become pregnant or think they might be pregnant. There is a potential for serious side effects to an unborn child. Talk to your health care professional or pharmacist for more information. Do not breast-feed aninfant while taking this medicine. Men should inform their doctor if they wish  to father a child. This medicinemay lower sperm counts. Do not treat diarrhea with over the counter products. Contact your doctor ifyou have diarrhea that lasts more than 2 days or if it is severe and watery. This medicine can make you more sensitive to the sun. Keep out of the sun. If you cannot avoid being in the sun, wear protective clothing and use sunscreen.Do not use sun lamps or tanning beds/booths. What side effects may I notice from receiving this medication? Side effects that you should report to your doctor or health care professionalas soon as possible: allergic reactions like skin rash, itching or hives, swelling of the face, lips, or tongue low blood counts - this medicine may decrease the number of white blood cells, red blood cells and platelets. You may be at increased risk for infections and bleeding. signs of infection - fever or chills, cough, sore throat, pain or difficulty passing urine signs of decreased platelets or bleeding - bruising, pinpoint red spots on the skin, black, tarry stools, blood in the  urine signs of decreased red blood cells - unusually weak or tired, fainting spells, lightheadedness breathing problems changes in vision chest pain mouth sores nausea and vomiting pain, swelling, redness at site where injected pain, tingling, numbness in the hands or feet redness, swelling, or sores on hands or feet stomach pain unusual bleeding Side effects that usually do not require medical attention (report to yourdoctor or health care professional if they continue or are bothersome): changes in finger or toe nails diarrhea dry or itchy skin hair loss headache loss of appetite sensitivity of eyes to the light stomach upset unusually teary eyes This list may not describe all possible side effects. Call your doctor for medical advice about side effects. You may report side effects to FDA at1-800-FDA-1088. Where should I keep my medication? This drug is given in  a hospital or clinic and will not be stored at home. NOTE: This sheet is a summary. It may not cover all possible information. If you have questions about this medicine, talk to your doctor, pharmacist, orhealth care provider.  2022 Elsevier/Gold Standard (2019-10-17 15:00:03)

## 2021-05-20 LAB — HEPATITIS B SURFACE ANTIBODY, QUANTITATIVE: Hep B S AB Quant (Post): <3.1 m[IU]/mL — ABNORMAL LOW (ref >9.9)

## 2021-05-21 ENCOUNTER — Other Ambulatory Visit: Payer: Self-pay | Admitting: *Deleted

## 2021-05-21 ENCOUNTER — Inpatient Hospital Stay: Payer: BC Managed Care – PPO

## 2021-05-21 DIAGNOSIS — C189 Malignant neoplasm of colon, unspecified: Secondary | ICD-10-CM

## 2021-05-21 DIAGNOSIS — Z5111 Encounter for antineoplastic chemotherapy: Secondary | ICD-10-CM | POA: Diagnosis not present

## 2021-05-21 DIAGNOSIS — D509 Iron deficiency anemia, unspecified: Secondary | ICD-10-CM

## 2021-05-21 MED ORDER — HEPARIN SOD (PORK) LOCK FLUSH 100 UNIT/ML IV SOLN
500.0000 [IU] | Freq: Once | INTRAVENOUS | Status: AC | PRN
  Administered 2021-05-21: 500 [IU]
  Filled 2021-05-21: qty 5

## 2021-05-21 MED ORDER — SODIUM CHLORIDE 0.9% FLUSH
10.0000 mL | INTRAVENOUS | Status: DC | PRN
  Administered 2021-05-21: 10 mL
  Filled 2021-05-21: qty 10

## 2021-05-21 MED ORDER — HEPARIN SOD (PORK) LOCK FLUSH 100 UNIT/ML IV SOLN
INTRAVENOUS | Status: AC
  Filled 2021-05-21: qty 5

## 2021-05-22 ENCOUNTER — Telehealth: Payer: Self-pay

## 2021-05-22 NOTE — Telephone Encounter (Signed)
Telephone call to patient for follow up after receiving first infusion.   No answer but left message stating we were calling to check on them.  Encouraged patient to call for any questions or concerns.   

## 2021-05-23 ENCOUNTER — Ambulatory Visit: Payer: BC Managed Care – PPO | Admitting: Oncology

## 2021-05-23 ENCOUNTER — Other Ambulatory Visit: Payer: BC Managed Care – PPO

## 2021-05-27 ENCOUNTER — Ambulatory Visit: Payer: BC Managed Care – PPO | Admitting: Gastroenterology

## 2021-06-03 ENCOUNTER — Encounter: Payer: Self-pay | Admitting: Oncology

## 2021-06-03 ENCOUNTER — Inpatient Hospital Stay (HOSPITAL_BASED_OUTPATIENT_CLINIC_OR_DEPARTMENT_OTHER): Payer: BC Managed Care – PPO | Admitting: Oncology

## 2021-06-03 ENCOUNTER — Inpatient Hospital Stay: Payer: BC Managed Care – PPO | Attending: Oncology

## 2021-06-03 ENCOUNTER — Inpatient Hospital Stay: Payer: BC Managed Care – PPO

## 2021-06-03 VITALS — BP 98/67 | HR 80 | Temp 97.9°F | Resp 20 | Wt 135.5 lb

## 2021-06-03 DIAGNOSIS — Z5111 Encounter for antineoplastic chemotherapy: Secondary | ICD-10-CM | POA: Insufficient documentation

## 2021-06-03 DIAGNOSIS — C787 Secondary malignant neoplasm of liver and intrahepatic bile duct: Secondary | ICD-10-CM | POA: Diagnosis present

## 2021-06-03 DIAGNOSIS — D509 Iron deficiency anemia, unspecified: Secondary | ICD-10-CM | POA: Diagnosis not present

## 2021-06-03 DIAGNOSIS — C189 Malignant neoplasm of colon, unspecified: Secondary | ICD-10-CM

## 2021-06-03 DIAGNOSIS — C187 Malignant neoplasm of sigmoid colon: Secondary | ICD-10-CM | POA: Diagnosis present

## 2021-06-03 DIAGNOSIS — E538 Deficiency of other specified B group vitamins: Secondary | ICD-10-CM | POA: Insufficient documentation

## 2021-06-03 DIAGNOSIS — C786 Secondary malignant neoplasm of retroperitoneum and peritoneum: Secondary | ICD-10-CM | POA: Diagnosis not present

## 2021-06-03 DIAGNOSIS — Z452 Encounter for adjustment and management of vascular access device: Secondary | ICD-10-CM | POA: Insufficient documentation

## 2021-06-03 LAB — CBC WITH DIFFERENTIAL/PLATELET
Abs Immature Granulocytes: 0.02 10*3/uL (ref 0.00–0.07)
Basophils Absolute: 0.1 10*3/uL (ref 0.0–0.1)
Basophils Relative: 1 %
Eosinophils Absolute: 0.2 10*3/uL (ref 0.0–0.5)
Eosinophils Relative: 2 %
HCT: 28.7 % — ABNORMAL LOW (ref 36.0–46.0)
Hemoglobin: 8.8 g/dL — ABNORMAL LOW (ref 12.0–15.0)
Immature Granulocytes: 0 %
Lymphocytes Relative: 12 %
Lymphs Abs: 0.9 10*3/uL (ref 0.7–4.0)
MCH: 24 pg — ABNORMAL LOW (ref 26.0–34.0)
MCHC: 30.7 g/dL (ref 30.0–36.0)
MCV: 78.4 fL — ABNORMAL LOW (ref 80.0–100.0)
Monocytes Absolute: 0.7 10*3/uL (ref 0.1–1.0)
Monocytes Relative: 10 %
Neutro Abs: 5.7 10*3/uL (ref 1.7–7.7)
Neutrophils Relative %: 75 %
Platelets: 199 10*3/uL (ref 150–400)
RBC: 3.66 MIL/uL — ABNORMAL LOW (ref 3.87–5.11)
RDW: 25.2 % — ABNORMAL HIGH (ref 11.5–15.5)
WBC: 7.6 10*3/uL (ref 4.0–10.5)
nRBC: 0 % (ref 0.0–0.2)

## 2021-06-03 LAB — COMPREHENSIVE METABOLIC PANEL
ALT: 7 U/L (ref 0–44)
AST: 15 U/L (ref 15–41)
Albumin: 2.9 g/dL — ABNORMAL LOW (ref 3.5–5.0)
Alkaline Phosphatase: 82 U/L (ref 38–126)
Anion gap: 5 (ref 5–15)
BUN: 15 mg/dL (ref 8–23)
CO2: 27 mmol/L (ref 22–32)
Calcium: 8.5 mg/dL — ABNORMAL LOW (ref 8.9–10.3)
Chloride: 103 mmol/L (ref 98–111)
Creatinine, Ser: 0.74 mg/dL (ref 0.44–1.00)
GFR, Estimated: >60 mL/min (ref >60)
Glucose, Bld: 95 mg/dL (ref 70–99)
Potassium: 3.8 mmol/L (ref 3.5–5.1)
Sodium: 135 mmol/L (ref 135–145)
Total Bilirubin: 0.4 mg/dL (ref 0.3–1.2)
Total Protein: 7 g/dL (ref 6.5–8.1)

## 2021-06-03 LAB — VITAMIN B12: Vitamin B-12: 188 pg/mL (ref 180–914)

## 2021-06-03 LAB — IRON AND TIBC
Iron: 37 ug/dL (ref 28–170)
Saturation Ratios: 14 % (ref 10.4–31.8)
TIBC: 265 ug/dL (ref 250–450)
UIBC: 228 ug/dL

## 2021-06-03 MED ORDER — SODIUM CHLORIDE 0.9 % IV SOLN: 200.0000 mg | INTRAVENOUS | Status: DC

## 2021-06-03 MED ORDER — PALONOSETRON HCL INJECTION 0.25 MG/5ML
0.2500 mg | Freq: Once | INTRAVENOUS | Status: AC
  Administered 2021-06-03: 0.25 mg via INTRAVENOUS
  Filled 2021-06-03: qty 5

## 2021-06-03 MED ORDER — SODIUM CHLORIDE 0.9 % IV SOLN
2400.0000 mg/m2 | INTRAVENOUS | Status: DC
  Administered 2021-06-03: 4100 mg via INTRAVENOUS
  Filled 2021-06-03: qty 82

## 2021-06-03 MED ORDER — SODIUM CHLORIDE 0.9 % IV SOLN
10.0000 mg | Freq: Once | INTRAVENOUS | Status: AC
  Administered 2021-06-03: 10 mg via INTRAVENOUS
  Filled 2021-06-03: qty 10

## 2021-06-03 MED ORDER — OXALIPLATIN CHEMO INJECTION 100 MG/20ML
150.0000 mg | Freq: Once | INTRAVENOUS | Status: AC
  Administered 2021-06-03: 150 mg via INTRAVENOUS
  Filled 2021-06-03: qty 20

## 2021-06-03 MED ORDER — IRON SUCROSE 20 MG/ML IV SOLN
200.0000 mg | Freq: Once | INTRAVENOUS | Status: AC
Start: 2021-06-03 — End: 2021-06-03
  Administered 2021-06-03: 200 mg via INTRAVENOUS
  Filled 2021-06-03: qty 10

## 2021-06-03 MED ORDER — FLUOROURACIL CHEMO INJECTION 2.5 GM/50ML
400.0000 mg/m2 | Freq: Once | INTRAVENOUS | Status: AC
  Administered 2021-06-03: 700 mg via INTRAVENOUS
  Filled 2021-06-03: qty 14

## 2021-06-03 MED ORDER — LEUCOVORIN CALCIUM INJECTION 350 MG
700.0000 mg | Freq: Once | INTRAVENOUS | Status: AC
  Administered 2021-06-03: 700 mg via INTRAVENOUS
  Filled 2021-06-03: qty 35

## 2021-06-03 MED ORDER — DEXTROSE 5 % IV SOLN
Freq: Once | INTRAVENOUS | Status: AC
  Filled 2021-06-03: qty 250

## 2021-06-03 MED ORDER — SODIUM CHLORIDE 0.9 % IV SOLN
Freq: Once | INTRAVENOUS | Status: AC
  Filled 2021-06-03: qty 250

## 2021-06-03 NOTE — Patient Instructions (Signed)
[facility]  Discharge Instructions: Thank you for choosing [facility] to provide your oncology and hematology care.  If you have a lab appointment with the [facility], please go directly to the [facility] and check in at the registration area.  Wear comfortable clothing and clothing appropriate for easy access to any Portacath or PICC line.   We strive to give you quality time with your provider. You may need to reschedule your appointment if you arrive late (15 or more minutes).  Arriving late affects you and other patients whose appointments are after yours.  Also, if you miss three or more appointments without notifying the office, you may be dismissed from the clinic at the provider's discretion.      For prescription refill requests, have your pharmacy contact our office and allow 72 hours for refills to be completed.    Today you received the following chemotherapy and/or immunotherapy agents : Folfox / Venofer   To help prevent nausea and vomiting after your treatment, we encourage you to take your nausea medication as directed.  BELOW ARE SYMPTOMS THAT SHOULD BE REPORTED IMMEDIATELY: *FEVER GREATER THAN 100.4 F (38 C) OR HIGHER *CHILLS OR SWEATING *NAUSEA AND VOMITING THAT IS NOT CONTROLLED WITH YOUR NAUSEA MEDICATION *UNUSUAL SHORTNESS OF BREATH *UNUSUAL BRUISING OR BLEEDING *URINARY PROBLEMS (pain or burning when urinating, or frequent urination) *BOWEL PROBLEMS (unusual diarrhea, constipation, pain near the anus) TENDERNESS IN MOUTH AND THROAT WITH OR WITHOUT PRESENCE OF ULCERS (sore throat, sores in mouth, or a toothache) UNUSUAL RASH, SWELLING OR PAIN  UNUSUAL VAGINAL DISCHARGE OR ITCHING   Items with * indicate a potential emergency and should be followed up as soon as possible or go to the Emergency Department if any problems should occur.  Please show the CHEMOTHERAPY ALERT CARD or IMMUNOTHERAPY ALERT CARD at  check-in to the Emergency Department and triage nurse.  Should you have questions after your visit or need to cancel or reschedule your appointment, please contact [facility]  [phone] and follow the prompts.  Office hours are 8:00 a.m. to 4:30 p.m. Monday - Friday. Please note that voicemails left after 4:00 p.m. may not be returned until the following business day.  We are closed weekends and major holidays. You have access to a nurse at all times for urgent questions. Please call the main number to the clinic [phone] and follow the prompts.  For any non-urgent questions, you may also contact your provider using MyChart. We now offer e-Visits for anyone 64 and older to request care online for non-urgent symptoms. For details visit [email or web address].   Also download the MyChart app! Go to the app store, search "MyChart", open the app, select [facility], and log in with your MyChart username and password.  Due to Covid, a mask is required upon entering the hospital/clinic. If you do not have a mask, one will be given to you upon arrival. For doctor visits, patients may have 1 support person aged 64 or older with them. For treatment visits, patients cannot have anyone with them due to current Covid guidelines and our immunocompromised population.

## 2021-06-03 NOTE — Progress Notes (Signed)
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  She received IV Venofer for that.  I was concerned about malignancy given the degree of anemia and therefore obtain CT abdomen and pelvis with contrast CT showed 6.4 cm enhancing mass in the right liver and another mass measuring 4 cm.  Large volume stool in the right colon transverse and descending colon with irregular mass lesion in the distal sigmoid colon extending into the rectosigmoid junction with lateral extracolonic extension into the pelvic sidewall.  Apparent luminal narrowing at the level of the lesion which accounts for large volume of stool proximally.  Multiple pericolonic and perirectal lymph nodes.  Multiple peritoneal nodules are seen in the lower right pelvic sidewall.  Colon mass appears to be invading posterior uterus.Thrombosis of the superior rectal vein may be bland thrombus although direct tumor invasion not excluded.   Patient had a flexible sigmoidoscopy which showed a large obstructing mass in the rectosigmoid colon.  Scope could not be traversed beyond the obstruction.  Biopsies were taken and were consistent with adenocarcinoma. Patient had a diverting colostomy on 05/01/2021 with Dr. Dahlia Byes    Interval history-reports having tolerated cycle 1 of chemotherapy well.  Denies any significant abdominal pain.  Colostomy is functioning well.  ECOG PS- 1 Pain scale- 0 Opioid  associated constipation- no  Review of systems- Review of Systems  Constitutional:  Negative for chills, fever, malaise/fatigue and weight loss.  HENT:  Negative for congestion, ear discharge and nosebleeds.   Eyes:  Negative for blurred vision.  Respiratory:  Negative for cough, hemoptysis, sputum production, shortness of breath and wheezing.   Cardiovascular:  Negative for chest pain, palpitations, orthopnea and claudication.  Gastrointestinal:  Negative for abdominal pain, blood in stool, constipation, diarrhea, heartburn, melena, nausea and vomiting.  Genitourinary:  Negative for dysuria, flank pain, frequency, hematuria and urgency.  Musculoskeletal:  Negative for back pain, joint pain and myalgias.  Skin:  Negative for rash.  Neurological:  Negative for dizziness, tingling, focal weakness, seizures, weakness and headaches.  Endo/Heme/Allergies:  Does not bruise/bleed easily.  Psychiatric/Behavioral:  Negative for depression and suicidal ideas. The patient does not have insomnia.      No Known Allergies   Past Medical History:  Diagnosis Date   Allergy    Anemia      Past Surgical History:  Procedure Laterality Date   FLEXIBLE SIGMOIDOSCOPY N/A 04/24/2021   Procedure: FLEXIBLE SIGMOIDOSCOPY;  Surgeon: [name], MD;  Location: [facility] ENDOSCOPY;  Service: Gastroenterology;  Laterality: N/A;   PORTACATH PLACEMENT N/A 05/01/2021   Procedure: INSERTION PORT-A-CATH;  Surgeon: Jules Husbands, MD;  Location: [facility] ORS;  Service: General;  Laterality: N/A;    Social History   Socioeconomic History   Marital status: Married    Spouse name: Not on file   Number of children: Not on file   Years of education: Not on file   Highest education level: Not on file  Occupational History   Not on file  Tobacco Use   Smoking status: Never   Smokeless tobacco: Never  Vaping Use   Vaping Use: Never used  Substance and Sexual Activity   Alcohol use: Not Currently   Drug use: Never    Sexual activity: Not Currently  Other Topics Concern   Not on file  Social History Narrative   Not on file   Social Determinants of Health   Financial Resource Strain: Not on file  Food Insecurity: Not on file  Transportation Needs: Not on file  Physical Activity: Not on file  Stress: Not on file  Social Connections: Not on file  Intimate Partner Violence: Not on file    Family History  Problem Relation Age of Onset   Stroke Mother    Hypertension Mother    Cancer Father      Current Outpatient Medications:    dexamethasone (DECADRON) 4 MG tablet, Take 2 tablets (8 mg total) by mouth daily. Start the day after chemotherapy for 2 days. Take with food., Disp: 30 tablet, Rfl: 1   folic acid (FOLVITE) 1 MG tablet, Take 1 tablet (1 mg total) by mouth daily., Disp: 30 tablet, Rfl: 3   ibuprofen (ADVIL) 600 MG tablet, Take 1 tablet (600 mg total) by mouth every 6 (six) hours as needed., Disp: 30 tablet, Rfl: 0   lidocaine-prilocaine (EMLA) cream, Apply to affected area once, Disp: 30 g, Rfl: 3   LORazepam (ATIVAN) 0.5 MG tablet, Take 1 tablet (0.5 mg total) by mouth every 6 (six) hours as needed (Nausea or vomiting). (Patient not taking: No sig reported), Disp: 30 tablet, Rfl: 0   ondansetron (ZOFRAN) 8 MG tablet, Take 1 tablet (8 mg total) by mouth 2 (two) times daily as needed for refractory nausea / vomiting. Start on day 3 after chemotherapy. (Patient not taking: No sig reported), Disp: 30 tablet, Rfl: 1   prochlorperazine (COMPAZINE) 10 MG tablet, Take 1 tablet (10 mg total) by mouth every 6 (six) hours as needed (Nausea or vomiting). (Patient not taking: No sig reported), Disp: 30 tablet, Rfl: 1 No current facility-administered medications for this visit.  Facility-Administered Medications Ordered in Other Visits:    fluorouracil (ADRUCIL) 4,100 mg in sodium chloride 0.9 % 68 mL chemo infusion, 2,400 mg/m2 (Treatment Plan Recorded), Intravenous, 1 day or 1 dose, Sindy Guadeloupe, MD,  4,100 mg at 06/03/21 1525  Physical exam:  Vitals:   06/03/21 1105  BP: 98/67  Pulse: 80  Resp: 20  Temp: 97.9 F (36.6 C)  TempSrc: Tympanic  SpO2: 100%  Weight: 135 lb 8 oz (61.5 kg)   Physical Exam Constitutional:      General: She is not in acute distress. Cardiovascular:     Rate and Rhythm: Normal rate and regular rhythm.     Heart sounds: Normal heart sounds.  Pulmonary:     Effort: Pulmonary effort is normal.     Breath sounds: Normal breath sounds.  Abdominal:     General: Bowel sounds are normal.     Palpations: Abdomen is soft.  Skin:    General: Skin is warm and dry.  Neurological:     Mental Status: She is alert and oriented to person, place, and time.     CMP Latest Ref Rng & Units 06/03/2021  Glucose 70 - 99 mg/dL 95  BUN 8 - 23 mg/dL 15  Creatinine 0.44 - 1.00 mg/dL 0.74  Sodium 135 - 145 mmol/L 135  Potassium 3.5 - 5.1 mmol/L 3.8  Chloride 98 -  111 mmol/L 103  CO2 22 - 32 mmol/L 27  Calcium 8.9 - 10.3 mg/dL 8.5(L)  Total Protein 6.5 - 8.1 g/dL 7.0  Total Bilirubin 0.3 - 1.2 mg/dL 0.4  Alkaline Phos 38 - 126 U/L 82  AST 15 - 41 U/L 15  ALT 0 - 44 U/L 7   CBC Latest Ref Rng & Units 06/03/2021  WBC 4.0 - 10.5 K/uL 7.6  Hemoglobin 12.0 - 15.0 g/dL 8.8(L)  Hematocrit 36.0 - 46.0 % 28.7(L)  Platelets 150 - 400 K/uL 199    No images are attached to the encounter.  CT Chest Wo Contrast  Result Date: 05/13/2021 CLINICAL DATA:  Colorectal cancer.  Restaging. EXAM: CT CHEST WITHOUT CONTRAST TECHNIQUE: Multidetector CT imaging of the chest was performed following the standard protocol without IV contrast. COMPARISON:  04/22/2021 FINDINGS: Cardiovascular: The heart size is normal. No substantial pericardial effusion. Right Port-A-Cath tip is positioned in the right innominate vein. Mediastinum/Nodes: No mediastinal lymphadenopathy. No evidence for gross hilar lymphadenopathy although assessment is limited by the lack of intravenous contrast on today's study.  The esophagus has normal imaging features. There is no axillary lymphadenopathy. Lungs/Pleura: No suspicious pulmonary nodule or mass. No focal airspace consolidation. No pleural effusion. Chronic atelectasis or linear scarring noted in the lung bases, similar to prior. Upper Abdomen: Large inferior right hepatic lesion evident, as before measuring 7.1 cm today in the same dimension it was previously measured at 6.4 cm. Musculoskeletal: No worrisome lytic or sclerotic osseous abnormality. Small gas locule identified in the right paramidline upper anterior abdominal wall may represent an injection site. Patient is 11 days out from surgery in this may potentially be related to that procedure although postoperative gas is only expected after about 7-10 days after surgery. IMPRESSION: 1. No findings to suggest metastatic disease in the chest. 2. Large right hepatic lesion consistent with metastatic disease appears mildly progressive in the interval. 3. Small gas locule in the right paramidline upper anterior abdominal wall may represent an injection site. Patient is 11 days out from surgery in this may potentially be related to that procedure although postoperative gas is only expected after about 7-10 days after surgery. Electronically Signed   By: Misty Stanley M.D.   On: 05/13/2021 07:45     Assessment and plan- Patient is a 65 y.o. female with metastatic adenocarcinoma of the sigmoid colon/rectum with peritoneal and liver metastases here for on treatment assessment prior to cycle 2 of palliative FOLFOX chemotherapy  Counts okay to proceed with cycle 2 of palliative FOLFOX chemotherapy today with pump disconnect on day 3.  I will see her back in 2 weeks for cycle 3.  I will plan to add a Avastin after 5-6 cycles of FOLFOX to allow for wound healing  Patient has ongoing microcytic anemia which is likely a combination of iron deficiency and anemia of chronic disease.  Her ferritin levels are normal at 232 Which  can be an acute phase reactant.  Iron studies still show a low iron saturation of 14%.  Given her ongoing microcytosis as well I will plan to give her 5 doses of Venofer.  Discussed risks and benefits of IV iron including all but not limited to headache, leg spasms and possible risk of infusion reaction.  Patient understands and agrees to proceed as planned.  We will coordinate her Venofer appointments with her chemotherapies in the future   Visit Diagnosis 1. Metastatic colon cancer in female [facility])   2. Encounter for antineoplastic  chemotherapy   3. Microcytic anemia      Dr. Randa Evens, MD, MPH [facility] at [facility] [phone] 06/03/2021 4:19 PM

## 2021-06-05 ENCOUNTER — Inpatient Hospital Stay: Payer: BC Managed Care – PPO

## 2021-06-05 VITALS — BP 110/80 | HR 75

## 2021-06-05 DIAGNOSIS — C189 Malignant neoplasm of colon, unspecified: Secondary | ICD-10-CM

## 2021-06-05 DIAGNOSIS — Z5111 Encounter for antineoplastic chemotherapy: Secondary | ICD-10-CM | POA: Diagnosis not present

## 2021-06-05 MED ORDER — SODIUM CHLORIDE 0.9% FLUSH
10.0000 mL | INTRAVENOUS | Status: DC | PRN
  Administered 2021-06-05: 10 mL
  Filled 2021-06-05: qty 10

## 2021-06-05 MED ORDER — HEPARIN SOD (PORK) LOCK FLUSH 100 UNIT/ML IV SOLN
500.0000 [IU] | Freq: Once | INTRAVENOUS | Status: AC | PRN
  Administered 2021-06-05: 500 [IU]
  Filled 2021-06-05: qty 5

## 2021-06-05 NOTE — Patient Instructions (Signed)
CANCER CENTER [place] REG[facility]AL MEDICAL ONCOLOGY  Discharge Instructions: Thank you for choosing [facility] to provide your oncology and hematology care.  If you have a lab appointment with the Cancer Center, please go directly to the Cancer Center and check in at the registration area.  Wear comfortable clothing and clothing appropriate for easy access to any Portacath or PICC line.   We strive to give you quality time with your provider. You may need to reschedule your appointment if you arrive late (15 or more minutes).  Arriving late affects you and other patients whose appointments are after yours.  Also, if you miss three or more appointments without notifying the office, you may be dismissed from the clinic at the provider's discretion.      For prescription refill requests, have your pharmacy contact our office and allow 72 hours for refills to be completed.      To help prevent nausea and vomiting after your treatment, we encourage you to take your nausea medication as directed.  BELOW ARE SYMPTOMS THAT SHOULD BE REPORTED IMMEDIATELY: *FEVER GREATER THAN 100.4 F (38 C) OR HIGHER *CHILLS OR SWEATING *NAUSEA AND VOMITING THAT IS NOT CONTROLLED WITH YOUR NAUSEA MEDICATION *UNUSUAL SHORTNESS OF BREATH *UNUSUAL BRUISING OR BLEEDING *URINARY PROBLEMS (pain or burning when urinating, or frequent urination) *BOWEL PROBLEMS (unusual diarrhea, constipation, pain near the anus) TENDERNESS IN MOUTH AND THROAT WITH OR WITHOUT PRESENCE OF ULCERS (sore throat, sores in mouth, or a toothache) UNUSUAL RASH, SWELLING OR PAIN  UNUSUAL VAGINAL DISCHARGE OR ITCHING   Items with * indicate a potential emergency and should be followed up as soon as possible or go to the Emergency Department if any problems should occur.  Please show the CHEMOTHERAPY ALERT CARD or IMMUNOTHERAPY ALERT CARD at check-in to the Emergency Department and triage nurse.  Should you have questions after your  visit or need to cancel or reschedule your appointment, please contact CANCER CENTER [place] REG[facility]AL MEDICAL ONCOLOGY  [phone] and follow the prompts.  Office hours are 8:00 a.m. to 4:30 p.m. Monday - Friday. Please note that voicemails left after 4:00 p.m. may not be returned until the following business day.  We are closed weekends and major holidays. You have access to a nurse at all times for urgent questions. Please call the main number to the clinic [phone] and follow the prompts.  For any non-urgent questions, you may also contact your provider using MyChart. We now offer e-Visits for anyone 64 and older to request care online for non-urgent symptoms. For details visit [email or web address].   Also download the MyChart app! Go to the app store, search "MyChart", open the app, select [place], and log in with your MyChart username and password.  Due to Covid, a mask is required upon entering the hospital/clinic. If you do not have a mask, one will be given to you upon arrival. For doctor visits, patients may have 1 support person aged 64 or older with them. For treatment visits, patients cannot have anyone with them due to current Covid guidelines and our immunocompromised population.  

## 2021-06-09 ENCOUNTER — Other Ambulatory Visit: Payer: Self-pay

## 2021-06-09 ENCOUNTER — Inpatient Hospital Stay: Payer: BC Managed Care – PPO

## 2021-06-09 VITALS — BP 112/73 | HR 78 | Resp 20

## 2021-06-09 DIAGNOSIS — Z5111 Encounter for antineoplastic chemotherapy: Secondary | ICD-10-CM | POA: Diagnosis not present

## 2021-06-09 DIAGNOSIS — D509 Iron deficiency anemia, unspecified: Secondary | ICD-10-CM

## 2021-06-09 MED ORDER — SODIUM CHLORIDE 0.9 % IV SOLN: 200.0000 mg | INTRAVENOUS | Status: DC

## 2021-06-09 MED ORDER — SODIUM CHLORIDE 0.9% FLUSH
10.0000 mL | Freq: Once | INTRAVENOUS | Status: AC | PRN
  Administered 2021-06-09: 10 mL
  Filled 2021-06-09: qty 10

## 2021-06-09 MED ORDER — HEPARIN SOD (PORK) LOCK FLUSH 100 UNIT/ML IV SOLN
500.0000 [IU] | Freq: Once | INTRAVENOUS | Status: AC | PRN
  Administered 2021-06-09: 500 [IU]
  Filled 2021-06-09: qty 5

## 2021-06-09 MED ORDER — SODIUM CHLORIDE 0.9 % IV SOLN
Freq: Once | INTRAVENOUS | Status: AC
  Filled 2021-06-09: qty 250

## 2021-06-09 MED ORDER — HEPARIN SOD (PORK) LOCK FLUSH 100 UNIT/ML IV SOLN
INTRAVENOUS | Status: AC
  Filled 2021-06-09: qty 5

## 2021-06-09 MED ORDER — IRON SUCROSE 20 MG/ML IV SOLN
200.0000 mg | Freq: Once | INTRAVENOUS | Status: AC
Start: 2021-06-09 — End: 2021-06-09
  Administered 2021-06-09: 200 mg via INTRAVENOUS
  Filled 2021-06-09: qty 10

## 2021-06-09 NOTE — Patient Instructions (Signed)

## 2021-06-11 ENCOUNTER — Inpatient Hospital Stay: Payer: BC Managed Care – PPO

## 2021-06-11 ENCOUNTER — Other Ambulatory Visit: Payer: Self-pay

## 2021-06-11 VITALS — BP 95/64 | HR 78 | Resp 20

## 2021-06-11 DIAGNOSIS — Z95828 Presence of other vascular implants and grafts: Secondary | ICD-10-CM

## 2021-06-11 DIAGNOSIS — D509 Iron deficiency anemia, unspecified: Secondary | ICD-10-CM

## 2021-06-11 DIAGNOSIS — Z5111 Encounter for antineoplastic chemotherapy: Secondary | ICD-10-CM | POA: Diagnosis not present

## 2021-06-11 MED ORDER — SODIUM CHLORIDE 0.9 % IV SOLN
Freq: Once | INTRAVENOUS | Status: AC
Start: 2021-06-11 — End: 2021-06-11
  Filled 2021-06-11: qty 250

## 2021-06-11 MED ORDER — SODIUM CHLORIDE 0.9 % IV SOLN: 200.0000 mg | INTRAVENOUS | Status: DC

## 2021-06-11 MED ORDER — HEPARIN SOD (PORK) LOCK FLUSH 100 UNIT/ML IV SOLN
INTRAVENOUS | Status: AC
  Filled 2021-06-11: qty 5

## 2021-06-11 MED ORDER — IRON SUCROSE 20 MG/ML IV SOLN
200.0000 mg | Freq: Once | INTRAVENOUS | Status: AC
  Administered 2021-06-11: 200 mg via INTRAVENOUS
  Filled 2021-06-11: qty 10

## 2021-06-11 MED ORDER — HEPARIN SOD (PORK) LOCK FLUSH 100 UNIT/ML IV SOLN
500.0000 [IU] | Freq: Once | INTRAVENOUS | Status: AC
Start: 2021-06-11 — End: 2021-06-11
  Administered 2021-06-11: 500 [IU] via INTRAVENOUS
  Filled 2021-06-11: qty 5

## 2021-06-11 NOTE — Addendum Note (Signed)
Addended by: Quita Skye on: 06/11/2021 03:03 PM   Modules accepted: Orders

## 2021-06-11 NOTE — Patient Instructions (Signed)
CANCER CENTER [place] REG[facility] MEDICAL ONCOLOGY  Discharge Instructions: Thank you for choosing [facility] to provide your oncology and hematology care.  If you have a lab appointment with the Cancer Center, please go directly to the Cancer Center and check in at the registration area.  Wear comfortable clothing and clothing appropriate for easy access to any Portacath or PICC line.   We strive to give you quality time with your provider. You may need to reschedule your appointment if you arrive late (15 or more minutes).  Arriving late affects you and other patients whose appointments are after yours.  Also, if you miss three or more appointments without notifying the office, you may be dismissed from the clinic at the provider's discretion.      For prescription refill requests, have your pharmacy contact our office and allow 72 hours for refills to be completed.    Today you received the following chemotherapy and/or immunotherapy agents VENOFER      To help prevent nausea and vomiting after your treatment, we encourage you to take your nausea medication as directed.  BELOW ARE SYMPTOMS THAT SHOULD BE REPORTED IMMEDIATELY: *FEVER GREATER THAN 100.4 F (38 C) OR HIGHER *CHILLS OR SWEATING *NAUSEA AND VOMITING THAT IS NOT CONTROLLED WITH YOUR NAUSEA MEDICATION *UNUSUAL SHORTNESS OF BREATH *UNUSUAL BRUISING OR BLEEDING *URINARY PROBLEMS (pain or burning when urinating, or frequent urination) *BOWEL PROBLEMS (unusual diarrhea, constipation, pain near the anus) TENDERNESS IN MOUTH AND THROAT WITH OR WITHOUT PRESENCE OF ULCERS (sore throat, sores in mouth, or a toothache) UNUSUAL RASH, SWELLING OR PAIN  UNUSUAL VAGINAL DISCHARGE OR ITCHING   Items with * indicate a potential emergency and should be followed up as soon as possible or go to the Emergency Department if any problems should occur.  Please show the CHEMOTHERAPY ALERT CARD or IMMUNOTHERAPY ALERT CARD at check-in to  the Emergency Department and triage nurse.  Should you have questions after your visit or need to cancel or reschedule your appointment, please contact CANCER CENTER [place] [facility] MEDICAL ONCOLOGY  [phone] and follow the prompts.  Office hours are 8:00 a.m. to 4:30 p.m. Monday - Friday. Please note that voicemails left after 4:00 p.m. may not be returned until the following business day.  We are closed weekends and major holidays. You have access to a nurse at all times for urgent questions. Please call the main number to the clinic [phone] and follow the prompts.  For any non-urgent questions, you may also contact your provider using MyChart. We now offer e-Visits for anyone 18 and older to request care online for non-urgent symptoms. For details visit [email or web address].   Also download the MyChart app! Go to the app store, search "MyChart", open the app, select [place], and log in with your MyChart username and password.  Due to Covid, a mask is required upon entering the hospital/clinic. If you do not have a mask, one will be given to you upon arrival. For doctor visits, patients may have 1 support person aged 18 or older with them. For treatment visits, patients cannot have anyone with them due to current Covid guidelines and our immunocompromised population.   Iron Sucrose injection What is this medication? IRON SUCROSE (AHY ern SOO krohs) is an iron complex. Iron is used to make healthy red blood cells, which carry oxygen and nutrients throughout the body. This medicine is used to treat iron deficiency anemia in people with chronickidney disease. This medicine may be used for other   purposes; ask your health care provider orpharmacist if you have questions. COMMON BRAND NAME(S): Venofer What should I tell my care team before I take this medication? They need to know if you have any of these conditions: anemia not caused by low iron levels heart disease high levels of  iron in the blood kidney disease liver disease an unusual or allergic reaction to iron, other medicines, foods, dyes, or preservatives pregnant or trying to get pregnant breast-feeding How should I use this medication? This medicine is for infusion into a vein. It is given by a health careprofessional in a hospital or clinic setting. Talk to your pediatrician regarding the use of this medicine in children. While this drug may be prescribed for children as young as 2 years for selectedconditions, precautions do apply. Overdosage: If you think you have taken too much of this medicine contact apoison control center or emergency room at once. NOTE: This medicine is only for you. Do not share this medicine with others. What if I miss a dose? It is important not to miss your dose. Call your doctor or health careprofessional if you are unable to keep an appointment. What may interact with this medication? Do not take this medicine with any of the following medications: deferoxamine dimercaprol other iron products This medicine may also interact with the following medications: chloramphenicol deferasirox This list may not describe all possible interactions. Give your health care provider a list of all the medicines, herbs, non-prescription drugs, or dietary supplements you use. Also tell them if you smoke, drink alcohol, or use illegaldrugs. Some items may interact with your medicine. What should I watch for while using this medication? Visit your doctor or healthcare professional regularly. Tell your doctor or healthcare professional if your symptoms do not start to get better or if theyget worse. You may need blood work done while you are taking this medicine. You may need to follow a special diet. Talk to your doctor. Foods that contain iron include: whole grains/cereals, dried fruits, beans, or peas, leafy greenvegetables, and organ meats (liver, kidney). What side effects may I notice from  receiving this medication? Side effects that you should report to your doctor or health care professionalas soon as possible: allergic reactions like skin rash, itching or hives, swelling of the face, lips, or tongue breathing problems changes in blood pressure cough fast, irregular heartbeat feeling faint or lightheaded, falls fever or chills flushing, sweating, or hot feelings joint or muscle aches/pains seizures swelling of the ankles or feet unusually weak or tired Side effects that usually do not require medical attention (report to yourdoctor or health care professional if they continue or are bothersome): diarrhea feeling achy headache irritation at site where injected nausea, vomiting stomach upset tiredness This list may not describe all possible side effects. Call your doctor for medical advice about side effects. You may report side effects to FDA at1-800-FDA-1088. Where should I keep my medication? This drug is given in a hospital or clinic and will not be stored at home. NOTE: This sheet is a summary. It may not cover all possible information. If you have questions about this medicine, talk to your doctor, pharmacist, orhealth care provider.  2022 Elsevier/Gold Standard (2011-08-27 17:14:35)  

## 2021-06-13 ENCOUNTER — Inpatient Hospital Stay: Payer: BC Managed Care – PPO

## 2021-06-13 ENCOUNTER — Other Ambulatory Visit: Payer: Self-pay

## 2021-06-13 VITALS — BP 98/66 | HR 78

## 2021-06-13 DIAGNOSIS — D509 Iron deficiency anemia, unspecified: Secondary | ICD-10-CM

## 2021-06-13 DIAGNOSIS — Z5111 Encounter for antineoplastic chemotherapy: Secondary | ICD-10-CM | POA: Diagnosis not present

## 2021-06-13 MED ORDER — IRON SUCROSE 20 MG/ML IV SOLN
200.0000 mg | Freq: Once | INTRAVENOUS | Status: AC
  Administered 2021-06-13: 200 mg via INTRAVENOUS
  Filled 2021-06-13: qty 10

## 2021-06-13 MED ORDER — HEPARIN SOD (PORK) LOCK FLUSH 100 UNIT/ML IV SOLN
500.0000 [IU] | Freq: Once | INTRAVENOUS | Status: AC | PRN
  Administered 2021-06-13: 500 [IU]
  Filled 2021-06-13: qty 5

## 2021-06-13 MED ORDER — SODIUM CHLORIDE 0.9 % IV SOLN
Freq: Once | INTRAVENOUS | Status: AC
  Filled 2021-06-13: qty 250

## 2021-06-13 MED ORDER — SODIUM CHLORIDE 0.9 % IV SOLN: 200.0000 mg | INTRAVENOUS | Status: DC

## 2021-06-13 NOTE — Patient Instructions (Signed)
CANCER CENTER [place] REGION[facility] MEDICAL ONCOLOGY  Discharge Instructions: Thank you for choosing [facility] to provide your oncology and hematology care.  If you have a lab appointment with the Cancer Center, please go directly to the Cancer Center and check in at the registration area.  Wear comfortable clothing and clothing appropriate for easy access to any Portacath or PICC line.   We strive to give you quality time with your provider. You may need to reschedule your appointment if you arrive late (15 or more minutes).  Arriving late affects you and other patients whose appointments are after yours.  Also, if you miss three or more appointments without notifying the office, you may be dismissed from the clinic at the provider's discretion.      For prescription refill requests, have your pharmacy contact our office and allow 72 hours for refills to be completed.    Today you received the following : Venofer   To help prevent nausea and vomiting after your treatment, we encourage you to take your nausea medication as directed.  BELOW ARE SYMPTOMS THAT SHOULD BE REPORTED IMMEDIATELY: . *FEVER GREATER THAN 100.4 F (38 C) OR HIGHER . *CHILLS OR SWEATING . *NAUSEA AND VOMITING THAT IS NOT CONTROLLED WITH YOUR NAUSEA MEDICATION . *UNUSUAL SHORTNESS OF BREATH . *UNUSUAL BRUISING OR BLEEDING . *URINARY PROBLEMS (pain or burning when urinating, or frequent urination) . *BOWEL PROBLEMS (unusual diarrhea, constipation, pain near the anus) . TENDERNESS IN MOUTH AND THROAT WITH OR WITHOUT PRESENCE OF ULCERS (sore throat, sores in mouth, or a toothache) . UNUSUAL RASH, SWELLING OR PAIN  . UNUSUAL VAGINAL DISCHARGE OR ITCHING   Items with * indicate a potential emergency and should be followed up as soon as possible or go to the Emergency Department if any problems should occur.  Please show the CHEMOTHERAPY ALERT CARD or IMMUNOTHERAPY ALERT CARD at check-in to the Emergency  Department and triage nurse.  Should you have questions after your visit or need to cancel or reschedule your appointment, please contact CANCER CENTER  [facility]IONAL MEDICAL ONCOLOGY  [phone] and follow the prompts.  Office hours are 8:00 a.m. to 4:30 p.m. Monday - Friday. Please note that voicemails left after 4:00 p.m. may not be returned until the following business day.  We are closed weekends and major holidays. You have access to a nurse at all times for urgent questions. Please call the main number to the clinic [phone] and follow the prompts.  For any non-urgent questions, you may also contact your provider using MyChart. We now offer e-Visits for anyone 18 and older to request care online for non-urgent symptoms. For details visit [email or web address].   Also download the MyChart app! Go to the app store, search "MyChart", open the app, select [place], and log in with your MyChart username and password.  Due to Covid, a mask is required upon entering the hospital/clinic. If you do not have a mask, one will be given to you upon arrival. For doctor visits, patients may have 1 support person aged 18 or older with them. For treatment visits, patients cannot have anyone with them due to current Covid guidelines and our immunocompromised population.  

## 2021-06-16 ENCOUNTER — Inpatient Hospital Stay: Payer: BC Managed Care – PPO

## 2021-06-16 ENCOUNTER — Inpatient Hospital Stay (HOSPITAL_BASED_OUTPATIENT_CLINIC_OR_DEPARTMENT_OTHER): Payer: BC Managed Care – PPO | Admitting: Oncology

## 2021-06-16 ENCOUNTER — Encounter: Payer: Self-pay | Admitting: Oncology

## 2021-06-16 ENCOUNTER — Other Ambulatory Visit: Payer: Self-pay

## 2021-06-16 ENCOUNTER — Ambulatory Visit: Payer: BC Managed Care – PPO

## 2021-06-16 VITALS — BP 110/70 | HR 88 | Temp 96.9°F | Resp 20 | Wt 132.9 lb

## 2021-06-16 DIAGNOSIS — E538 Deficiency of other specified B group vitamins: Secondary | ICD-10-CM

## 2021-06-16 DIAGNOSIS — C189 Malignant neoplasm of colon, unspecified: Secondary | ICD-10-CM

## 2021-06-16 DIAGNOSIS — Z5111 Encounter for antineoplastic chemotherapy: Secondary | ICD-10-CM

## 2021-06-16 DIAGNOSIS — R945 Abnormal results of liver function studies: Secondary | ICD-10-CM

## 2021-06-16 DIAGNOSIS — R7989 Other specified abnormal findings of blood chemistry: Secondary | ICD-10-CM

## 2021-06-16 DIAGNOSIS — D509 Iron deficiency anemia, unspecified: Secondary | ICD-10-CM

## 2021-06-16 LAB — COMPREHENSIVE METABOLIC PANEL
ALT: 187 U/L — ABNORMAL HIGH (ref 0–44)
AST: 246 U/L — ABNORMAL HIGH (ref 15–41)
Albumin: 3.1 g/dL — ABNORMAL LOW (ref 3.5–5.0)
Alkaline Phosphatase: 210 U/L — ABNORMAL HIGH (ref 38–126)
Anion gap: 8 (ref 5–15)
BUN: 17 mg/dL (ref 8–23)
CO2: 25 mmol/L (ref 22–32)
Calcium: 9.1 mg/dL (ref 8.9–10.3)
Chloride: 104 mmol/L (ref 98–111)
Creatinine, Ser: 0.81 mg/dL (ref 0.44–1.00)
GFR, Estimated: >60 mL/min (ref >60)
Glucose, Bld: 109 mg/dL — ABNORMAL HIGH (ref 70–99)
Potassium: 3.7 mmol/L (ref 3.5–5.1)
Sodium: 137 mmol/L (ref 135–145)
Total Bilirubin: 0.4 mg/dL (ref 0.3–1.2)
Total Protein: 7.4 g/dL (ref 6.5–8.1)

## 2021-06-16 LAB — CBC WITH DIFFERENTIAL/PLATELET
Abs Immature Granulocytes: 0.02 10*3/uL (ref 0.00–0.07)
Basophils Absolute: 0 10*3/uL (ref 0.0–0.1)
Basophils Relative: 1 %
Eosinophils Absolute: 0.2 10*3/uL (ref 0.0–0.5)
Eosinophils Relative: 3 %
HCT: 32.6 % — ABNORMAL LOW (ref 36.0–46.0)
Hemoglobin: 10 g/dL — ABNORMAL LOW (ref 12.0–15.0)
Immature Granulocytes: 0 %
Lymphocytes Relative: 12 %
Lymphs Abs: 0.8 10*3/uL (ref 0.7–4.0)
MCH: 24.8 pg — ABNORMAL LOW (ref 26.0–34.0)
MCHC: 30.7 g/dL (ref 30.0–36.0)
MCV: 80.9 fL (ref 80.0–100.0)
Monocytes Absolute: 0.4 10*3/uL (ref 0.1–1.0)
Monocytes Relative: 6 %
Neutro Abs: 5.3 10*3/uL (ref 1.7–7.7)
Neutrophils Relative %: 78 %
Platelets: 177 10*3/uL (ref 150–400)
RBC: 4.03 MIL/uL (ref 3.87–5.11)
RDW: 23.4 % — ABNORMAL HIGH (ref 11.5–15.5)
WBC: 6.8 10*3/uL (ref 4.0–10.5)
nRBC: 0 % (ref 0.0–0.2)

## 2021-06-16 LAB — HEPATITIS B SURFACE ANTIGEN: Hepatitis B Surface Ag: NONREACTIVE

## 2021-06-16 MED ORDER — CYANOCOBALAMIN 1000 MCG/ML IJ SOLN
1000.0000 ug | INTRAMUSCULAR | Status: DC
  Administered 2021-06-16: 1000 ug via INTRAMUSCULAR
  Filled 2021-06-16: qty 1

## 2021-06-16 MED ORDER — DEXTROSE 5 % IV SOLN
Freq: Once | INTRAVENOUS | Status: AC
Start: 2021-06-16 — End: 2021-06-16
  Filled 2021-06-16: qty 250

## 2021-06-16 MED ORDER — OXALIPLATIN CHEMO INJECTION 100 MG/20ML
88.0000 mg/m2 | Freq: Once | INTRAVENOUS | Status: AC
  Administered 2021-06-16: 150 mg via INTRAVENOUS
  Filled 2021-06-16: qty 20

## 2021-06-16 MED ORDER — HEPARIN SOD (PORK) LOCK FLUSH 100 UNIT/ML IV SOLN
500.0000 [IU] | Freq: Once | INTRAVENOUS | Status: DC | PRN
  Filled 2021-06-16: qty 5

## 2021-06-16 MED ORDER — SODIUM CHLORIDE 0.9 % IV SOLN
10.0000 mg | Freq: Once | INTRAVENOUS | Status: AC
  Administered 2021-06-16: 10 mg via INTRAVENOUS
  Filled 2021-06-16: qty 10

## 2021-06-16 MED ORDER — PALONOSETRON HCL INJECTION 0.25 MG/5ML
0.2500 mg | Freq: Once | INTRAVENOUS | Status: AC
  Administered 2021-06-16: 0.25 mg via INTRAVENOUS

## 2021-06-16 MED ORDER — LEUCOVORIN CALCIUM INJECTION 350 MG
409.0000 mg/m2 | Freq: Once | INTRAVENOUS | Status: AC
  Administered 2021-06-16: 700 mg via INTRAVENOUS
  Filled 2021-06-16: qty 25

## 2021-06-16 MED ORDER — DEXAMETHASONE SODIUM PHOSPHATE 10 MG/ML IJ SOLN
INTRAMUSCULAR | Status: AC
  Filled 2021-06-16: qty 1

## 2021-06-16 MED ORDER — SODIUM CHLORIDE 0.9 % IV SOLN
2400.0000 mg/m2 | INTRAVENOUS | Status: DC
  Administered 2021-06-16: 4100 mg via INTRAVENOUS
  Filled 2021-06-16: qty 82

## 2021-06-16 MED ORDER — PALONOSETRON HCL INJECTION 0.25 MG/5ML
INTRAVENOUS | Status: AC
  Filled 2021-06-16: qty 5

## 2021-06-16 MED ORDER — FLUOROURACIL CHEMO INJECTION 2.5 GM/50ML
400.0000 mg/m2 | Freq: Once | INTRAVENOUS | Status: AC
  Administered 2021-06-16: 700 mg via INTRAVENOUS
  Filled 2021-06-16: qty 14

## 2021-06-16 NOTE — Progress Notes (Signed)
Spoke with MD about patient's AST and ALT elevated  levels prior to starting Chemo and per RAO it is ok to begin treatment.

## 2021-06-16 NOTE — Progress Notes (Signed)
ALT/AST above parameters.  MD ok to proceed

## 2021-06-16 NOTE — Progress Notes (Signed)
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  She received IV Venofer for that.  I was concerned about malignancy given the degree of anemia and therefore obtain CT abdomen and pelvis with contrast CT showed 6.4 cm enhancing mass in the right liver and another mass measuring 4 cm.  Large volume stool in the right colon transverse and descending colon with irregular mass lesion in the distal sigmoid colon extending into the rectosigmoid junction with lateral extracolonic extension into the pelvic sidewall.  Apparent luminal narrowing at the level of the lesion which accounts for large volume of stool proximally.  Multiple pericolonic and perirectal lymph nodes.  Multiple peritoneal nodules are seen in the lower right pelvic sidewall.  Colon mass appears to be invading posterior uterus.Thrombosis of the superior rectal vein may be bland thrombus although direct tumor invasion not excluded.   Patient had a flexible sigmoidoscopy which showed a large obstructing mass in the rectosigmoid colon.  Scope could not be traversed beyond the obstruction.  Biopsies were taken and were consistent with adenocarcinoma. Patient had a diverting colostomy on 05/01/2021 with Dr. Dahlia Byes   Patient started palliative FOLFOX chemotherapy in June 2022 with plans to add Avastin after 6 cycles of treatment  Interval history-patient is tolerating chemotherapy well without any  significant side effects.  Appetite and weight have remained stable.  Denies any significant pain.  Colostomy is functioning well  ECOG PS- 1 Pain scale- 0  Review of systems- Review of Systems  Constitutional:  Positive for malaise/fatigue. Negative for chills, fever and weight loss.  HENT:  Negative for congestion, ear discharge and nosebleeds.   Eyes:  Negative for blurred vision.  Respiratory:  Negative for cough, hemoptysis, sputum production, shortness of breath and wheezing.   Cardiovascular:  Negative for chest pain, palpitations, orthopnea and claudication.  Gastrointestinal:  Negative for abdominal pain, blood in stool, constipation, diarrhea, heartburn, melena, nausea and vomiting.  Genitourinary:  Negative for dysuria, flank pain, frequency, hematuria and urgency.  Musculoskeletal:  Negative for back pain, joint pain and myalgias.  Skin:  Negative for rash.  Neurological:  Negative for dizziness, tingling, focal weakness, seizures, weakness and headaches.  Endo/Heme/Allergies:  Does not bruise/bleed easily.  Psychiatric/Behavioral:  Negative for depression and suicidal ideas. The patient does not have insomnia.      No Known Allergies   Past Medical History:  Diagnosis Date   Allergy    Anemia      Past Surgical History:  Procedure Laterality Date   FLEXIBLE SIGMOIDOSCOPY N/A 04/24/2021   Procedure: FLEXIBLE SIGMOIDOSCOPY;  Surgeon: [name], MD;  Location: [facility] ENDOSCOPY;  Service: Gastroenterology;  Laterality: N/A;   PORTACATH PLACEMENT N/A 05/01/2021   Procedure: INSERTION PORT-A-CATH;  Surgeon: Jules Husbands, MD;  Location: [facility] ORS;  Service: General;  Laterality: N/A;    Social History   Socioeconomic History   Marital status: Married    Spouse name: Not on file   Number of children: Not on file  Years of education: Not on file   Highest education level: Not on file  Occupational History   Not on file  Tobacco Use   Smoking status: Never    Smokeless tobacco: Never  Vaping Use   Vaping Use: Never used  Substance and Sexual Activity   Alcohol use: Not Currently   Drug use: Never   Sexual activity: Not Currently  Other Topics Concern   Not on file  Social History Narrative   Not on file   Social Determinants of Health   Financial Resource Strain: Not on file  Food Insecurity: Not on file  Transportation Needs: Not on file  Physical Activity: Not on file  Stress: Not on file  Social Connections: Not on file  Intimate Partner Violence: Not on file    Family History  Problem Relation Age of Onset   Stroke Mother    Hypertension Mother    Cancer Father      Current Outpatient Medications:    dexamethasone (DECADRON) 4 MG tablet, Take 2 tablets (8 mg total) by mouth daily. Start the day after chemotherapy for 2 days. Take with food., Disp: 30 tablet, Rfl: 1   folic acid (FOLVITE) 1 MG tablet, Take 1 tablet (1 mg total) by mouth daily., Disp: 30 tablet, Rfl: 3   ibuprofen (ADVIL) 600 MG tablet, Take 1 tablet (600 mg total) by mouth every 6 (six) hours as needed., Disp: 30 tablet, Rfl: 0   lidocaine-prilocaine (EMLA) cream, Apply to affected area once, Disp: 30 g, Rfl: 3   LORazepam (ATIVAN) 0.5 MG tablet, Take 1 tablet (0.5 mg total) by mouth every 6 (six) hours as needed (Nausea or vomiting). (Patient not taking: No sig reported), Disp: 30 tablet, Rfl: 0   ondansetron (ZOFRAN) 8 MG tablet, Take 1 tablet (8 mg total) by mouth 2 (two) times daily as needed for refractory nausea / vomiting. Start on day 3 after chemotherapy. (Patient not taking: No sig reported), Disp: 30 tablet, Rfl: 1   prochlorperazine (COMPAZINE) 10 MG tablet, Take 1 tablet (10 mg total) by mouth every 6 (six) hours as needed (Nausea or vomiting). (Patient not taking: No sig reported), Disp: 30 tablet, Rfl: 1 No current facility-administered medications for this visit.  Facility-Administered Medications Ordered in Other Visits:    cyanocobalamin  ((VITAMIN B-12)) injection 1,000 mcg, 1,000 mcg, Intramuscular, Q30 days, Sindy Guadeloupe, MD, 1,000 mcg at 06/16/21 1212   fluorouracil (ADRUCIL) 4,100 mg in sodium chloride 0.9 % 68 mL chemo infusion, 2,400 mg/m2 (Treatment Plan Recorded), Intravenous, 1 day or 1 dose, Sindy Guadeloupe, MD   fluorouracil (ADRUCIL) chemo injection 700 mg, 400 mg/m2 (Treatment Plan Recorded), Intravenous, Once, Sindy Guadeloupe, MD   heparin lock flush 100 unit/mL, 500 Units, Intracatheter, Once PRN, Sindy Guadeloupe, MD   leucovorin 700 mg in dextrose 5 % 250 mL infusion, 409 mg/m2 (Treatment Plan Recorded), Intravenous, Once, Sindy Guadeloupe, MD, Last Rate: 143 mL/hr at 06/16/21 1048, 700 mg at 06/16/21 1048   oxaliplatin (ELOXATIN) 150 mg in dextrose 5 % 500 mL chemo infusion, 88 mg/m2 (Treatment Plan Recorded), Intravenous, Once, Sindy Guadeloupe, MD, Last Rate: 265 mL/hr at 06/16/21 1044, 150 mg at 06/16/21 1044  Physical exam:  Vitals:   06/16/21 0900  BP: 110/70  Pulse: 88  Resp: 20  Temp: (!) 96.9 F (36.1 C)  TempSrc: Tympanic  SpO2: 100%  Weight: 132 lb 14.4 oz (60.3 kg)   Physical Exam Constitutional:  General: She is not in acute distress. Cardiovascular:     Rate and Rhythm: Normal rate and regular rhythm.     Heart sounds: Normal heart sounds.  Pulmonary:     Effort: Pulmonary effort is normal.     Breath sounds: Normal breath sounds.  Abdominal:     General: Bowel sounds are normal.     Palpations: Abdomen is soft.     Comments: Colostomy in place  Skin:    General: Skin is warm and dry.  Neurological:     Mental Status: She is alert and oriented to person, place, and time.     CMP Latest Ref Rng & Units 06/16/2021  Glucose 70 - 99 mg/dL 109(H)  BUN 8 - 23 mg/dL 17  Creatinine 0.44 - 1.00 mg/dL 0.81  Sodium 135 - 145 mmol/L 137  Potassium 3.5 - 5.1 mmol/L 3.7  Chloride 98 - 111 mmol/L 104  CO2 22 - 32 mmol/L 25  Calcium 8.9 - 10.3 mg/dL 9.1  Total Protein 6.5 - 8.1 g/dL 7.4   Total Bilirubin 0.3 - 1.2 mg/dL 0.4  Alkaline Phos 38 - 126 U/L 210(H)  AST 15 - 41 U/L 246(H)  ALT 0 - 44 U/L 187(H)   CBC Latest Ref Rng & Units 06/16/2021  WBC 4.0 - 10.5 K/uL 6.8  Hemoglobin 12.0 - 15.0 g/dL 10.0(L)  Hematocrit 36.0 - 46.0 % 32.6(L)  Platelets 150 - 400 K/uL 177     Assessment and plan- Patient is a 65 y.o. 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  She is here for on treatment assessment prior to cycle 3 of palliative FOLFOX chemotherapy  Counts okay to proceed with cycle 3 of palliative FOLFOX chemotherapy today with pump DC on day 3.  I will see her back in 2 weeks for cycle 4.  Abnormal LFTs: Bilirubin is normal.  Possibly secondary to chemotherapy.  Continue to monitor  Microcytic anemia: Likely a component of iron deficiency.  Hemoglobin is improved to 10 from 8 point 4:08 doses of Venofer and she will receive her fifth dose today.  Her B12 levels were also low at 188 and she will start receiving monthly B12 shots starting today.  We will plan to repeat CT chest abdomen and pelvis with contrast after 6 cycles and if scans continue to show response to treatment I will add a Avastin with cycle 7.    Visit Diagnosis 1. Encounter for antineoplastic chemotherapy   2. Metastatic colon cancer in female [facility])   3. Microcytic anemia   4. B12 deficiency      Dr. Randa Evens, MD, MPH [facility] [phone] 06/16/2021 12:38 PM

## 2021-06-17 LAB — CEA: CEA: 725 ng/mL — ABNORMAL HIGH (ref 0.0–4.7)

## 2021-06-18 ENCOUNTER — Inpatient Hospital Stay: Payer: BC Managed Care – PPO

## 2021-06-18 VITALS — BP 100/65 | HR 86 | Resp 19

## 2021-06-18 DIAGNOSIS — C189 Malignant neoplasm of colon, unspecified: Secondary | ICD-10-CM

## 2021-06-18 DIAGNOSIS — Z5111 Encounter for antineoplastic chemotherapy: Secondary | ICD-10-CM | POA: Diagnosis not present

## 2021-06-18 MED ORDER — HEPARIN SOD (PORK) LOCK FLUSH 100 UNIT/ML IV SOLN
500.0000 [IU] | Freq: Once | INTRAVENOUS | Status: AC | PRN
  Administered 2021-06-18: 500 [IU]
  Filled 2021-06-18: qty 5

## 2021-06-18 MED ORDER — SODIUM CHLORIDE 0.9% FLUSH
10.0000 mL | INTRAVENOUS | Status: DC | PRN
  Administered 2021-06-18: 10 mL
  Filled 2021-06-18: qty 10

## 2021-06-18 NOTE — Patient Instructions (Signed)
CANCER CENTER [place] [facility] MEDICAL ONCOLOGY  Discharge Instructions: Thank you for choosing [facility] to provide your oncology and hematology care.  If you have a lab appointment with the Cancer Center, please go directly to the Cancer Center and check in at the registration area.  Wear comfortable clothing and clothing appropriate for easy access to any Portacath or PICC line.   We strive to give you quality time with your provider. You may need to reschedule your appointment if you arrive late (15 or more minutes).  Arriving late affects you and other patients whose appointments are after yours.  Also, if you miss three or more appointments without notifying the office, you may be dismissed from the clinic at the provider's discretion.      For prescription refill requests, have your pharmacy contact our office and allow 72 hours for refills to be completed.      To help prevent nausea and vomiting after your treatment, we encourage you to take your nausea medication as directed.  BELOW ARE SYMPTOMS THAT SHOULD BE REPORTED IMMEDIATELY: *FEVER GREATER THAN 100.4 F (38 C) OR HIGHER *CHILLS OR SWEATING *NAUSEA AND VOMITING THAT IS NOT CONTROLLED WITH YOUR NAUSEA MEDICATION *UNUSUAL SHORTNESS OF BREATH *UNUSUAL BRUISING OR BLEEDING *URINARY PROBLEMS (pain or burning when urinating, or frequent urination) *BOWEL PROBLEMS (unusual diarrhea, constipation, pain near the anus) TENDERNESS IN MOUTH AND THROAT WITH OR WITHOUT PRESENCE OF ULCERS (sore throat, sores in mouth, or a toothache) UNUSUAL RASH, SWELLING OR PAIN  UNUSUAL VAGINAL DISCHARGE OR ITCHING   Items with * indicate a potential emergency and should be followed up as soon as possible or go to the Emergency Department if any problems should occur.  Please show the CHEMOTHERAPY ALERT CARD or IMMUNOTHERAPY ALERT CARD at check-in to the Emergency Department and triage nurse.  Should you have questions after your  visit or need to cancel or reschedule your appointment, please contact CANCER CENTER  [facility]AL [facility]ICAL ONCOLOGY  [phone] and follow the prompts.  Office hours are 8:00 a.m. to 4:30 p.m. Monday - Friday. Please note that voicemails left after 4:00 p.m. may not be returned until the following business day.  We are closed weekends and major holidays. You have access to a nurse at all times for urgent questions. Please call the main number to the clinic [phone] and follow the prompts.  For any non-urgent questions, you may also contact your provider using MyChart. We now offer e-Visits for anyone 18 and older to request care online for non-urgent symptoms. For details visit [email or web address].   Also download the MyChart app! Go to the app store, search "MyChart", open the app, select [place], and log in with your MyChart username and password.  Due to Covid, a mask is required upon entering the hospital/clinic. If you do not have a mask, one will be given to you upon arrival. For doctor visits, patients may have 1 support person aged 18 or older with them. For treatment visits, patients cannot have anyone with them due to current Covid guidelines and our immunocompromised population.  

## 2021-06-30 ENCOUNTER — Inpatient Hospital Stay (HOSPITAL_BASED_OUTPATIENT_CLINIC_OR_DEPARTMENT_OTHER): Payer: BC Managed Care – PPO | Admitting: Oncology

## 2021-06-30 ENCOUNTER — Inpatient Hospital Stay: Payer: BC Managed Care – PPO

## 2021-06-30 ENCOUNTER — Other Ambulatory Visit: Payer: Self-pay

## 2021-06-30 ENCOUNTER — Inpatient Hospital Stay: Payer: BC Managed Care – PPO | Attending: Oncology

## 2021-06-30 ENCOUNTER — Encounter: Payer: Self-pay | Admitting: Oncology

## 2021-06-30 VITALS — Temp 97.3°F

## 2021-06-30 VITALS — BP 105/83 | HR 80 | Temp 80.0°F | Resp 20 | Wt 130.3 lb

## 2021-06-30 DIAGNOSIS — C787 Secondary malignant neoplasm of liver and intrahepatic bile duct: Secondary | ICD-10-CM | POA: Diagnosis not present

## 2021-06-30 DIAGNOSIS — C20 Malignant neoplasm of rectum: Secondary | ICD-10-CM | POA: Diagnosis present

## 2021-06-30 DIAGNOSIS — C187 Malignant neoplasm of sigmoid colon: Secondary | ICD-10-CM | POA: Diagnosis present

## 2021-06-30 DIAGNOSIS — C189 Malignant neoplasm of colon, unspecified: Secondary | ICD-10-CM

## 2021-06-30 DIAGNOSIS — R945 Abnormal results of liver function studies: Secondary | ICD-10-CM | POA: Diagnosis not present

## 2021-06-30 DIAGNOSIS — Z79899 Other long term (current) drug therapy: Secondary | ICD-10-CM | POA: Insufficient documentation

## 2021-06-30 DIAGNOSIS — C786 Secondary malignant neoplasm of retroperitoneum and peritoneum: Secondary | ICD-10-CM | POA: Insufficient documentation

## 2021-06-30 DIAGNOSIS — R7989 Other specified abnormal findings of blood chemistry: Secondary | ICD-10-CM

## 2021-06-30 DIAGNOSIS — Z452 Encounter for adjustment and management of vascular access device: Secondary | ICD-10-CM | POA: Diagnosis not present

## 2021-06-30 DIAGNOSIS — D509 Iron deficiency anemia, unspecified: Secondary | ICD-10-CM

## 2021-06-30 DIAGNOSIS — Z5111 Encounter for antineoplastic chemotherapy: Secondary | ICD-10-CM | POA: Diagnosis present

## 2021-06-30 LAB — COMPREHENSIVE METABOLIC PANEL
ALT: 251 U/L — ABNORMAL HIGH (ref 0–44)
AST: 441 U/L — ABNORMAL HIGH (ref 15–41)
Albumin: 2.8 g/dL — ABNORMAL LOW (ref 3.5–5.0)
Alkaline Phosphatase: 188 U/L — ABNORMAL HIGH (ref 38–126)
Anion gap: 7 (ref 5–15)
BUN: 9 mg/dL (ref 8–23)
CO2: 24 mmol/L (ref 22–32)
Calcium: 8.9 mg/dL (ref 8.9–10.3)
Chloride: 105 mmol/L (ref 98–111)
Creatinine, Ser: 0.61 mg/dL (ref 0.44–1.00)
GFR, Estimated: >60 mL/min (ref >60)
Glucose, Bld: 79 mg/dL (ref 70–99)
Potassium: 3.9 mmol/L (ref 3.5–5.1)
Sodium: 136 mmol/L (ref 135–145)
Total Bilirubin: 0.4 mg/dL (ref 0.3–1.2)
Total Protein: 6.7 g/dL (ref 6.5–8.1)

## 2021-06-30 LAB — CBC WITH DIFFERENTIAL/PLATELET
Abs Immature Granulocytes: 0.01 10*3/uL (ref 0.00–0.07)
Basophils Absolute: 0 10*3/uL (ref 0.0–0.1)
Basophils Relative: 1 %
Eosinophils Absolute: 0.1 10*3/uL (ref 0.0–0.5)
Eosinophils Relative: 1 %
HCT: 31.7 % — ABNORMAL LOW (ref 36.0–46.0)
Hemoglobin: 10.1 g/dL — ABNORMAL LOW (ref 12.0–15.0)
Immature Granulocytes: 0 %
Lymphocytes Relative: 19 %
Lymphs Abs: 0.9 10*3/uL (ref 0.7–4.0)
MCH: 26.2 pg (ref 26.0–34.0)
MCHC: 31.9 g/dL (ref 30.0–36.0)
MCV: 82.1 fL (ref 80.0–100.0)
Monocytes Absolute: 0.8 10*3/uL (ref 0.1–1.0)
Monocytes Relative: 18 %
Neutro Abs: 2.7 10*3/uL (ref 1.7–7.7)
Neutrophils Relative %: 61 %
Platelets: 138 10*3/uL — ABNORMAL LOW (ref 150–400)
RBC: 3.86 MIL/uL — ABNORMAL LOW (ref 3.87–5.11)
RDW: 22.7 % — ABNORMAL HIGH (ref 11.5–15.5)
WBC: 4.5 10*3/uL (ref 4.0–10.5)
nRBC: 0 % (ref 0.0–0.2)

## 2021-06-30 MED ORDER — OXALIPLATIN CHEMO INJECTION 100 MG/20ML
150.0000 mg | Freq: Once | INTRAVENOUS | Status: AC
  Administered 2021-06-30: 150 mg via INTRAVENOUS
  Filled 2021-06-30: qty 30

## 2021-06-30 MED ORDER — IRON SUCROSE 20 MG/ML IV SOLN
200.0000 mg | Freq: Once | INTRAVENOUS | Status: AC
  Administered 2021-06-30: 200 mg via INTRAVENOUS
  Filled 2021-06-30: qty 10

## 2021-06-30 MED ORDER — SODIUM CHLORIDE 0.9 % IV SOLN
Freq: Once | INTRAVENOUS | Status: AC
  Filled 2021-06-30: qty 250

## 2021-06-30 MED ORDER — LEUCOVORIN CALCIUM INJECTION 350 MG
700.0000 mg | Freq: Once | INTRAVENOUS | Status: AC
  Administered 2021-06-30: 700 mg via INTRAVENOUS
  Filled 2021-06-30: qty 35

## 2021-06-30 MED ORDER — SODIUM CHLORIDE 0.9 % IV SOLN
10.0000 mg | Freq: Once | INTRAVENOUS | Status: AC
  Administered 2021-06-30: 10 mg via INTRAVENOUS
  Filled 2021-06-30: qty 10

## 2021-06-30 MED ORDER — SODIUM CHLORIDE 0.9 % IV SOLN: 200.0000 mg | INTRAVENOUS | Status: DC

## 2021-06-30 MED ORDER — DEXTROSE 5 % IV SOLN
Freq: Once | INTRAVENOUS | Status: AC
  Filled 2021-06-30: qty 250

## 2021-06-30 MED ORDER — FLUOROURACIL CHEMO INJECTION 2.5 GM/50ML
400.0000 mg/m2 | Freq: Once | INTRAVENOUS | Status: AC
  Administered 2021-06-30: 700 mg via INTRAVENOUS
  Filled 2021-06-30: qty 14

## 2021-06-30 MED ORDER — SODIUM CHLORIDE 0.9% FLUSH
10.0000 mL | Freq: Once | INTRAVENOUS | Status: AC
  Administered 2021-06-30: 10 mL via INTRAVENOUS
  Filled 2021-06-30: qty 10

## 2021-06-30 MED ORDER — PALONOSETRON HCL INJECTION 0.25 MG/5ML
0.2500 mg | Freq: Once | INTRAVENOUS | Status: AC
  Administered 2021-06-30: 0.25 mg via INTRAVENOUS
  Filled 2021-06-30: qty 5

## 2021-06-30 MED ORDER — SODIUM CHLORIDE 0.9 % IV SOLN
2400.0000 mg/m2 | INTRAVENOUS | Status: DC
  Administered 2021-06-30: 4100 mg via INTRAVENOUS
  Filled 2021-06-30: qty 82

## 2021-06-30 MED ORDER — HEPARIN SOD (PORK) LOCK FLUSH 100 UNIT/ML IV SOLN
500.0000 [IU] | Freq: Once | INTRAVENOUS | Status: DC
  Filled 2021-06-30: qty 5

## 2021-06-30 NOTE — Progress Notes (Addendum)
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  She received IV Venofer for that.  I was concerned about malignancy given the degree of anemia and therefore obtain CT abdomen and pelvis with contrast CT showed 6.4 cm enhancing mass in the right liver and another mass measuring 4 cm.  Large volume stool in the right colon transverse and descending colon with irregular mass lesion in the distal sigmoid colon extending into the rectosigmoid junction with lateral extracolonic extension into the pelvic sidewall.  Apparent luminal narrowing at the level of the lesion which accounts for large volume of stool proximally.  Multiple pericolonic and perirectal lymph nodes.  Multiple peritoneal nodules are seen in the lower right pelvic sidewall.  Colon mass appears to be invading posterior uterus.Thrombosis of the superior rectal vein may be bland thrombus although direct tumor invasion not excluded.   Patient had a flexible sigmoidoscopy which showed a large obstructing mass in the rectosigmoid colon.  Scope could not be traversed beyond the obstruction.  Biopsies were taken and were consistent with adenocarcinoma. Patient had a diverting colostomy on 05/01/2021 with Dr. Dahlia Byes   Patient started palliative FOLFOX chemotherapy in June 2022 with plans to add Avastin after 6 cycles of treatment    Interval history-patient reports doing well and denies any  specific complaints at this time.  Appetite and weight have remained stable.  Colostomy is functioning well.  Denies any significant nausea or vomiting  ECOG PS- 1 Pain scale- 0 Opioid associated constipation- no  Review of systems- Review of Systems  Constitutional:  Positive for malaise/fatigue. Negative for chills, fever and weight loss.  HENT:  Negative for congestion, ear discharge and nosebleeds.   Eyes:  Negative for blurred vision.  Respiratory:  Negative for cough, hemoptysis, sputum production, shortness of breath and wheezing.   Cardiovascular:  Negative for chest pain, palpitations, orthopnea and claudication.  Gastrointestinal:  Negative for abdominal pain, blood in stool, constipation, diarrhea, heartburn, melena, nausea and vomiting.  Genitourinary:  Negative for dysuria, flank pain, frequency, hematuria and urgency.  Musculoskeletal:  Negative for back pain, joint pain and myalgias.  Skin:  Negative for rash.  Neurological:  Negative for dizziness, tingling, focal weakness, seizures, weakness and headaches.  Endo/Heme/Allergies:  Does not bruise/bleed easily.  Psychiatric/Behavioral:  Negative for depression and suicidal ideas. The patient does not have insomnia.    No Known Allergies   Past Medical History:  Diagnosis Date   Allergy    Anemia      Past Surgical History:  Procedure Laterality Date   FLEXIBLE SIGMOIDOSCOPY N/A 04/24/2021   Procedure: FLEXIBLE SIGMOIDOSCOPY;  Surgeon: [name], MD;  Location: [facility] ENDOSCOPY;  Service: Gastroenterology;  Laterality: N/A;   PORTACATH PLACEMENT N/A 05/01/2021   Procedure: INSERTION PORT-A-CATH;  Surgeon: Jules Husbands, MD;  Location: [facility] ORS;  Service: General;  Laterality: N/A;    Social History   Socioeconomic History   Marital status: Married    Spouse name: Not on file  Number of children: Not on file   Years of education: Not on file   Highest education level: Not on file  Occupational History   Not  on file  Tobacco Use   Smoking status: Never   Smokeless tobacco: Never  Vaping Use   Vaping Use: Never used  Substance and Sexual Activity   Alcohol use: Not Currently   Drug use: Never   Sexual activity: Not Currently  Other Topics Concern   Not on file  Social History Narrative   Not on file   Social Determinants of Health   Financial Resource Strain: Not on file  Food Insecurity: Not on file  Transportation Needs: Not on file  Physical Activity: Not on file  Stress: Not on file  Social Connections: Not on file  Intimate Partner Violence: Not on file    Family History  Problem Relation Age of Onset   Stroke Mother    Hypertension Mother    Cancer Father      Current Outpatient Medications:    folic acid (FOLVITE) 1 MG tablet, Take 1 tablet (1 mg total) by mouth daily., Disp: 30 tablet, Rfl: 3   ibuprofen (ADVIL) 600 MG tablet, Take 1 tablet (600 mg total) by mouth every 6 (six) hours as needed., Disp: 30 tablet, Rfl: 0   lidocaine-prilocaine (EMLA) cream, Apply to affected area once, Disp: 30 g, Rfl: 3   dexamethasone (DECADRON) 4 MG tablet, Take 2 tablets (8 mg total) by mouth daily. Start the day after chemotherapy for 2 days. Take with food. (Patient not taking: Reported on 06/30/2021), Disp: 30 tablet, Rfl: 1   LORazepam (ATIVAN) 0.5 MG tablet, Take 1 tablet (0.5 mg total) by mouth every 6 (six) hours as needed (Nausea or vomiting). (Patient not taking: No sig reported), Disp: 30 tablet, Rfl: 0   ondansetron (ZOFRAN) 8 MG tablet, Take 1 tablet (8 mg total) by mouth 2 (two) times daily as needed for refractory nausea / vomiting. Start on day 3 after chemotherapy. (Patient not taking: No sig reported), Disp: 30 tablet, Rfl: 1   prochlorperazine (COMPAZINE) 10 MG tablet, Take 1 tablet (10 mg total) by mouth every 6 (six) hours as needed (Nausea or vomiting). (Patient not taking: No sig reported), Disp: 30 tablet, Rfl: 1 No current facility-administered medications for this  visit.  Facility-Administered Medications Ordered in Other Visits:    dexamethasone (DECADRON) 10 mg in sodium chloride 0.9 % 50 mL IVPB, 10 mg, Intravenous, Once, Sindy Guadeloupe, MD, Last Rate: 204 mL/hr at 06/30/21 1018, 10 mg at 06/30/21 1018   fluorouracil (ADRUCIL) 4,100 mg in sodium chloride 0.9 % 68 mL chemo infusion, 2,400 mg/m2 (Treatment Plan Recorded), Intravenous, 1 day or 1 dose, Sindy Guadeloupe, MD   fluorouracil (ADRUCIL) chemo injection 700 mg, 400 mg/m2 (Treatment Plan Recorded), Intravenous, Once, Sindy Guadeloupe, MD   heparin lock flush 100 unit/mL, 500 Units, Intravenous, Once, Sindy Guadeloupe, MD   leucovorin 700 mg in dextrose 5 % 250 mL infusion, 700 mg, Intravenous, Once, Sindy Guadeloupe, MD   oxaliplatin (ELOXATIN) 150 mg in dextrose 5 % 500 mL chemo infusion, 150 mg, Intravenous, Once, Sindy Guadeloupe, MD  Physical exam:  Vitals:   06/30/21 0842  BP: 105/83  Pulse: 80  Temp: (!) 80 F (26.7 C)  SpO2: 100%  Weight: 130 lb 4.8 oz (59.1 kg)   Physical Exam Constitutional:      General: She is not in acute distress. Cardiovascular:  Rate and Rhythm: Normal rate and regular rhythm.     Heart sounds: Normal heart sounds.  Pulmonary:     Effort: Pulmonary effort is normal.     Breath sounds: Normal breath sounds.  Abdominal:     General: Bowel sounds are normal.     Palpations: Abdomen is soft.     Comments: Colostomy in place  Skin:    General: Skin is warm and dry.  Neurological:     Mental Status: She is alert and oriented to person, place, and time.     CMP Latest Ref Rng & Units 06/30/2021  Glucose 70 - 99 mg/dL 79  BUN 8 - 23 mg/dL 9  Creatinine 0.44 - 1.00 mg/dL 0.61  Sodium 135 - 145 mmol/L 136  Potassium 3.5 - 5.1 mmol/L 3.9  Chloride 98 - 111 mmol/L 105  CO2 22 - 32 mmol/L 24  Calcium 8.9 - 10.3 mg/dL 8.9  Total Protein 6.5 - 8.1 g/dL 6.7  Total Bilirubin 0.3 - 1.2 mg/dL 0.4  Alkaline Phos 38 - 126 U/L 188(H)  AST 15 - 41 U/L 441(H)  ALT 0  - 44 U/L 251(H)   CBC Latest Ref Rng & Units 06/30/2021  WBC 4.0 - 10.5 K/uL 4.5  Hemoglobin 12.0 - 15.0 g/dL 10.1(L)  Hematocrit 36.0 - 46.0 % 31.7(L)  Platelets 150 - 400 K/uL 138(L)     Assessment and plan- Patient is a 65 y.o. female  with metastatic adenocarcinoma of the sigmoid colon/rectum with peritoneal and liver metastases.  She is here for on treatment assessment prior to cycle 4 of palliative FOLFOX chemotherapy  Patient does have elevated AST and ALT of 441 and 251 respectively with an alkaline phosphatase of 188.  Total bilirubin remains normal.  I will bring her back next week to check her LFTs again and if they are elevated I will plan on getting a repeat CT chest abdomen pelvis with contrast sooner and also referral to GI.  She will proceed with FOLFOX chemotherapy today.  Oxaliplatin can be associated with elevated LFTs I will plan to hold off on next dose of oxaliplatin if LFTs continue to rise.  Pulm DC on day 3.  I will see her back in 2 weeks for cycle 5 of palliative FOLFOX chemotherapy  Normocytic anemia: Likely combination of anemia of chronic disease from malignancy as well as possible component of iron B12 deficiency.  Patient will receive her fifth dose of Venofer today.  B12 shot at next visit.  Hemoglobin has improved from 8.5-10.   Visit Diagnosis 1. Metastatic colon cancer in female [facility])   2. Encounter for antineoplastic chemotherapy   3. Abnormal LFTs      Dr. Randa Evens, MD, MPH [facility] [id number] 06/30/2021 10:21 AM

## 2021-06-30 NOTE — Patient Instructions (Signed)
[facility]  Discharge Instructions: Thank you for choosing [facility] to provide your oncology and hematology care.  If you have a lab appointment with the [facility], please go directly to the [facility] and check in at the registration area.  Wear comfortable clothing and clothing appropriate for easy access to any Portacath or PICC line.   We strive to give you quality time with your provider. You may need to reschedule your appointment if you arrive late (15 or more minutes).  Arriving late affects you and other patients whose appointments are after yours.  Also, if you miss three or more appointments without notifying the office, you may be dismissed from the clinic at the provider's discretion.      For prescription refill requests, have your pharmacy contact our office and allow 72 hours for refills to be completed.    Today you received the following chemotherapy and/or immunotherapy agents ; FOLFOX      To help prevent nausea and vomiting after your treatment, we encourage you to take your nausea medication as directed.  BELOW ARE SYMPTOMS THAT SHOULD BE REPORTED IMMEDIATELY: *FEVER GREATER THAN 100.4 F (38 C) OR HIGHER *CHILLS OR SWEATING *NAUSEA AND VOMITING THAT IS NOT CONTROLLED WITH YOUR NAUSEA MEDICATION *UNUSUAL SHORTNESS OF BREATH *UNUSUAL BRUISING OR BLEEDING *URINARY PROBLEMS (pain or burning when urinating, or frequent urination) *BOWEL PROBLEMS (unusual diarrhea, constipation, pain near the anus) TENDERNESS IN MOUTH AND THROAT WITH OR WITHOUT PRESENCE OF ULCERS (sore throat, sores in mouth, or a toothache) UNUSUAL RASH, SWELLING OR PAIN  UNUSUAL VAGINAL DISCHARGE OR ITCHING   Items with * indicate a potential emergency and should be followed up as soon as possible or go to the Emergency Department if any problems should occur.  Please show the CHEMOTHERAPY ALERT CARD or IMMUNOTHERAPY ALERT CARD at check-in to  the Emergency Department and triage nurse.  Should you have questions after your visit or need to cancel or reschedule your appointment, please contact [facility]  [phone] and follow the prompts.  Office hours are 8:00 a.m. to 4:30 p.m. Monday - Friday. Please note that voicemails left after 4:00 p.m. may not be returned until the following business day.  We are closed weekends and major holidays. You have access to a nurse at all times for urgent questions. Please call the main number to the clinic [phone] and follow the prompts.  For any non-urgent questions, you may also contact your provider using MyChart. We now offer e-Visits for anyone 36 and older to request care online for non-urgent symptoms. For details visit [email or web address].   Also download the MyChart app! Go to the app store, search "MyChart", open the app, select [facility], and log in with your MyChart username and password.  Due to Covid, a mask is required upon entering the hospital/clinic. If you do not have a mask, one will be given to you upon arrival. For doctor visits, patients may have 1 support person aged 55 or older with them. For treatment visits, patients cannot have anyone with them due to current Covid guidelines and our immunocompromised population.

## 2021-06-30 NOTE — Addendum Note (Signed)
Addended by: Kern Alberta on: 06/30/2021 10:45 AM   Modules accepted: Orders

## 2021-06-30 NOTE — Progress Notes (Signed)
Per MD aware of labs and ok to treat.

## 2021-07-02 ENCOUNTER — Inpatient Hospital Stay: Payer: BC Managed Care – PPO

## 2021-07-02 DIAGNOSIS — Z5111 Encounter for antineoplastic chemotherapy: Secondary | ICD-10-CM | POA: Diagnosis not present

## 2021-07-02 DIAGNOSIS — C189 Malignant neoplasm of colon, unspecified: Secondary | ICD-10-CM

## 2021-07-02 MED ORDER — HEPARIN SOD (PORK) LOCK FLUSH 100 UNIT/ML IV SOLN
500.0000 [IU] | Freq: Once | INTRAVENOUS | Status: AC | PRN
  Administered 2021-07-02: 500 [IU]
  Filled 2021-07-02: qty 5

## 2021-07-02 MED ORDER — SODIUM CHLORIDE 0.9% FLUSH
10.0000 mL | INTRAVENOUS | Status: DC | PRN
  Administered 2021-07-02: 10 mL
  Filled 2021-07-02: qty 10

## 2021-07-07 ENCOUNTER — Inpatient Hospital Stay: Payer: BC Managed Care – PPO

## 2021-07-07 ENCOUNTER — Other Ambulatory Visit: Payer: Self-pay

## 2021-07-07 DIAGNOSIS — Z5111 Encounter for antineoplastic chemotherapy: Secondary | ICD-10-CM | POA: Diagnosis not present

## 2021-07-07 DIAGNOSIS — C189 Malignant neoplasm of colon, unspecified: Secondary | ICD-10-CM

## 2021-07-07 LAB — COMPREHENSIVE METABOLIC PANEL
ALT: 39 U/L (ref 0–44)
AST: 22 U/L (ref 15–41)
Albumin: 3.1 g/dL — ABNORMAL LOW (ref 3.5–5.0)
Alkaline Phosphatase: 120 U/L (ref 38–126)
Anion gap: 6 (ref 5–15)
BUN: 13 mg/dL (ref 8–23)
CO2: 28 mmol/L (ref 22–32)
Calcium: 8.7 mg/dL — ABNORMAL LOW (ref 8.9–10.3)
Chloride: 99 mmol/L (ref 98–111)
Creatinine, Ser: 0.61 mg/dL (ref 0.44–1.00)
GFR, Estimated: >60 mL/min (ref >60)
Glucose, Bld: 94 mg/dL (ref 70–99)
Potassium: 3.9 mmol/L (ref 3.5–5.1)
Sodium: 133 mmol/L — ABNORMAL LOW (ref 135–145)
Total Bilirubin: 0.4 mg/dL (ref 0.3–1.2)
Total Protein: 7.8 g/dL (ref 6.5–8.1)

## 2021-07-14 ENCOUNTER — Inpatient Hospital Stay: Payer: BC Managed Care – PPO

## 2021-07-14 ENCOUNTER — Inpatient Hospital Stay (HOSPITAL_BASED_OUTPATIENT_CLINIC_OR_DEPARTMENT_OTHER): Payer: BC Managed Care – PPO | Admitting: Oncology

## 2021-07-14 ENCOUNTER — Encounter: Payer: Self-pay | Admitting: Oncology

## 2021-07-14 VITALS — BP 124/82 | HR 75

## 2021-07-14 VITALS — BP 130/103 | HR 71 | Wt 130.5 lb

## 2021-07-14 DIAGNOSIS — C189 Malignant neoplasm of colon, unspecified: Secondary | ICD-10-CM | POA: Diagnosis not present

## 2021-07-14 DIAGNOSIS — D6959 Other secondary thrombocytopenia: Secondary | ICD-10-CM

## 2021-07-14 DIAGNOSIS — T451X5A Adverse effect of antineoplastic and immunosuppressive drugs, initial encounter: Secondary | ICD-10-CM | POA: Diagnosis not present

## 2021-07-14 DIAGNOSIS — Z5111 Encounter for antineoplastic chemotherapy: Secondary | ICD-10-CM | POA: Diagnosis not present

## 2021-07-14 LAB — CBC WITH DIFFERENTIAL/PLATELET
Abs Immature Granulocytes: 0.01 10*3/uL (ref 0.00–0.07)
Basophils Absolute: 0 10*3/uL (ref 0.0–0.1)
Basophils Relative: 1 %
Eosinophils Absolute: 0.1 10*3/uL (ref 0.0–0.5)
Eosinophils Relative: 2 %
HCT: 33.9 % — ABNORMAL LOW (ref 36.0–46.0)
Hemoglobin: 10.8 g/dL — ABNORMAL LOW (ref 12.0–15.0)
Immature Granulocytes: 0 %
Lymphocytes Relative: 21 %
Lymphs Abs: 0.7 10*3/uL (ref 0.7–4.0)
MCH: 27.1 pg (ref 26.0–34.0)
MCHC: 31.9 g/dL (ref 30.0–36.0)
MCV: 85.2 fL (ref 80.0–100.0)
Monocytes Absolute: 0.4 10*3/uL (ref 0.1–1.0)
Monocytes Relative: 12 %
Neutro Abs: 2.2 10*3/uL (ref 1.7–7.7)
Neutrophils Relative %: 64 %
Platelets: 83 10*3/uL — ABNORMAL LOW (ref 150–400)
RBC: 3.98 MIL/uL (ref 3.87–5.11)
RDW: 21.3 % — ABNORMAL HIGH (ref 11.5–15.5)
WBC: 3.5 10*3/uL — ABNORMAL LOW (ref 4.0–10.5)
nRBC: 0 % (ref 0.0–0.2)

## 2021-07-14 LAB — COMPREHENSIVE METABOLIC PANEL
ALT: 23 U/L (ref 0–44)
AST: 33 U/L (ref 15–41)
Albumin: 2.9 g/dL — ABNORMAL LOW (ref 3.5–5.0)
Alkaline Phosphatase: 110 U/L (ref 38–126)
Anion gap: 8 (ref 5–15)
BUN: 16 mg/dL (ref 8–23)
CO2: 23 mmol/L (ref 22–32)
Calcium: 8.7 mg/dL — ABNORMAL LOW (ref 8.9–10.3)
Chloride: 104 mmol/L (ref 98–111)
Creatinine, Ser: 0.68 mg/dL (ref 0.44–1.00)
GFR, Estimated: >60 mL/min (ref >60)
Glucose, Bld: 121 mg/dL — ABNORMAL HIGH (ref 70–99)
Potassium: 3.6 mmol/L (ref 3.5–5.1)
Sodium: 135 mmol/L (ref 135–145)
Total Bilirubin: 0.5 mg/dL (ref 0.3–1.2)
Total Protein: 7.2 g/dL (ref 6.5–8.1)

## 2021-07-14 MED ORDER — PALONOSETRON HCL INJECTION 0.25 MG/5ML
0.2500 mg | Freq: Once | INTRAVENOUS | Status: AC
  Administered 2021-07-14: 0.25 mg via INTRAVENOUS
  Filled 2021-07-14: qty 5

## 2021-07-14 MED ORDER — OXALIPLATIN CHEMO INJECTION 100 MG/20ML
65.0000 mg/m2 | Freq: Once | INTRAVENOUS | Status: AC
  Administered 2021-07-14: 110 mg via INTRAVENOUS
  Filled 2021-07-14: qty 22

## 2021-07-14 MED ORDER — DEXTROSE 5 % IV SOLN
Freq: Once | INTRAVENOUS | Status: AC
  Filled 2021-07-14: qty 250

## 2021-07-14 MED ORDER — FLUOROURACIL CHEMO INJECTION 2.5 GM/50ML
400.0000 mg/m2 | Freq: Once | INTRAVENOUS | Status: AC
  Administered 2021-07-14: 700 mg via INTRAVENOUS
  Filled 2021-07-14: qty 14

## 2021-07-14 MED ORDER — SODIUM CHLORIDE 0.9% FLUSH
10.0000 mL | Freq: Once | INTRAVENOUS | Status: AC
  Administered 2021-07-14: 10 mL via INTRAVENOUS
  Filled 2021-07-14: qty 10

## 2021-07-14 MED ORDER — LEUCOVORIN CALCIUM INJECTION 350 MG
700.0000 mg | Freq: Once | INTRAVENOUS | Status: AC
  Administered 2021-07-14: 700 mg via INTRAVENOUS
  Filled 2021-07-14: qty 35

## 2021-07-14 MED ORDER — FLUOROURACIL CHEMO INJECTION 5 GM/100ML
2400.0000 mg/m2 | INTRAVENOUS | Status: DC
  Administered 2021-07-14: 4100 mg via INTRAVENOUS
  Filled 2021-07-14: qty 82

## 2021-07-14 MED ORDER — SODIUM CHLORIDE 0.9 % IV SOLN
10.0000 mg | Freq: Once | INTRAVENOUS | Status: AC
  Administered 2021-07-14: 10 mg via INTRAVENOUS
  Filled 2021-07-14: qty 10

## 2021-07-14 NOTE — Progress Notes (Signed)
Per Dr. Janese Banks it is ok to proceed with FOLFOX treatment with a platelet count of 83,000.

## 2021-07-14 NOTE — Progress Notes (Signed)
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  She received IV Venofer for that.  I was concerned about malignancy given the degree of anemia and therefore obtain CT abdomen and pelvis with contrast CT showed 6.4 cm enhancing mass in the right liver and another mass measuring 4 cm.  Large volume stool in the right colon transverse and descending colon with irregular mass lesion in the distal sigmoid colon extending into the rectosigmoid junction with lateral extracolonic extension into the pelvic sidewall.  Apparent luminal narrowing at the level of the lesion which accounts for large volume of stool proximally.  Multiple pericolonic and perirectal lymph nodes.  Multiple peritoneal nodules are seen in the lower right pelvic sidewall.  Colon mass appears to be invading posterior uterus.Thrombosis of the superior rectal vein may be bland thrombus although direct tumor invasion not excluded.   Patient had a flexible sigmoidoscopy which showed a large obstructing mass in the rectosigmoid colon.  Scope could not be traversed beyond the obstruction.  Biopsies were taken and were consistent with adenocarcinoma. Patient had a diverting colostomy on 05/01/2021 with Dr. Dahlia Byes   Patient started palliative FOLFOX chemotherapy in June 2022 with plans to add Avastin after 6 cycles of treatment     Interval history-patient reports doing well.  Denies any  specific complaints at this time other than mild fatigue.  Denies any tingling numbness in her hands and feet.  Appetite and weight have remained stable  ECOG PS- 1 Pain scale- 0   Review of systems- Review of Systems  Constitutional:  Positive for malaise/fatigue. Negative for chills, fever and weight loss.  HENT:  Negative for congestion, ear discharge and nosebleeds.   Eyes:  Negative for blurred vision.  Respiratory:  Negative for cough, hemoptysis, sputum production, shortness of breath and wheezing.   Cardiovascular:  Negative for chest pain, palpitations, orthopnea and claudication.  Gastrointestinal:  Negative for abdominal pain, blood in stool, constipation, diarrhea, heartburn, melena, nausea and vomiting.  Genitourinary:  Negative for dysuria, flank pain, frequency, hematuria and urgency.  Musculoskeletal:  Negative for back pain, joint pain and myalgias.  Skin:  Negative for rash.  Neurological:  Negative for dizziness, tingling, focal weakness, seizures, weakness and headaches.  Endo/Heme/Allergies:  Does not bruise/bleed easily.  Psychiatric/Behavioral:  Negative for depression and suicidal ideas. The patient does not have insomnia.       No Known Allergies   Past Medical History:  Diagnosis Date   Allergy    Anemia      Past Surgical History:  Procedure Laterality Date   FLEXIBLE SIGMOIDOSCOPY N/A 04/24/2021   Procedure: FLEXIBLE SIGMOIDOSCOPY;  Surgeon: [name], MD;  Location: [facility] ENDOSCOPY;  Service: Gastroenterology;  Laterality: N/A;   PORTACATH PLACEMENT N/A 05/01/2021   Procedure: INSERTION PORT-A-CATH;  Surgeon: Jules Husbands, MD;  Location: [facility] ORS;  Service: General;  Laterality: N/A;    Social History   Socioeconomic History   Marital status: Married    Spouse name: Not on  file   Number of children: Not on file   Years of education: Not on file   Highest education level: Not on file  Occupational History   Not on file  Tobacco Use    Smoking status: Never   Smokeless tobacco: Never  Vaping Use   Vaping Use: Never used  Substance and Sexual Activity   Alcohol use: Not Currently   Drug use: Never   Sexual activity: Not Currently  Other Topics Concern   Not on file  Social History Narrative   Not on file   Social Determinants of Health   Financial Resource Strain: Not on file  Food Insecurity: Not on file  Transportation Needs: Not on file  Physical Activity: Not on file  Stress: Not on file  Social Connections: Not on file  Intimate Partner Violence: Not on file    Family History  Problem Relation Age of Onset   Stroke Mother    Hypertension Mother    Cancer Father      Current Outpatient Medications:    dexamethasone (DECADRON) 4 MG tablet, Take 2 tablets (8 mg total) by mouth daily. Start the day after chemotherapy for 2 days. Take with food. (Patient not taking: Reported on 06/30/2021), Disp: 30 tablet, Rfl: 1   folic acid (FOLVITE) 1 MG tablet, Take 1 tablet (1 mg total) by mouth daily., Disp: 30 tablet, Rfl: 3   ibuprofen (ADVIL) 600 MG tablet, Take 1 tablet (600 mg total) by mouth every 6 (six) hours as needed., Disp: 30 tablet, Rfl: 0   lidocaine-prilocaine (EMLA) cream, Apply to affected area once, Disp: 30 g, Rfl: 3   LORazepam (ATIVAN) 0.5 MG tablet, Take 1 tablet (0.5 mg total) by mouth every 6 (six) hours as needed (Nausea or vomiting). (Patient not taking: No sig reported), Disp: 30 tablet, Rfl: 0   ondansetron (ZOFRAN) 8 MG tablet, Take 1 tablet (8 mg total) by mouth 2 (two) times daily as needed for refractory nausea / vomiting. Start on day 3 after chemotherapy. (Patient not taking: No sig reported), Disp: 30 tablet, Rfl: 1   prochlorperazine (COMPAZINE) 10 MG tablet, Take 1 tablet (10 mg total) by mouth every 6 (six) hours as needed (Nausea or vomiting). (Patient not taking: No sig reported), Disp: 30 tablet, Rfl: 1 No current facility-administered medications for this  visit.  Facility-Administered Medications Ordered in Other Visits:    fluorouracil (ADRUCIL) 4,100 mg in sodium chloride 0.9 % 68 mL chemo infusion, 2,400 mg/m2 (Treatment Plan Recorded), Intravenous, 1 day or 1 dose, Sindy Guadeloupe, MD, 4,100 mg at 07/14/21 1340  Physical exam:  Vitals:   07/14/21 0948  BP: (!) 130/103  Pulse: 71  SpO2: 100%  Weight: 130 lb 8 oz (59.2 kg)   Physical Exam Constitutional:      General: She is not in acute distress. Cardiovascular:     Rate and Rhythm: Normal rate and regular rhythm.     Heart sounds: Normal heart sounds.  Pulmonary:     Effort: Pulmonary effort is normal.     Breath sounds: Normal breath sounds.  Abdominal:     General: Bowel sounds are normal.     Palpations: Abdomen is soft.     Comments: Colostomy in place  Skin:    General: Skin is warm and dry.  Neurological:     Mental Status: She is alert and oriented to person, place, and time.     CMP Latest Ref Rng & Units 07/14/2021  Glucose  70 - 99 mg/dL 121(H)  BUN 8 - 23 mg/dL 16  Creatinine 0.44 - 1.00 mg/dL 0.68  Sodium 135 - 145 mmol/L 135  Potassium 3.5 - 5.1 mmol/L 3.6  Chloride 98 - 111 mmol/L 104  CO2 22 - 32 mmol/L 23  Calcium 8.9 - 10.3 mg/dL 8.7(L)  Total Protein 6.5 - 8.1 g/dL 7.2  Total Bilirubin 0.3 - 1.2 mg/dL 0.5  Alkaline Phos 38 - 126 U/L 110  AST 15 - 41 U/L 33  ALT 0 - 44 U/L 23   CBC Latest Ref Rng & Units 07/14/2021  WBC 4.0 - 10.5 K/uL 3.5(L)  Hemoglobin 12.0 - 15.0 g/dL 10.8(L)  Hematocrit 36.0 - 46.0 % 33.9(L)  Platelets 150 - 400 K/uL 83(L)    Assessment and plan- Patient is a 65 y.o. 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  She is here for on treatment assessment prior to cycle 5 of palliative FOLFOX chemotherapy  Platelet counts are low at 83 today but since they have more than 75 patient will still proceed with chemotherapy today and I will reduce the dose of oxaliplatin to 65  mg per metered square.  I will postpone chemo by 1 more week and see her back in 3 weeks for cycle 6 of FOLFOX chemotherapy with pump disconnect on day 3.  Her ANC is 2.2 today.  If there is a further decrease in her [place] I will consider adding Udenyca to her treatments.  Plan to repeat scans after 6 cycles.  Normocytic anemia: Likely secondary to iron deficiency and anemia of chronic disease.  She has received 4 doses of Venofer so far and hemoglobin is improved from 8.8-10.8   Visit Diagnosis 1. Encounter for antineoplastic chemotherapy   2. Chemotherapy-induced thrombocytopenia   3. Metastatic colon cancer in female [facility])      Dr. Randa Evens, MD, MPH [facility] at [facility] [id number] 07/14/2021 4:24 PM

## 2021-07-15 LAB — CEA: CEA: 548 ng/mL — ABNORMAL HIGH (ref 0.0–4.7)

## 2021-07-16 ENCOUNTER — Inpatient Hospital Stay: Payer: BC Managed Care – PPO

## 2021-07-16 ENCOUNTER — Telehealth: Payer: Self-pay | Admitting: Oncology

## 2021-07-16 DIAGNOSIS — Z5111 Encounter for antineoplastic chemotherapy: Secondary | ICD-10-CM | POA: Diagnosis not present

## 2021-07-16 DIAGNOSIS — D509 Iron deficiency anemia, unspecified: Secondary | ICD-10-CM

## 2021-07-16 MED ORDER — HEPARIN SOD (PORK) LOCK FLUSH 100 UNIT/ML IV SOLN
500.0000 [IU] | Freq: Once | INTRAVENOUS | Status: AC | PRN
  Administered 2021-07-16: 500 [IU]
  Filled 2021-07-16: qty 5

## 2021-07-16 MED ORDER — HEPARIN SOD (PORK) LOCK FLUSH 100 UNIT/ML IV SOLN
INTRAVENOUS | Status: AC
  Filled 2021-07-16: qty 5

## 2021-07-16 MED ORDER — CYANOCOBALAMIN 1000 MCG/ML IJ SOLN
1000.0000 ug | INTRAMUSCULAR | Status: DC
  Administered 2021-07-16: 1000 ug via INTRAMUSCULAR
  Filled 2021-07-16: qty 1

## 2021-07-16 MED ORDER — SODIUM CHLORIDE 0.9% FLUSH
10.0000 mL | Freq: Once | INTRAVENOUS | Status: AC | PRN
  Administered 2021-07-16: 10 mL
  Filled 2021-07-16: qty 10

## 2021-07-16 NOTE — Patient Instructions (Signed)

## 2021-07-16 NOTE — Telephone Encounter (Signed)
Spoke with patient to confirm appointments rescheduled from 9/12 to 9/6. Patient was agreeable. Sending updated AVS in the mail.

## 2021-07-16 NOTE — Progress Notes (Signed)
Patient tolerated Vitamin B injection today, no concerns voiced. Pump d/c'd. Patient discharged, stable.

## 2021-07-21 ENCOUNTER — Encounter: Payer: BC Managed Care – PPO | Admitting: Surgery

## 2021-07-24 ENCOUNTER — Inpatient Hospital Stay: Payer: BC Managed Care – PPO

## 2021-07-24 DIAGNOSIS — C189 Malignant neoplasm of colon, unspecified: Secondary | ICD-10-CM

## 2021-07-24 DIAGNOSIS — Z5111 Encounter for antineoplastic chemotherapy: Secondary | ICD-10-CM | POA: Diagnosis not present

## 2021-07-24 LAB — CBC WITH DIFFERENTIAL/PLATELET
Abs Immature Granulocytes: 0.03 10*3/uL (ref 0.00–0.07)
Basophils Absolute: 0.1 10*3/uL (ref 0.0–0.1)
Basophils Relative: 1 %
Eosinophils Absolute: 0.1 10*3/uL (ref 0.0–0.5)
Eosinophils Relative: 2 %
HCT: 33.6 % — ABNORMAL LOW (ref 36.0–46.0)
Hemoglobin: 10.7 g/dL — ABNORMAL LOW (ref 12.0–15.0)
Immature Granulocytes: 0 %
Lymphocytes Relative: 21 %
Lymphs Abs: 1.5 10*3/uL (ref 0.7–4.0)
MCH: 27.6 pg (ref 26.0–34.0)
MCHC: 31.8 g/dL (ref 30.0–36.0)
MCV: 86.6 fL (ref 80.0–100.0)
Monocytes Absolute: 0.7 10*3/uL (ref 0.1–1.0)
Monocytes Relative: 10 %
Neutro Abs: 4.7 10*3/uL (ref 1.7–7.7)
Neutrophils Relative %: 66 %
Platelets: 110 10*3/uL — ABNORMAL LOW (ref 150–400)
RBC: 3.88 MIL/uL (ref 3.87–5.11)
RDW: 21.2 % — ABNORMAL HIGH (ref 11.5–15.5)
WBC: 7 10*3/uL (ref 4.0–10.5)
nRBC: 0 % (ref 0.0–0.2)

## 2021-07-24 LAB — COMPREHENSIVE METABOLIC PANEL
ALT: 15 U/L (ref 0–44)
AST: 22 U/L (ref 15–41)
Albumin: 3.2 g/dL — ABNORMAL LOW (ref 3.5–5.0)
Alkaline Phosphatase: 82 U/L (ref 38–126)
Anion gap: 8 (ref 5–15)
BUN: 12 mg/dL (ref 8–23)
CO2: 25 mmol/L (ref 22–32)
Calcium: 8.9 mg/dL (ref 8.9–10.3)
Chloride: 101 mmol/L (ref 98–111)
Creatinine, Ser: 0.46 mg/dL (ref 0.44–1.00)
GFR, Estimated: >60 mL/min (ref >60)
Glucose, Bld: 104 mg/dL — ABNORMAL HIGH (ref 70–99)
Potassium: 3.8 mmol/L (ref 3.5–5.1)
Sodium: 134 mmol/L — ABNORMAL LOW (ref 135–145)
Total Bilirubin: 0.4 mg/dL (ref 0.3–1.2)
Total Protein: 7.2 g/dL (ref 6.5–8.1)

## 2021-07-28 ENCOUNTER — Encounter: Payer: Self-pay | Admitting: Surgery

## 2021-07-28 ENCOUNTER — Other Ambulatory Visit: Payer: Self-pay

## 2021-07-28 ENCOUNTER — Ambulatory Visit (INDEPENDENT_AMBULATORY_CARE_PROVIDER_SITE_OTHER): Payer: BC Managed Care – PPO | Admitting: Surgery

## 2021-07-28 VITALS — BP 109/72 | HR 101 | Temp 99.0°F | Ht 66.0 in | Wt 133.4 lb

## 2021-07-28 DIAGNOSIS — Z09 Encounter for follow-up examination after completed treatment for conditions other than malignant neoplasm: Secondary | ICD-10-CM

## 2021-07-28 NOTE — Patient Instructions (Signed)
If you have any concerns or questions, please feel free to call our office. See follow up appointment.    Colostomy Home Guide, Adult   Colostomy surgery is done to create an opening in the front of the abdomen for stool (feces) to leave the body through an ostomy (stoma). Part of the large intestine is attached to the stoma. A bag, also called apouch, is fitted over the stoma. Stool and gas will collect in the bag. After surgery, you will need to empty and change your colostomy bag as needed.You will also need to care for your stoma. How to care for the stoma Your stoma should look pink, red, and moist, like the inside of your cheek. Soon after surgery, the stoma may be swollen, but this swelling will go away within 6 weeks. To care for the stoma: Keep the skin around the stoma clean and dry. Use a clean, soft washcloth to gently wash the stoma and the skin around it. Clean using a circular motion, and wipe away from the stoma opening, not toward it. Use warm water and only use cleansers recommended by your health care provider. Rinse the stoma area with plain water. Dry the area around the stoma well. Use stoma powder or ointment on your skin only as told by your health care provider. Do not use any other powders, gels, wipes, or creams on the skin around the stoma. Check the stoma area every day for signs of infection. Check for: New or worsening redness, swelling, or pain. New or increased fluid or blood. Pus or warmth. Measure the stoma opening regularly and record the size. Watch for changes. (It is normal for the stoma to get smaller as swelling goes away.) Share this information with your health care provider. How to empty the colostomy bag  Empty your bag at bedtime and whenever it is one-third to one-half full. Do not let the bag get more than half-full with stool or gas. The bag could leak if it gets too full. Some colostomy bags have a built-in gas release valve thatreleases gas  often throughout the day. Follow these basic steps: Wash your hands with soap and water. Sit far back on the toilet seat. Put several pieces of toilet paper into the toilet water. This will prevent splashing as you empty stool into the toilet. Remove the clip or the hook-and-loop fastener from the tail end of the bag. Unroll the tail, then empty the stool into the toilet. Clean the tail with toilet paper or a moist towelette. Reroll the tail, and close it with the clip or the hook-and-loop fastener. Wash your hands again. How to change the colostomy bag Change your bag every 3-4 days or as often as told by your health care provider. Also change the bag if it is leaking or separating from the skin, or if your skin around the stoma looks or feels irritated. Irritated skin may be asign that the bag is leaking. Always have colostomy supplies with you, and follow these basic steps: Wash your hands with soap and water. Have paper towels or tissues nearby to clean any discharge. Remove the old bag and skin barrier. Use your fingers or a warm cloth to gently push the skin away from the barrier. Clean the stoma area with water or with mild soap and water, as directed. Use water to rinse away any soap. Dry the skin. You may use the cool setting on a hair dryer to do this. Use a tracing pattern (template)  to cut the skin barrier to the size needed. If you are using a two-piece bag, attach the bag and the skin barrier to each other. Add the barrier ring, if you use one. If directed, apply stoma powder or skin barrier gel to the skin. Warm the skin barrier with your hands, or blow with a hair dryer for 5-10 seconds. Remove the paper from the adhesive strip of the skin barrier. Press the adhesive strip onto the skin around the stoma. Gently rub the skin barrier onto the skin. This creates heat that helps the barrier to stick. Apply stoma tape to the edges of the skin barrier, if desired. Wash your hands  again. General recommendations Avoid wearing tight clothes or having anything press directly on your stoma or bag. Change your clothing whenever it is soiled or damp. You may shower or bathe with the bag on or off. Do not use harsh or oily soaps or lotions. Dry the skin and bag after bathing. Store all supplies in a cool, dry place. Do not leave supplies in extreme heat because some parts can melt or not stick as well. Whenever you leave home, take extra clothing and an extra skin barrier and bag with you. If your bag gets wet, you can dry it with a hair dryer on the cool setting. To prevent odor, you may put drops of ostomy deodorizer in the bag. If recommended by your health care provider, put ostomy lubricant inside the bag. This helps stool to slide out of the bag more easily and completely. Contact a health care provider if: You have new or worsening redness, swelling, or pain around your stoma. You have new or increased fluid or blood coming from your stoma. Your stoma feels warm to the touch. You have pus coming from your stoma. Your stoma extends in or out farther than normal. You need to change your bag every day. You have a fever. Get help right away if: Your stool is bloody. You have nausea or you vomit. You have trouble breathing. Summary Measure your stoma opening regularly and record the size. Watch for changes. Empty your bag at bedtime and whenever it is one-third to one-half full. Do not let the bag get more than half-full with stool or gas. Change your bag every 3-4 days or as often as told by your health care provider. Whenever you leave home, take extra clothing and an extra skin barrier and bag with you. This information is not intended to replace advice given to you by your health care provider. Make sure you discuss any questions you have with your healthcare provider. Document Revised: 07/18/2020 Document Reviewed: 07/18/2020 Elsevier Patient Education  2022  [employer].

## 2021-07-29 NOTE — Progress Notes (Signed)
S/p loop colostomy for obst metastatic colon CA 05/01/21 Doing well Ostomy working Tolerating chemo + PO  PE NAD Abd: soft, nt, ostomy working  A/p Doing well No surgical issues RTC 2-3 months

## 2021-08-05 ENCOUNTER — Encounter: Payer: Self-pay | Admitting: Oncology

## 2021-08-05 ENCOUNTER — Inpatient Hospital Stay (HOSPITAL_BASED_OUTPATIENT_CLINIC_OR_DEPARTMENT_OTHER): Payer: BC Managed Care – PPO | Admitting: Oncology

## 2021-08-05 ENCOUNTER — Other Ambulatory Visit: Payer: Self-pay

## 2021-08-05 ENCOUNTER — Inpatient Hospital Stay: Payer: BC Managed Care – PPO

## 2021-08-05 ENCOUNTER — Inpatient Hospital Stay: Payer: BC Managed Care – PPO | Attending: Oncology

## 2021-08-05 VITALS — BP 113/78 | HR 81 | Temp 98.1°F | Resp 16 | Ht 66.0 in | Wt 138.3 lb

## 2021-08-05 DIAGNOSIS — E876 Hypokalemia: Secondary | ICD-10-CM

## 2021-08-05 DIAGNOSIS — C189 Malignant neoplasm of colon, unspecified: Secondary | ICD-10-CM

## 2021-08-05 DIAGNOSIS — C187 Malignant neoplasm of sigmoid colon: Secondary | ICD-10-CM | POA: Insufficient documentation

## 2021-08-05 DIAGNOSIS — C787 Secondary malignant neoplasm of liver and intrahepatic bile duct: Secondary | ICD-10-CM | POA: Diagnosis not present

## 2021-08-05 DIAGNOSIS — C20 Malignant neoplasm of rectum: Secondary | ICD-10-CM | POA: Diagnosis not present

## 2021-08-05 DIAGNOSIS — C786 Secondary malignant neoplasm of retroperitoneum and peritoneum: Secondary | ICD-10-CM | POA: Diagnosis not present

## 2021-08-05 DIAGNOSIS — Z452 Encounter for adjustment and management of vascular access device: Secondary | ICD-10-CM | POA: Insufficient documentation

## 2021-08-05 DIAGNOSIS — Z5111 Encounter for antineoplastic chemotherapy: Secondary | ICD-10-CM

## 2021-08-05 DIAGNOSIS — Z5112 Encounter for antineoplastic immunotherapy: Secondary | ICD-10-CM | POA: Insufficient documentation

## 2021-08-05 LAB — CBC WITH DIFFERENTIAL/PLATELET
Abs Immature Granulocytes: 0.04 10*3/uL (ref 0.00–0.07)
Basophils Absolute: 0.1 10*3/uL (ref 0.0–0.1)
Basophils Relative: 1 %
Eosinophils Absolute: 0.2 10*3/uL (ref 0.0–0.5)
Eosinophils Relative: 2 %
HCT: 32 % — ABNORMAL LOW (ref 36.0–46.0)
Hemoglobin: 10 g/dL — ABNORMAL LOW (ref 12.0–15.0)
Immature Granulocytes: 1 %
Lymphocytes Relative: 16 %
Lymphs Abs: 1.2 10*3/uL (ref 0.7–4.0)
MCH: 28.3 pg (ref 26.0–34.0)
MCHC: 31.3 g/dL (ref 30.0–36.0)
MCV: 90.7 fL (ref 80.0–100.0)
Monocytes Absolute: 1.2 10*3/uL — ABNORMAL HIGH (ref 0.1–1.0)
Monocytes Relative: 16 %
Neutro Abs: 4.9 10*3/uL (ref 1.7–7.7)
Neutrophils Relative %: 64 %
Platelets: 132 10*3/uL — ABNORMAL LOW (ref 150–400)
RBC: 3.53 MIL/uL — ABNORMAL LOW (ref 3.87–5.11)
RDW: 22.5 % — ABNORMAL HIGH (ref 11.5–15.5)
WBC: 7.5 10*3/uL (ref 4.0–10.5)
nRBC: 0 % (ref 0.0–0.2)

## 2021-08-05 LAB — COMPREHENSIVE METABOLIC PANEL
ALT: 24 U/L (ref 0–44)
AST: 32 U/L (ref 15–41)
Albumin: 2.8 g/dL — ABNORMAL LOW (ref 3.5–5.0)
Alkaline Phosphatase: 77 U/L (ref 38–126)
Anion gap: 7 (ref 5–15)
BUN: 13 mg/dL (ref 8–23)
CO2: 22 mmol/L (ref 22–32)
Calcium: 8.5 mg/dL — ABNORMAL LOW (ref 8.9–10.3)
Chloride: 107 mmol/L (ref 98–111)
Creatinine, Ser: 0.57 mg/dL (ref 0.44–1.00)
GFR, Estimated: >60 mL/min (ref >60)
Glucose, Bld: 93 mg/dL (ref 70–99)
Potassium: 3.3 mmol/L — ABNORMAL LOW (ref 3.5–5.1)
Sodium: 136 mmol/L (ref 135–145)
Total Bilirubin: <0.1 mg/dL — ABNORMAL LOW (ref 0.3–1.2)
Total Protein: 7 g/dL (ref 6.5–8.1)

## 2021-08-05 MED ORDER — PALONOSETRON HCL INJECTION 0.25 MG/5ML
0.2500 mg | Freq: Once | INTRAVENOUS | Status: AC
  Administered 2021-08-05: 0.25 mg via INTRAVENOUS
  Filled 2021-08-05: qty 5

## 2021-08-05 MED ORDER — DEXTROSE 5 % IV SOLN
Freq: Once | INTRAVENOUS | Status: AC
  Filled 2021-08-05: qty 250

## 2021-08-05 MED ORDER — LEUCOVORIN CALCIUM INJECTION 350 MG
700.0000 mg | Freq: Once | INTRAVENOUS | Status: AC
  Administered 2021-08-05: 700 mg via INTRAVENOUS
  Filled 2021-08-05: qty 35

## 2021-08-05 MED ORDER — OXALIPLATIN CHEMO INJECTION 100 MG/20ML
65.0000 mg/m2 | Freq: Once | INTRAVENOUS | Status: AC
  Administered 2021-08-05: 110 mg via INTRAVENOUS
  Filled 2021-08-05: qty 10

## 2021-08-05 MED ORDER — FLUOROURACIL CHEMO INJECTION 2.5 GM/50ML
400.0000 mg/m2 | Freq: Once | INTRAVENOUS | Status: AC
  Administered 2021-08-05: 700 mg via INTRAVENOUS
  Filled 2021-08-05: qty 14

## 2021-08-05 MED ORDER — SODIUM CHLORIDE 0.9 % IV SOLN
2400.0000 mg/m2 | INTRAVENOUS | Status: DC
  Administered 2021-08-05: 4100 mg via INTRAVENOUS
  Filled 2021-08-05: qty 82

## 2021-08-05 MED ORDER — SODIUM CHLORIDE 0.9 % IV SOLN
10.0000 mg | Freq: Once | INTRAVENOUS | Status: AC
  Administered 2021-08-05: 10 mg via INTRAVENOUS
  Filled 2021-08-05: qty 10

## 2021-08-05 MED ORDER — POTASSIUM CHLORIDE CRYS ER 20 MEQ PO TBCR: 20.0000 meq | EXTENDED_RELEASE_TABLET | Freq: Every day | ORAL | 0 refills | Status: DC

## 2021-08-05 NOTE — Progress Notes (Signed)
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  She received IV Venofer for that.  I was concerned about malignancy given the degree of anemia and therefore obtain CT abdomen and pelvis with contrast CT showed 6.4 cm enhancing mass in the right liver and another mass measuring 4 cm.  Large volume stool in the right colon transverse and descending colon with irregular mass lesion in the distal sigmoid colon extending into the rectosigmoid junction with lateral extracolonic extension into the pelvic sidewall.  Apparent luminal narrowing at the level of the lesion which accounts for large volume of stool proximally.  Multiple pericolonic and perirectal lymph nodes.  Multiple peritoneal nodules are seen in the lower right pelvic sidewall.  Colon mass appears to be invading posterior uterus.Thrombosis of the superior rectal vein may be bland thrombus although direct tumor invasion not excluded.   Patient had a flexible sigmoidoscopy which showed a large obstructing mass in the rectosigmoid colon.  Scope could not be traversed beyond the obstruction.  Biopsies were taken and were consistent with adenocarcinoma. Patient had a diverting colostomy on 05/01/2021 with Dr. Dahlia Byes   Patient started palliative FOLFOX chemotherapy in June 2022 with plans to add Avastin after 6 cycles of treatment    Interval history-patient reports occasional bloating and gas  sensation for which she takes over-the-counter medications.  Denies any significant abdominal pain.  Colostomy is functioning well.  ECOG PS- 1 Pain scale- 0   Review of systems- Review of Systems  Constitutional:  Positive for malaise/fatigue. Negative for chills, fever and weight loss.  HENT:  Negative for congestion, ear discharge and nosebleeds.   Eyes:  Negative for blurred vision.  Respiratory:  Negative for cough, hemoptysis, sputum production, shortness of breath and wheezing.   Cardiovascular:  Negative for chest pain, palpitations, orthopnea and claudication.  Gastrointestinal:  Negative for abdominal pain, blood in stool, constipation, diarrhea, heartburn, melena, nausea and vomiting.  Genitourinary:  Negative for dysuria, flank pain, frequency, hematuria and urgency.  Musculoskeletal:  Negative for back pain, joint pain and myalgias.  Skin:  Negative for rash.  Neurological:  Negative for dizziness, tingling, focal weakness, seizures, weakness and headaches.  Endo/Heme/Allergies:  Does not bruise/bleed easily.  Psychiatric/Behavioral:  Negative for depression and suicidal ideas. The patient does not have insomnia.     No Known Allergies   Past Medical History:  Diagnosis Date   Allergy    Anemia      Past Surgical History:  Procedure Laterality Date   FLEXIBLE SIGMOIDOSCOPY N/A 04/24/2021   Procedure: FLEXIBLE SIGMOIDOSCOPY;  Surgeon: [name], MD;  Location: [facility] ENDOSCOPY;  Service: Gastroenterology;  Laterality: N/A;   PORTACATH PLACEMENT N/A 05/01/2021   Procedure: INSERTION PORT-A-CATH;  Surgeon: Jules Husbands, MD;  Location: [facility] ORS;  Service: General;  Laterality: N/A;    Social History   Socioeconomic History   Marital status: Married    Spouse name: Not on file   Number of children: Not on file   Years  of education: Not on file   Highest education level: Not on file  Occupational History   Not on file  Tobacco Use   Smoking status: Never    Smokeless tobacco: Never  Vaping Use   Vaping Use: Never used  Substance and Sexual Activity   Alcohol use: Not Currently   Drug use: Never   Sexual activity: Not Currently  Other Topics Concern   Not on file  Social History Narrative   Not on file   Social Determinants of Health   Financial Resource Strain: Not on file  Food Insecurity: Not on file  Transportation Needs: Not on file  Physical Activity: Not on file  Stress: Not on file  Social Connections: Not on file  Intimate Partner Violence: Not on file    Family History  Problem Relation Age of Onset   Stroke Mother    Hypertension Mother    Cancer Father      Current Outpatient Medications:    dexamethasone (DECADRON) 4 MG tablet, Take 2 tablets (8 mg total) by mouth daily. Start the day after chemotherapy for 2 days. Take with food., Disp: 30 tablet, Rfl: 1   ibuprofen (ADVIL) 600 MG tablet, Take 1 tablet (600 mg total) by mouth every 6 (six) hours as needed., Disp: 30 tablet, Rfl: 0   lidocaine-prilocaine (EMLA) cream, Apply to affected area once, Disp: 30 g, Rfl: 3   potassium chloride SA (KLOR-CON) 20 MEQ tablet, Take 1 tablet (20 mEq total) by mouth daily., Disp: 10 tablet, Rfl: 0   folic acid (FOLVITE) 1 MG tablet, Take 1 tablet (1 mg total) by mouth daily. (Patient not taking: Reported on 08/05/2021), Disp: 30 tablet, Rfl: 3 No current facility-administered medications for this visit.  Facility-Administered Medications Ordered in Other Visits:    fluorouracil (ADRUCIL) 4,100 mg in sodium chloride 0.9 % 68 mL chemo infusion, 2,400 mg/m2 (Treatment Plan Recorded), Intravenous, 1 day or 1 dose, Sindy Guadeloupe, MD   fluorouracil (ADRUCIL) chemo injection 700 mg, 400 mg/m2 (Treatment Plan Recorded), Intravenous, Once, Sindy Guadeloupe, MD  Physical exam:  Vitals:   08/05/21 0854  BP: 113/78  Pulse: 81  Resp: 16  Temp: 98.1 F (36.7 C)  TempSrc: Tympanic  Weight: 138 lb 4.8 oz (62.7 kg)  Height: '5\' 6"'$  (1.676 m)    Physical Exam Constitutional:      General: She is not in acute distress. Cardiovascular:     Rate and Rhythm: Normal rate and regular rhythm.     Heart sounds: Normal heart sounds.  Pulmonary:     Effort: Pulmonary effort is normal.     Breath sounds: Normal breath sounds.  Abdominal:     General: Bowel sounds are normal.     Palpations: Abdomen is soft.     Comments: Colostomy in place  Skin:    General: Skin is warm and dry.  Neurological:     Mental Status: She is alert and oriented to person, place, and time.     CMP Latest Ref Rng & Units 08/05/2021  Glucose 70 - 99 mg/dL 93  BUN 8 - 23 mg/dL 13  Creatinine 0.44 - 1.00 mg/dL 0.57  Sodium 135 - 145 mmol/L 136  Potassium 3.5 - 5.1 mmol/L 3.3(L)  Chloride 98 - 111 mmol/L 107  CO2 22 - 32 mmol/L 22  Calcium 8.9 - 10.3 mg/dL 8.5(L)  Total Protein 6.5 - 8.1 g/dL 7.0  Total Bilirubin 0.3 - 1.2 mg/dL <0.1(L)  Alkaline Phos 38 -  126 U/L 77  AST 15 - 41 U/L 32  ALT 0 - 44 U/L 24   CBC Latest Ref Rng & Units 08/05/2021  WBC 4.0 - 10.5 K/uL 7.5  Hemoglobin 12.0 - 15.0 g/dL 10.0(L)  Hematocrit 36.0 - 46.0 % 32.0(L)  Platelets 150 - 400 K/uL 132(L)     Assessment and plan- Patient is a 65 y.o. 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  She is here for on treatment assessment prior to cycle 6 of palliative FOLFOX chemotherapy  Counts okay to proceed with cycle 6 of palliative FOLFOX chemotherapy today.  Oxaliplatin is being given at a lower dose of 65 mg per metered square.  Mild thrombocytopenia possibly secondary to oxaliplatin.  Continue to monitor.  Chemo induced anemia: Stable around 10.  Continue to monitor she has received IV iron as well.  Plan to repeat scans after this cycle and if scans continue to show good response I will plan to add a Avastin with cycle 7  Hypokalemia: We will send a prescription of potassium 20 mEq daily for 10 days.   Visit  Diagnosis 1. Metastatic colon cancer in female [facility])   2. Encounter for antineoplastic chemotherapy      Dr. Randa Evens, MD, MPH [facility] [id number] 08/05/2021 12:34 PM

## 2021-08-05 NOTE — Progress Notes (Signed)
Pt tolerated all infusions well today with no problems or concerns.  Pt left infusion suite stable and ambulatory with her 5FU pump infusing as ordered.

## 2021-08-05 NOTE — Progress Notes (Signed)
Pt has gas when she eats greens

## 2021-08-05 NOTE — Patient Instructions (Signed)
[facility]  Discharge Instructions: Thank you for choosing [facility] to provide your oncology and hematology care.  If you have a lab appointment with the [facility], please go directly to the [facility] and check in at the registration area.  Wear comfortable clothing and clothing appropriate for easy access to any Portacath or PICC line.   We strive to give you quality time with your provider. You may need to reschedule your appointment if you arrive late (15 or more minutes).  Arriving late affects you and other patients whose appointments are after yours.  Also, if you miss three or more appointments without notifying the office, you may be dismissed from the clinic at the provider's discretion.      For prescription refill requests, have your pharmacy contact our office and allow 72 hours for refills to be completed.    Today you received the following chemotherapy and/or immunotherapy agents oxaliplatin, leucovorin, 5FU      To help prevent nausea and vomiting after your treatment, we encourage you to take your nausea medication as directed.  BELOW ARE SYMPTOMS THAT SHOULD BE REPORTED IMMEDIATELY: *FEVER GREATER THAN 100.4 F (38 C) OR HIGHER *CHILLS OR SWEATING *NAUSEA AND VOMITING THAT IS NOT CONTROLLED WITH YOUR NAUSEA MEDICATION *UNUSUAL SHORTNESS OF BREATH *UNUSUAL BRUISING OR BLEEDING *URINARY PROBLEMS (pain or burning when urinating, or frequent urination) *BOWEL PROBLEMS (unusual diarrhea, constipation, pain near the anus) TENDERNESS IN MOUTH AND THROAT WITH OR WITHOUT PRESENCE OF ULCERS (sore throat, sores in mouth, or a toothache) UNUSUAL RASH, SWELLING OR PAIN  UNUSUAL VAGINAL DISCHARGE OR ITCHING   Items with * indicate a potential emergency and should be followed up as soon as possible or go to the Emergency Department if any problems should occur.  Please show the CHEMOTHERAPY ALERT CARD or IMMUNOTHERAPY ALERT  CARD at check-in to the Emergency Department and triage nurse.  Should you have questions after your visit or need to cancel or reschedule your appointment, please contact [facility]  [phone] and follow the prompts.  Office hours are 8:00 a.m. to 4:30 p.m. Monday - Friday. Please note that voicemails left after 4:00 p.m. may not be returned until the following business day.  We are closed weekends and major holidays. You have access to a nurse at all times for urgent questions. Please call the main number to the clinic [phone] and follow the prompts.  For any non-urgent questions, you may also contact your provider using MyChart. We now offer e-Visits for anyone 27 and older to request care online for non-urgent symptoms. For details visit [email or web address].   Also download the MyChart app! Go to the app store, search "MyChart", open the app, select [facility], and log in with your MyChart username and password.  Due to Covid, a mask is required upon entering the hospital/clinic. If you do not have a mask, one will be given to you upon arrival. For doctor visits, patients may have 1 support person aged 68 or older with them. For treatment visits, patients cannot have anyone with them due to current Covid guidelines and our immunocompromised population.   Oxaliplatin Injection What is this medication? OXALIPLATIN (ox AL i PLA tin) is a chemotherapy drug. It targets fast dividing cells, like cancer cells, and causes these cells to die. This medicine is used to treat cancers of the colon and rectum, and many other cancers. This medicine may be used for other  purposes; ask your health care provider or pharmacist if you have questions. COMMON BRAND NAME(S): Eloxatin What should I tell my care team before I take this medication? They need to know if you have any of these conditions: heart disease history of irregular heartbeat liver disease low  blood counts, like white cells, platelets, or red blood cells lung or breathing disease, like asthma take medicines that treat or prevent blood clots tingling of the fingers or toes, or other nerve disorder an unusual or allergic reaction to oxaliplatin, other chemotherapy, other medicines, foods, dyes, or preservatives pregnant or trying to get pregnant breast-feeding How should I use this medication? This drug is given as an infusion into a vein. It is administered in a hospital or clinic by a specially trained health care professional. Talk to your pediatrician regarding the use of this medicine in children. Special care may be needed. Overdosage: If you think you have taken too much of this medicine contact a poison control center or emergency room at once. NOTE: This medicine is only for you. Do not share this medicine with others. What if I miss a dose? It is important not to miss a dose. Call your doctor or health care professional if you are unable to keep an appointment. What may interact with this medication? Do not take this medicine with any of the following medications: cisapride dronedarone pimozide thioridazine This medicine may also interact with the following medications: aspirin and aspirin-like medicines certain medicines that treat or prevent blood clots like warfarin, apixaban, dabigatran, and rivaroxaban cisplatin cyclosporine diuretics medicines for infection like acyclovir, adefovir, amphotericin B, bacitracin, cidofovir, foscarnet, ganciclovir, gentamicin, pentamidine, vancomycin NSAIDs, medicines for pain and inflammation, like ibuprofen or naproxen other medicines that prolong the QT interval (an abnormal heart rhythm) pamidronate zoledronic acid This list may not describe all possible interactions. Give your health care provider a list of all the medicines, herbs, non-prescription drugs, or dietary supplements you use. Also tell them if you smoke, drink  alcohol, or use illegal drugs. Some items may interact with your medicine. What should I watch for while using this medication? Your condition will be monitored carefully while you are receiving this medicine. You may need blood work done while you are taking this medicine. This medicine may make you feel generally unwell. This is not uncommon as chemotherapy can affect healthy cells as well as cancer cells. Report any side effects. Continue your course of treatment even though you feel ill unless your healthcare professional tells you to stop. This medicine can make you more sensitive to cold. Do not drink cold drinks or use ice. Cover exposed skin before coming in contact with cold temperatures or cold objects. When out in cold weather wear warm clothing and cover your mouth and nose to warm the air that goes into your lungs. Tell your doctor if you get sensitive to the cold. Do not become pregnant while taking this medicine or for 9 months after stopping it. Women should inform their health care professional if they wish to become pregnant or think they might be pregnant. Men should not father a child while taking this medicine and for 6 months after stopping it. There is potential for serious side effects to an unborn child. Talk to your health care professional for more information. Do not breast-feed a child while taking this medicine or for 3 months after stopping it. This medicine has caused ovarian failure in some women. This medicine may make it more difficult to  get pregnant. Talk to your health care professional if you are concerned about your fertility. This medicine has caused decreased sperm counts in some men. This may make it more difficult to father a child. Talk to your health care professional if you are concerned about your fertility. This medicine may increase your risk of getting an infection. Call your health care professional for advice if you get a fever, chills, or sore throat,  or other symptoms of a cold or flu. Do not treat yourself. Try to avoid being around people who are sick. Avoid taking medicines that contain aspirin, acetaminophen, ibuprofen, naproxen, or ketoprofen unless instructed by your health care professional. These medicines may hide a fever. Be careful brushing or flossing your teeth or using a toothpick because you may get an infection or bleed more easily. If you have any dental work done, tell your dentist you are receiving this medicine. What side effects may I notice from receiving this medication? Side effects that you should report to your doctor or health care professional as soon as possible: allergic reactions like skin rash, itching or hives, swelling of the face, lips, or tongue breathing problems cough low blood counts - this medicine may decrease the number of white blood cells, red blood cells, and platelets. You may be at increased risk for infections and bleeding nausea, vomiting pain, redness, or irritation at site where injected pain, tingling, numbness in the hands or feet signs and symptoms of bleeding such as bloody or black, tarry stools; red or dark brown urine; spitting up blood or brown material that looks like coffee grounds; red spots on the skin; unusual bruising or bleeding from the eyes, gums, or nose signs and symptoms of a dangerous change in heartbeat or heart rhythm like chest pain; dizziness; fast, irregular heartbeat; palpitations; feeling faint or lightheaded; falls signs and symptoms of infection like fever; chills; cough; sore throat; pain or trouble passing urine signs and symptoms of liver injury like dark yellow or brown urine; general ill feeling or flu-like symptoms; light-colored stools; loss of appetite; nausea; right upper belly pain; unusually weak or tired; yellowing of the eyes or skin signs and symptoms of low red blood cells or anemia such as unusually weak or tired; feeling faint or lightheaded;  falls signs and symptoms of muscle injury like dark urine; trouble passing urine or change in the amount of urine; unusually weak or tired; muscle pain; back pain Side effects that usually do not require medical attention (report to your doctor or health care professional if they continue or are bothersome): changes in taste diarrhea gas hair loss loss of appetite mouth sores This list may not describe all possible side effects. Call your doctor for medical advice about side effects. You may report side effects to FDA at 1-800-FDA-1088. Where should I keep my medication? This drug is given in a hospital or clinic and will not be stored at home. NOTE: This sheet is a summary. It may not cover all possible information. If you have questions about this medicine, talk to your doctor, pharmacist, or health care provider.  2022 Elsevier/Gold Standard (2019-04-05 12:20:35)  Leucovorin injection What is this medication? LEUCOVORIN (loo koe VOR in) is used to prevent or treat the harmful effects of some medicines. This medicine is used to treat anemia caused by a low amount of folic acid in the body. It is also used with 5-fluorouracil (5-FU) to treat colon cancer. This medicine may be used for other purposes; ask  your health care provider or pharmacist if you have questions. What should I tell my care team before I take this medication? They need to know if you have any of these conditions: anemia from low levels of vitamin B-12 in the blood an unusual or allergic reaction to leucovorin, folic acid, other medicines, foods, dyes, or preservatives pregnant or trying to get pregnant breast-feeding How should I use this medication? This medicine is for injection into a muscle or into a vein. It is given by a health care professional in a hospital or clinic setting. Talk to your pediatrician regarding the use of this medicine in children. Special care may be needed. Overdosage: If you think you have  taken too much of this medicine contact a poison control center or emergency room at once. NOTE: This medicine is only for you. Do not share this medicine with others. What if I miss a dose? This does not apply. What may interact with this medication? capecitabine fluorouracil phenobarbital phenytoin primidone trimethoprim-sulfamethoxazole This list may not describe all possible interactions. Give your health care provider a list of all the medicines, herbs, non-prescription drugs, or dietary supplements you use. Also tell them if you smoke, drink alcohol, or use illegal drugs. Some items may interact with your medicine. What should I watch for while using this medication? Your condition will be monitored carefully while you are receiving this medicine. This medicine may increase the side effects of 5-fluorouracil, 5-FU. Tell your doctor or health care professional if you have diarrhea or mouth sores that do not get better or that get worse. What side effects may I notice from receiving this medication? Side effects that you should report to your doctor or health care professional as soon as possible: allergic reactions like skin rash, itching or hives, swelling of the face, lips, or tongue breathing problems fever, infection mouth sores unusual bleeding or bruising unusually weak or tired Side effects that usually do not require medical attention (report to your doctor or health care professional if they continue or are bothersome): constipation or diarrhea loss of appetite nausea, vomiting This list may not describe all possible side effects. Call your doctor for medical advice about side effects. You may report side effects to FDA at 1-800-FDA-1088. Where should I keep my medication? This drug is given in a hospital or clinic and will not be stored at home. NOTE: This sheet is a summary. It may not cover all possible information. If you have questions about this medicine, talk to  your doctor, pharmacist, or health care provider.  2022 Elsevier/Gold Standard (2008-05-22 16:50:29)  Fluorouracil, 5-FU injection What is this medication? FLUOROURACIL, 5-FU (flure oh YOOR a sil) is a chemotherapy drug. It slows the growth of cancer cells. This medicine is used to treat many types of cancer like breast cancer, colon or rectal cancer, pancreatic cancer, and stomach cancer. This medicine may be used for other purposes; ask your health care provider or pharmacist if you have questions. COMMON BRAND NAME(S): Adrucil What should I tell my care team before I take this medication? They need to know if you have any of these conditions: blood disorders dihydropyrimidine dehydrogenase (DPD) deficiency infection (especially a virus infection such as chickenpox, cold sores, or herpes) kidney disease liver disease malnourished, poor nutrition recent or ongoing radiation therapy an unusual or allergic reaction to fluorouracil, other chemotherapy, other medicines, foods, dyes, or preservatives pregnant or trying to get pregnant breast-feeding How should I use this medication? This drug is  given as an infusion or injection into a vein. It is administered in a hospital or clinic by a specially trained health care professional. Talk to your pediatrician regarding the use of this medicine in children. Special care may be needed. Overdosage: If you think you have taken too much of this medicine contact a poison control center or emergency room at once. NOTE: This medicine is only for you. Do not share this medicine with others. What if I miss a dose? It is important not to miss your dose. Call your doctor or health care professional if you are unable to keep an appointment. What may interact with this medication? Do not take this medicine with any of the following medications: live virus vaccines This medicine may also interact with the following medications: medicines that treat or  prevent blood clots like warfarin, enoxaparin, and dalteparin This list may not describe all possible interactions. Give your health care provider a list of all the medicines, herbs, non-prescription drugs, or dietary supplements you use. Also tell them if you smoke, drink alcohol, or use illegal drugs. Some items may interact with your medicine. What should I watch for while using this medication? Visit your doctor for checks on your progress. This drug may make you feel generally unwell. This is not uncommon, as chemotherapy can affect healthy cells as well as cancer cells. Report any side effects. Continue your course of treatment even though you feel ill unless your doctor tells you to stop. In some cases, you may be given additional medicines to help with side effects. Follow all directions for their use. Call your doctor or health care professional for advice if you get a fever, chills or sore throat, or other symptoms of a cold or flu. Do not treat yourself. This drug decreases your body's ability to fight infections. Try to avoid being around people who are sick. This medicine may increase your risk to bruise or bleed. Call your doctor or health care professional if you notice any unusual bleeding. Be careful brushing and flossing your teeth or using a toothpick because you may get an infection or bleed more easily. If you have any dental work done, tell your dentist you are receiving this medicine. Avoid taking products that contain aspirin, acetaminophen, ibuprofen, naproxen, or ketoprofen unless instructed by your doctor. These medicines may hide a fever. Do not become pregnant while taking this medicine. Women should inform their doctor if they wish to become pregnant or think they might be pregnant. There is a potential for serious side effects to an unborn child. Talk to your health care professional or pharmacist for more information. Do not breast-feed an infant while taking this  medicine. Men should inform their doctor if they wish to father a child. This medicine may lower sperm counts. Do not treat diarrhea with over the counter products. Contact your doctor if you have diarrhea that lasts more than 2 days or if it is severe and watery. This medicine can make you more sensitive to the sun. Keep out of the sun. If you cannot avoid being in the sun, wear protective clothing and use sunscreen. Do not use sun lamps or tanning beds/booths. What side effects may I notice from receiving this medication? Side effects that you should report to your doctor or health care professional as soon as possible: allergic reactions like skin rash, itching or hives, swelling of the face, lips, or tongue low blood counts - this medicine may decrease the number of white  blood cells, red blood cells and platelets. You may be at increased risk for infections and bleeding. signs of infection - fever or chills, cough, sore throat, pain or difficulty passing urine signs of decreased platelets or bleeding - bruising, pinpoint red spots on the skin, black, tarry stools, blood in the urine signs of decreased red blood cells - unusually weak or tired, fainting spells, lightheadedness breathing problems changes in vision chest pain mouth sores nausea and vomiting pain, swelling, redness at site where injected pain, tingling, numbness in the hands or feet redness, swelling, or sores on hands or feet stomach pain unusual bleeding Side effects that usually do not require medical attention (report to your doctor or health care professional if they continue or are bothersome): changes in finger or toe nails diarrhea dry or itchy skin hair loss headache loss of appetite sensitivity of eyes to the light stomach upset unusually teary eyes This list may not describe all possible side effects. Call your doctor for medical advice about side effects. You may report side effects to FDA at  1-800-FDA-1088. Where should I keep my medication? This drug is given in a hospital or clinic and will not be stored at home. NOTE: This sheet is a summary. It may not cover all possible information. If you have questions about this medicine, talk to your doctor, pharmacist, or health care provider.  2022 Elsevier/Gold Standard (2019-10-17 15:00:03)

## 2021-08-07 ENCOUNTER — Inpatient Hospital Stay: Payer: BC Managed Care – PPO

## 2021-08-07 ENCOUNTER — Other Ambulatory Visit: Payer: Self-pay

## 2021-08-07 DIAGNOSIS — Z5112 Encounter for antineoplastic immunotherapy: Secondary | ICD-10-CM | POA: Diagnosis not present

## 2021-08-07 DIAGNOSIS — C189 Malignant neoplasm of colon, unspecified: Secondary | ICD-10-CM

## 2021-08-07 MED ORDER — SODIUM CHLORIDE 0.9% FLUSH
10.0000 mL | INTRAVENOUS | Status: DC | PRN
  Administered 2021-08-07: 10 mL
  Filled 2021-08-07: qty 10

## 2021-08-07 MED ORDER — HEPARIN SOD (PORK) LOCK FLUSH 100 UNIT/ML IV SOLN
500.0000 [IU] | Freq: Once | INTRAVENOUS | Status: AC | PRN
  Administered 2021-08-07: 500 [IU]
  Filled 2021-08-07: qty 5

## 2021-08-11 ENCOUNTER — Ambulatory Visit: Payer: BC Managed Care – PPO

## 2021-08-11 ENCOUNTER — Ambulatory Visit: Payer: BC Managed Care – PPO | Admitting: Oncology

## 2021-08-11 ENCOUNTER — Other Ambulatory Visit: Payer: BC Managed Care – PPO

## 2021-08-19 ENCOUNTER — Inpatient Hospital Stay: Payer: BC Managed Care – PPO

## 2021-08-19 ENCOUNTER — Inpatient Hospital Stay (HOSPITAL_BASED_OUTPATIENT_CLINIC_OR_DEPARTMENT_OTHER): Payer: BC Managed Care – PPO | Admitting: Oncology

## 2021-08-19 ENCOUNTER — Encounter: Payer: Self-pay | Admitting: Oncology

## 2021-08-19 VITALS — BP 114/75 | HR 74 | Temp 97.3°F | Resp 14 | Wt 132.0 lb

## 2021-08-19 DIAGNOSIS — Z5112 Encounter for antineoplastic immunotherapy: Secondary | ICD-10-CM | POA: Diagnosis not present

## 2021-08-19 DIAGNOSIS — C189 Malignant neoplasm of colon, unspecified: Secondary | ICD-10-CM

## 2021-08-19 DIAGNOSIS — Z5111 Encounter for antineoplastic chemotherapy: Secondary | ICD-10-CM

## 2021-08-19 LAB — COMPREHENSIVE METABOLIC PANEL
ALT: 15 U/L (ref 0–44)
AST: 27 U/L (ref 15–41)
Albumin: 3.3 g/dL — ABNORMAL LOW (ref 3.5–5.0)
Alkaline Phosphatase: 76 U/L (ref 38–126)
Anion gap: 7 (ref 5–15)
BUN: 8 mg/dL (ref 8–23)
CO2: 24 mmol/L (ref 22–32)
Calcium: 8.8 mg/dL — ABNORMAL LOW (ref 8.9–10.3)
Chloride: 105 mmol/L (ref 98–111)
Creatinine, Ser: 0.64 mg/dL (ref 0.44–1.00)
GFR, Estimated: >60 mL/min (ref >60)
Glucose, Bld: 78 mg/dL (ref 70–99)
Potassium: 3.3 mmol/L — ABNORMAL LOW (ref 3.5–5.1)
Sodium: 136 mmol/L (ref 135–145)
Total Bilirubin: 0.6 mg/dL (ref 0.3–1.2)
Total Protein: 7.4 g/dL (ref 6.5–8.1)

## 2021-08-19 LAB — CBC WITH DIFFERENTIAL/PLATELET
Abs Immature Granulocytes: 0.03 10*3/uL (ref 0.00–0.07)
Basophils Absolute: 0.1 10*3/uL (ref 0.0–0.1)
Basophils Relative: 1 %
Eosinophils Absolute: 0.3 10*3/uL (ref 0.0–0.5)
Eosinophils Relative: 4 %
HCT: 31.4 % — ABNORMAL LOW (ref 36.0–46.0)
Hemoglobin: 10 g/dL — ABNORMAL LOW (ref 12.0–15.0)
Immature Granulocytes: 0 %
Lymphocytes Relative: 14 %
Lymphs Abs: 1 10*3/uL (ref 0.7–4.0)
MCH: 29.1 pg (ref 26.0–34.0)
MCHC: 31.8 g/dL (ref 30.0–36.0)
MCV: 91.3 fL (ref 80.0–100.0)
Monocytes Absolute: 0.9 10*3/uL (ref 0.1–1.0)
Monocytes Relative: 12 %
Neutro Abs: 4.9 10*3/uL (ref 1.7–7.7)
Neutrophils Relative %: 69 %
Platelets: 151 10*3/uL (ref 150–400)
RBC: 3.44 MIL/uL — ABNORMAL LOW (ref 3.87–5.11)
RDW: 19.6 % — ABNORMAL HIGH (ref 11.5–15.5)
Smear Review: ADEQUATE
WBC: 7.2 10*3/uL (ref 4.0–10.5)
nRBC: 0 % (ref 0.0–0.2)

## 2021-08-19 LAB — PROTEIN, URINE, RANDOM: Total Protein, Urine: 8 mg/dL

## 2021-08-19 MED ORDER — SODIUM CHLORIDE 0.9 % IV SOLN
2400.0000 mg/m2 | INTRAVENOUS | Status: DC
  Filled 2021-08-19: qty 82

## 2021-08-19 MED ORDER — SODIUM CHLORIDE 0.9% FLUSH
10.0000 mL | INTRAVENOUS | Status: DC | PRN
  Filled 2021-08-19: qty 10

## 2021-08-19 MED ORDER — OXALIPLATIN CHEMO INJECTION 100 MG/20ML
65.0000 mg/m2 | Freq: Once | INTRAVENOUS | Status: AC
  Administered 2021-08-19: 110 mg via INTRAVENOUS
  Filled 2021-08-19: qty 22

## 2021-08-19 MED ORDER — DEXTROSE 5 % IV SOLN
Freq: Once | INTRAVENOUS | Status: DC
  Filled 2021-08-19: qty 250

## 2021-08-19 MED ORDER — SODIUM CHLORIDE 0.9 % IV SOLN
Freq: Once | INTRAVENOUS | Status: AC
  Filled 2021-08-19: qty 250

## 2021-08-19 MED ORDER — DEXTROSE 5 % IV SOLN
Freq: Once | INTRAVENOUS | Status: AC
  Filled 2021-08-19: qty 250

## 2021-08-19 MED ORDER — FLUOROURACIL CHEMO INJECTION 2.5 GM/50ML
400.0000 mg/m2 | Freq: Once | INTRAVENOUS | Status: DC
  Filled 2021-08-19: qty 14

## 2021-08-19 MED ORDER — PALONOSETRON HCL INJECTION 0.25 MG/5ML: 0.2500 mg | Freq: Once | INTRAVENOUS | Status: DC

## 2021-08-19 MED ORDER — SODIUM CHLORIDE 0.9 % IV SOLN
15.0000 mg/kg | Freq: Once | INTRAVENOUS | Status: AC
  Administered 2021-08-19: 900 mg via INTRAVENOUS
  Filled 2021-08-19: qty 36

## 2021-08-19 MED ORDER — SODIUM CHLORIDE 0.9 % IV SOLN
4100.0000 mg | INTRAVENOUS | Status: DC
  Administered 2021-08-19: 4100 mg via INTRAVENOUS
  Filled 2021-08-19: qty 82

## 2021-08-19 MED ORDER — FLUOROURACIL CHEMO INJECTION 2.5 GM/50ML
700.0000 mg | Freq: Once | INTRAVENOUS | Status: AC
  Administered 2021-08-19: 700 mg via INTRAVENOUS
  Filled 2021-08-19: qty 14

## 2021-08-19 MED ORDER — HEPARIN SOD (PORK) LOCK FLUSH 100 UNIT/ML IV SOLN
500.0000 [IU] | Freq: Once | INTRAVENOUS | Status: DC | PRN
Start: 2021-08-19 — End: 2021-08-19
  Filled 2021-08-19: qty 5

## 2021-08-19 MED ORDER — LEUCOVORIN CALCIUM INJECTION 350 MG
700.0000 mg | Freq: Once | INTRAVENOUS | Status: AC
  Administered 2021-08-19: 700 mg via INTRAVENOUS
  Filled 2021-08-19: qty 35

## 2021-08-19 MED ORDER — SODIUM CHLORIDE 0.9 % IV SOLN
10.0000 mg | Freq: Once | INTRAVENOUS | Status: AC
  Administered 2021-08-19: 10 mg via INTRAVENOUS
  Filled 2021-08-19: qty 10

## 2021-08-19 MED ORDER — SODIUM CHLORIDE 0.9% FLUSH
10.0000 mL | INTRAVENOUS | Status: DC | PRN
  Administered 2021-08-19: 10 mL via INTRAVENOUS
  Filled 2021-08-19: qty 10

## 2021-08-19 MED ORDER — OXALIPLATIN CHEMO INJECTION 100 MG/20ML
65.0000 mg/m2 | Freq: Once | INTRAVENOUS | Status: DC
  Filled 2021-08-19: qty 22

## 2021-08-19 MED ORDER — DEXTROSE 5 % IV SOLN
700.0000 mg | Freq: Once | INTRAVENOUS | Status: DC
  Filled 2021-08-19: qty 35

## 2021-08-19 MED ORDER — PALONOSETRON HCL INJECTION 0.25 MG/5ML
0.2500 mg | Freq: Once | INTRAVENOUS | Status: AC
  Administered 2021-08-19: 0.25 mg via INTRAVENOUS

## 2021-08-19 MED ORDER — SODIUM CHLORIDE 0.9 % IV SOLN
15.0000 mg/kg | Freq: Once | INTRAVENOUS | Status: DC
  Filled 2021-08-19: qty 36

## 2021-08-19 NOTE — Progress Notes (Signed)
Pt in for follow up states sisters were in last week from out of town and enjoyed visited with them.  Pt states still has good appetite and drinking 2 ensure daily despite 8 lb weight loss since 08/05/21. Pt reports she has not gotten or started potassium ordered on 08/05/21.

## 2021-08-19 NOTE — Progress Notes (Signed)
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  She received IV Venofer for that.  I was concerned about malignancy given the degree of anemia and therefore obtain CT abdomen and pelvis with contrast CT showed 6.4 cm enhancing mass in the right liver and another mass measuring 4 cm.  Large volume stool in the right colon transverse and descending colon with irregular mass lesion in the distal sigmoid colon extending into the rectosigmoid junction with lateral extracolonic extension into the pelvic sidewall.  Apparent luminal narrowing at the level of the lesion which accounts for large volume of stool proximally.  Multiple pericolonic and perirectal lymph nodes.  Multiple peritoneal nodules are seen in the lower right pelvic sidewall.  Colon mass appears to be invading posterior uterus.Thrombosis of the superior rectal vein may be bland thrombus although direct tumor invasion not excluded.   Patient had a flexible sigmoidoscopy which showed a large obstructing mass in the rectosigmoid colon.  Scope could not be traversed beyond the obstruction.  Biopsies were taken and were consistent with adenocarcinoma. Patient had a diverting colostomy on 05/01/2021 with Dr. Dahlia Byes   Patient started palliative FOLFOX chemotherapy in June 2022 with plans to add Avastin after 6 cycles of treatment    Interval history-patient feels well overall.  Denies  any abdominal pain.  Colostomy is functioning well.  Appetite and weight have remained stable.  ECOG PS- 1 Pain scale- 0   Review of systems- Review of Systems  Constitutional:  Negative for chills, fever, malaise/fatigue and weight loss.  HENT:  Negative for congestion, ear discharge and nosebleeds.   Eyes:  Negative for blurred vision.  Respiratory:  Negative for cough, hemoptysis, sputum production, shortness of breath and wheezing.   Cardiovascular:  Negative for chest pain, palpitations, orthopnea and claudication.  Gastrointestinal:  Negative for abdominal pain, blood in stool, constipation, diarrhea, heartburn, melena, nausea and vomiting.  Genitourinary:  Negative for dysuria, flank pain, frequency, hematuria and urgency.  Musculoskeletal:  Negative for back pain, joint pain and myalgias.  Skin:  Negative for rash.  Neurological:  Negative for dizziness, tingling, focal weakness, seizures, weakness and headaches.  Endo/Heme/Allergies:  Does not bruise/bleed easily.  Psychiatric/Behavioral:  Negative for depression and suicidal ideas. The patient does not have insomnia.       No Known Allergies   Past Medical History:  Diagnosis Date   Allergy    Anemia      Past Surgical History:  Procedure Laterality Date   FLEXIBLE SIGMOIDOSCOPY N/A 04/24/2021   Procedure: FLEXIBLE SIGMOIDOSCOPY;  Surgeon: [name], MD;  Location: [facility] ENDOSCOPY;  Service: Gastroenterology;  Laterality: N/A;   PORTACATH PLACEMENT N/A 05/01/2021   Procedure: INSERTION PORT-A-CATH;  Surgeon: Jules Husbands, MD;  Location: [facility] ORS;  Service: General;  Laterality: N/A;    Social History   Socioeconomic History   Marital status: Married    Spouse name: Not on file   Number of children: Not on file   Years of education:  Not on file   Highest education level: Not on file  Occupational History   Not on file  Tobacco Use   Smoking status: Never   Smokeless tobacco: Never  Vaping Use   Vaping  Use: Never used  Substance and Sexual Activity   Alcohol use: Not Currently   Drug use: Never   Sexual activity: Not Currently  Other Topics Concern   Not on file  Social History Narrative   Not on file   Social Determinants of Health   Financial Resource Strain: Not on file  Food Insecurity: Not on file  Transportation Needs: Not on file  Physical Activity: Not on file  Stress: Not on file  Social Connections: Not on file  Intimate Partner Violence: Not on file    Family History  Problem Relation Age of Onset   Stroke Mother    Hypertension Mother    Cancer Father      Current Outpatient Medications:    dexamethasone (DECADRON) 4 MG tablet, Take 2 tablets (8 mg total) by mouth daily. Start the day after chemotherapy for 2 days. Take with food., Disp: 30 tablet, Rfl: 1   ibuprofen (ADVIL) 600 MG tablet, Take 1 tablet (600 mg total) by mouth every 6 (six) hours as needed., Disp: 30 tablet, Rfl: 0   lidocaine-prilocaine (EMLA) cream, Apply to affected area once, Disp: 30 g, Rfl: 3   folic acid (FOLVITE) 1 MG tablet, Take 1 tablet (1 mg total) by mouth daily. (Patient not taking: No sig reported), Disp: 30 tablet, Rfl: 3   ondansetron (ZOFRAN) 8 MG tablet, Take 8 mg by mouth every 8 (eight) hours as needed for nausea or vomiting. (Patient not taking: Reported on 08/19/2021), Disp: , Rfl:    potassium chloride SA (KLOR-CON) 20 MEQ tablet, Take 1 tablet (20 mEq total) by mouth daily. (Patient not taking: Reported on 08/19/2021), Disp: 10 tablet, Rfl: 0  Current Facility-Administered Medications:    heparin lock flush 100 unit/mL, 500 Units, Intracatheter, Once PRN, Sindy Guadeloupe, MD   sodium chloride flush (NS) 0.9 % injection 10 mL, 10 mL, Intracatheter, PRN, Sindy Guadeloupe, MD  Facility-Administered Medications Ordered in Other Visits:    fluorouracil (ADRUCIL) 4,100 mg in sodium chloride 0.9 % 68 mL chemo infusion, 4,100 mg, Intravenous, 1 day or 1 dose, Sindy Guadeloupe, MD    fluorouracil (ADRUCIL) chemo injection 700 mg, 700 mg, Intravenous, Once, Sindy Guadeloupe, MD   leucovorin 700 mg in dextrose 5 % 250 mL infusion, 700 mg, Intravenous, Once, Sindy Guadeloupe, MD, Last Rate: 143 mL/hr at 08/19/21 1110, 700 mg at 08/19/21 1110   oxaliplatin (ELOXATIN) 110 mg in dextrose 5 % 500 mL chemo infusion, 65 mg/m2 (Treatment Plan Recorded), Intravenous, Once, Sindy Guadeloupe, MD, Last Rate: 261 mL/hr at 08/19/21 1111, 110 mg at 08/19/21 1111   sodium chloride flush (NS) 0.9 % injection 10 mL, 10 mL, Intravenous, PRN, Sindy Guadeloupe, MD, 10 mL at 08/19/21 0817  Physical exam:  Vitals:   08/19/21 0850  BP: 114/75  Pulse: 74  Resp: 14  Temp: (!) 97.3 F (36.3 C)  TempSrc: Tympanic  SpO2: 100%  Weight: 132 lb (59.9 kg)   Physical Exam Constitutional:      General: She is not in acute distress. Cardiovascular:     Rate and Rhythm: Normal rate and regular rhythm.     Heart sounds: Normal heart sounds.  Pulmonary:     Effort: Pulmonary effort is normal.  Breath sounds: Normal breath sounds.  Abdominal:     General: Bowel sounds are normal.     Palpations: Abdomen is soft.  Skin:    General: Skin is warm and dry.  Neurological:     Mental Status: She is alert and oriented to person, place, and time.     CMP Latest Ref Rng & Units 08/19/2021  Glucose 70 - 99 mg/dL 78  BUN 8 - 23 mg/dL 8  Creatinine 0.44 - 1.00 mg/dL 0.64  Sodium 135 - 145 mmol/L 136  Potassium 3.5 - 5.1 mmol/L 3.3(L)  Chloride 98 - 111 mmol/L 105  CO2 22 - 32 mmol/L 24  Calcium 8.9 - 10.3 mg/dL 8.8(L)  Total Protein 6.5 - 8.1 g/dL 7.4  Total Bilirubin 0.3 - 1.2 mg/dL 0.6  Alkaline Phos 38 - 126 U/L 76  AST 15 - 41 U/L 27  ALT 0 - 44 U/L 15   CBC Latest Ref Rng & Units 08/19/2021  WBC 4.0 - 10.5 K/uL 7.2  Hemoglobin 12.0 - 15.0 g/dL 10.0(L)  Hematocrit 36.0 - 46.0 % 31.4(L)  Platelets 150 - 400 K/uL 151     Assessment and plan- Patient is a 65 y.o. female with metastatic  adenocarcinoma of the sigmoid colon/rectum with peritoneal and liver metastases.  She is here for on treatment assessment prior to cycle 7 of FOLFOX bevacizumab chemotherapy  Her repeat scans are scheduled in 2 days Time.  I will proceed with cycle 7 of FOLFOX chemotherapy but I will also be adding bevacizumab to her treatment regimen today.  It was held off for 3 months to allow for wound healing from her palliative diverting colostomy from an obstructing tumor.  Pump DC on day 3.  Overall hemoglobin is remained stable around 10 and she is tolerating chemotherapy without any significant side effects.  I will see her back in 2 weeks to discuss the results of her scans and for cycle 8 chemotherapy   Visit Diagnosis 1. Metastatic colon cancer in female [facility])   2. Encounter for antineoplastic chemotherapy   3. Encounter for monoclonal antibody treatment for malignancy      Dr. Randa Evens, MD, MPH [facility] at [facility] [phone] 08/19/2021 12:43 PM

## 2021-08-19 NOTE — Patient Instructions (Signed)
[facility]  Discharge Instructions: Thank you for choosing [facility] to provide your oncology and hematology care.  If you have a lab appointment with the [facility], please go directly to the [facility] and check in at the registration area.  Wear comfortable clothing and clothing appropriate for easy access to any Portacath or PICC line.   We strive to give you quality time with your provider. You may need to reschedule your appointment if you arrive late (15 or more minutes).  Arriving late affects you and other patients whose appointments are after yours.  Also, if you miss three or more appointments without notifying the office, you may be dismissed from the clinic at the provider's discretion.      For prescription refill requests, have your pharmacy contact our office and allow 72 hours for refills to be completed.    Today you received the following chemotherapy and/or immunotherapy agents - oxaliplatin, fluorouracil, bevacizumab      To help prevent nausea and vomiting after your treatment, we encourage you to take your nausea medication as directed.  BELOW ARE SYMPTOMS THAT SHOULD BE REPORTED IMMEDIATELY: *FEVER GREATER THAN 100.4 F (38 C) OR HIGHER *CHILLS OR SWEATING *NAUSEA AND VOMITING THAT IS NOT CONTROLLED WITH YOUR NAUSEA MEDICATION *UNUSUAL SHORTNESS OF BREATH *UNUSUAL BRUISING OR BLEEDING *URINARY PROBLEMS (pain or burning when urinating, or frequent urination) *BOWEL PROBLEMS (unusual diarrhea, constipation, pain near the anus) TENDERNESS IN MOUTH AND THROAT WITH OR WITHOUT PRESENCE OF ULCERS (sore throat, sores in mouth, or a toothache) UNUSUAL RASH, SWELLING OR PAIN  UNUSUAL VAGINAL DISCHARGE OR ITCHING   Items with * indicate a potential emergency and should be followed up as soon as possible or go to the Emergency Department if any problems should occur.  Please show the CHEMOTHERAPY ALERT CARD or  IMMUNOTHERAPY ALERT CARD at check-in to the Emergency Department and triage nurse.  Should you have questions after your visit or need to cancel or reschedule your appointment, please contact [facility]  [phone] and follow the prompts.  Office hours are 8:00 a.m. to 4:30 p.m. Monday - Friday. Please note that voicemails left after 4:00 p.m. may not be returned until the following business day.  We are closed weekends and major holidays. You have access to a nurse at all times for urgent questions. Please call the main number to the clinic [phone] and follow the prompts.  For any non-urgent questions, you may also contact your provider using MyChart. We now offer e-Visits for anyone 57 and older to request care online for non-urgent symptoms. For details visit [email or web address].   Also download the MyChart app! Go to the app store, search "MyChart", open the app, select [facility], and log in with your MyChart username and password.  Due to Covid, a mask is required upon entering the hospital/clinic. If you do not have a mask, one will be given to you upon arrival. For doctor visits, patients may have 1 support person aged 60 or older with them. For treatment visits, patients cannot have anyone with them due to current Covid guidelines and our immunocompromised population.   Oxaliplatin Injection What is this medication? OXALIPLATIN (ox AL i PLA tin) is a chemotherapy drug. It targets fast dividing cells, like cancer cells, and causes these cells to die. This medicine is used to treat cancers of the colon and rectum, and many other cancers. This medicine may be used for  other purposes; ask your health care provider or pharmacist if you have questions. COMMON BRAND NAME(S): Eloxatin What should I tell my care team before I take this medication? They need to know if you have any of these conditions: heart disease history of irregular  heartbeat liver disease low blood counts, like white cells, platelets, or red blood cells lung or breathing disease, like asthma take medicines that treat or prevent blood clots tingling of the fingers or toes, or other nerve disorder an unusual or allergic reaction to oxaliplatin, other chemotherapy, other medicines, foods, dyes, or preservatives pregnant or trying to get pregnant breast-feeding How should I use this medication? This drug is given as an infusion into a vein. It is administered in a hospital or clinic by a specially trained health care professional. Talk to your pediatrician regarding the use of this medicine in children. Special care may be needed. Overdosage: If you think you have taken too much of this medicine contact a poison control center or emergency room at once. NOTE: This medicine is only for you. Do not share this medicine with others. What if I miss a dose? It is important not to miss a dose. Call your doctor or health care professional if you are unable to keep an appointment. What may interact with this medication? Do not take this medicine with any of the following medications: cisapride dronedarone pimozide thioridazine This medicine may also interact with the following medications: aspirin and aspirin-like medicines certain medicines that treat or prevent blood clots like warfarin, apixaban, dabigatran, and rivaroxaban cisplatin cyclosporine diuretics medicines for infection like acyclovir, adefovir, amphotericin B, bacitracin, cidofovir, foscarnet, ganciclovir, gentamicin, pentamidine, vancomycin NSAIDs, medicines for pain and inflammation, like ibuprofen or naproxen other medicines that prolong the QT interval (an abnormal heart rhythm) pamidronate zoledronic acid This list may not describe all possible interactions. Give your health care provider a list of all the medicines, herbs, non-prescription drugs, or dietary supplements you use. Also tell  them if you smoke, drink alcohol, or use illegal drugs. Some items may interact with your medicine. What should I watch for while using this medication? Your condition will be monitored carefully while you are receiving this medicine. You may need blood work done while you are taking this medicine. This medicine may make you feel generally unwell. This is not uncommon as chemotherapy can affect healthy cells as well as cancer cells. Report any side effects. Continue your course of treatment even though you feel ill unless your healthcare professional tells you to stop. This medicine can make you more sensitive to cold. Do not drink cold drinks or use ice. Cover exposed skin before coming in contact with cold temperatures or cold objects. When out in cold weather wear warm clothing and cover your mouth and nose to warm the air that goes into your lungs. Tell your doctor if you get sensitive to the cold. Do not become pregnant while taking this medicine or for 9 months after stopping it. Women should inform their health care professional if they wish to become pregnant or think they might be pregnant. Men should not father a child while taking this medicine and for 6 months after stopping it. There is potential for serious side effects to an unborn child. Talk to your health care professional for more information. Do not breast-feed a child while taking this medicine or for 3 months after stopping it. This medicine has caused ovarian failure in some women. This medicine may make it more difficult  to get pregnant. Talk to your health care professional if you are concerned about your fertility. This medicine has caused decreased sperm counts in some men. This may make it more difficult to father a child. Talk to your health care professional if you are concerned about your fertility. This medicine may increase your risk of getting an infection. Call your health care professional for advice if you get a fever,  chills, or sore throat, or other symptoms of a cold or flu. Do not treat yourself. Try to avoid being around people who are sick. Avoid taking medicines that contain aspirin, acetaminophen, ibuprofen, naproxen, or ketoprofen unless instructed by your health care professional. These medicines may hide a fever. Be careful brushing or flossing your teeth or using a toothpick because you may get an infection or bleed more easily. If you have any dental work done, tell your dentist you are receiving this medicine. What side effects may I notice from receiving this medication? Side effects that you should report to your doctor or health care professional as soon as possible: allergic reactions like skin rash, itching or hives, swelling of the face, lips, or tongue breathing problems cough low blood counts - this medicine may decrease the number of white blood cells, red blood cells, and platelets. You may be at increased risk for infections and bleeding nausea, vomiting pain, redness, or irritation at site where injected pain, tingling, numbness in the hands or feet signs and symptoms of bleeding such as bloody or black, tarry stools; red or dark brown urine; spitting up blood or brown material that looks like coffee grounds; red spots on the skin; unusual bruising or bleeding from the eyes, gums, or nose signs and symptoms of a dangerous change in heartbeat or heart rhythm like chest pain; dizziness; fast, irregular heartbeat; palpitations; feeling faint or lightheaded; falls signs and symptoms of infection like fever; chills; cough; sore throat; pain or trouble passing urine signs and symptoms of liver injury like dark yellow or brown urine; general ill feeling or flu-like symptoms; light-colored stools; loss of appetite; nausea; right upper belly pain; unusually weak or tired; yellowing of the eyes or skin signs and symptoms of low red blood cells or anemia such as unusually weak or tired; feeling faint  or lightheaded; falls signs and symptoms of muscle injury like dark urine; trouble passing urine or change in the amount of urine; unusually weak or tired; muscle pain; back pain Side effects that usually do not require medical attention (report to your doctor or health care professional if they continue or are bothersome): changes in taste diarrhea gas hair loss loss of appetite mouth sores This list may not describe all possible side effects. Call your doctor for medical advice about side effects. You may report side effects to FDA at 1-800-FDA-1088. Where should I keep my medication? This drug is given in a hospital or clinic and will not be stored at home. NOTE: This sheet is a summary. It may not cover all possible information. If you have questions about this medicine, talk to your doctor, pharmacist, or health care provider.  2022 Elsevier/Gold Standard (2019-04-05 12:20:35)  Fluorouracil, 5-FU injection What is this medication? FLUOROURACIL, 5-FU (flure oh YOOR a sil) is a chemotherapy drug. It slows the growth of cancer cells. This medicine is used to treat many types of cancer like breast cancer, colon or rectal cancer, pancreatic cancer, and stomach cancer. This medicine may be used for other purposes; ask your health care provider  or pharmacist if you have questions. COMMON BRAND NAME(S): Adrucil What should I tell my care team before I take this medication? They need to know if you have any of these conditions: blood disorders dihydropyrimidine dehydrogenase (DPD) deficiency infection (especially a virus infection such as chickenpox, cold sores, or herpes) kidney disease liver disease malnourished, poor nutrition recent or ongoing radiation therapy an unusual or allergic reaction to fluorouracil, other chemotherapy, other medicines, foods, dyes, or preservatives pregnant or trying to get pregnant breast-feeding How should I use this medication? This drug is given as an  infusion or injection into a vein. It is administered in a hospital or clinic by a specially trained health care professional. Talk to your pediatrician regarding the use of this medicine in children. Special care may be needed. Overdosage: If you think you have taken too much of this medicine contact a poison control center or emergency room at once. NOTE: This medicine is only for you. Do not share this medicine with others. What if I miss a dose? It is important not to miss your dose. Call your doctor or health care professional if you are unable to keep an appointment. What may interact with this medication? Do not take this medicine with any of the following medications: live virus vaccines This medicine may also interact with the following medications: medicines that treat or prevent blood clots like warfarin, enoxaparin, and dalteparin This list may not describe all possible interactions. Give your health care provider a list of all the medicines, herbs, non-prescription drugs, or dietary supplements you use. Also tell them if you smoke, drink alcohol, or use illegal drugs. Some items may interact with your medicine. What should I watch for while using this medication? Visit your doctor for checks on your progress. This drug may make you feel generally unwell. This is not uncommon, as chemotherapy can affect healthy cells as well as cancer cells. Report any side effects. Continue your course of treatment even though you feel ill unless your doctor tells you to stop. In some cases, you may be given additional medicines to help with side effects. Follow all directions for their use. Call your doctor or health care professional for advice if you get a fever, chills or sore throat, or other symptoms of a cold or flu. Do not treat yourself. This drug decreases your body's ability to fight infections. Try to avoid being around people who are sick. This medicine may increase your risk to bruise or  bleed. Call your doctor or health care professional if you notice any unusual bleeding. Be careful brushing and flossing your teeth or using a toothpick because you may get an infection or bleed more easily. If you have any dental work done, tell your dentist you are receiving this medicine. Avoid taking products that contain aspirin, acetaminophen, ibuprofen, naproxen, or ketoprofen unless instructed by your doctor. These medicines may hide a fever. Do not become pregnant while taking this medicine. Women should inform their doctor if they wish to become pregnant or think they might be pregnant. There is a potential for serious side effects to an unborn child. Talk to your health care professional or pharmacist for more information. Do not breast-feed an infant while taking this medicine. Men should inform their doctor if they wish to father a child. This medicine may lower sperm counts. Do not treat diarrhea with over the counter products. Contact your doctor if you have diarrhea that lasts more than 2 days or if it is  severe and watery. This medicine can make you more sensitive to the sun. Keep out of the sun. If you cannot avoid being in the sun, wear protective clothing and use sunscreen. Do not use sun lamps or tanning beds/booths. What side effects may I notice from receiving this medication? Side effects that you should report to your doctor or health care professional as soon as possible: allergic reactions like skin rash, itching or hives, swelling of the face, lips, or tongue low blood counts - this medicine may decrease the number of white blood cells, red blood cells and platelets. You may be at increased risk for infections and bleeding. signs of infection - fever or chills, cough, sore throat, pain or difficulty passing urine signs of decreased platelets or bleeding - bruising, pinpoint red spots on the skin, black, tarry stools, blood in the urine signs of decreased red blood cells -  unusually weak or tired, fainting spells, lightheadedness breathing problems changes in vision chest pain mouth sores nausea and vomiting pain, swelling, redness at site where injected pain, tingling, numbness in the hands or feet redness, swelling, or sores on hands or feet stomach pain unusual bleeding Side effects that usually do not require medical attention (report to your doctor or health care professional if they continue or are bothersome): changes in finger or toe nails diarrhea dry or itchy skin hair loss headache loss of appetite sensitivity of eyes to the light stomach upset unusually teary eyes This list may not describe all possible side effects. Call your doctor for medical advice about side effects. You may report side effects to FDA at 1-800-FDA-1088. Where should I keep my medication? This drug is given in a hospital or clinic and will not be stored at home. NOTE: This sheet is a summary. It may not cover all possible information. If you have questions about this medicine, talk to your doctor, pharmacist, or health care provider.  2022 Elsevier/Gold Standard (2019-10-17 15:00:03)  Bevacizumab injection What is this medication? BEVACIZUMAB (be va SIZ yoo mab) is a monoclonal antibody. It is used to treat many types of cancer. This medicine may be used for other purposes; ask your health care provider or pharmacist if you have questions. COMMON BRAND NAME(S): Avastin, MVASI, Noah Charon What should I tell my care team before I take this medication? They need to know if you have any of these conditions: diabetes heart disease high blood pressure history of coughing up blood prior anthracycline chemotherapy (e.g., doxorubicin, daunorubicin, epirubicin) recent or ongoing radiation therapy recent or planning to have surgery stroke an unusual or allergic reaction to bevacizumab, hamster proteins, mouse proteins, other medicines, foods, dyes, or  preservatives pregnant or trying to get pregnant breast-feeding How should I use this medication? This medicine is for infusion into a vein. It is given by a health care professional in a hospital or clinic setting. Talk to your pediatrician regarding the use of this medicine in children. Special care may be needed. Overdosage: If you think you have taken too much of this medicine contact a poison control center or emergency room at once. NOTE: This medicine is only for you. Do not share this medicine with others. What if I miss a dose? It is important not to miss your dose. Call your doctor or health care professional if you are unable to keep an appointment. What may interact with this medication? Interactions are not expected. This list may not describe all possible interactions. Give your health care provider a list  of all the medicines, herbs, non-prescription drugs, or dietary supplements you use. Also tell them if you smoke, drink alcohol, or use illegal drugs. Some items may interact with your medicine. What should I watch for while using this medication? Your condition will be monitored carefully while you are receiving this medicine. You will need important blood work and urine testing done while you are taking this medicine. This medicine may increase your risk to bruise or bleed. Call your doctor or health care professional if you notice any unusual bleeding. Before having surgery, talk to your health care provider to make sure it is ok. This drug can increase the risk of poor healing of your surgical site or wound. You will need to stop this drug for 28 days before surgery. After surgery, wait at least 28 days before restarting this drug. Make sure the surgical site or wound is healed enough before restarting this drug. Talk to your health care provider if questions. Do not become pregnant while taking this medicine or for 6 months after stopping it. Women should inform their doctor if  they wish to become pregnant or think they might be pregnant. There is a potential for serious side effects to an unborn child. Talk to your health care professional or pharmacist for more information. Do not breast-feed an infant while taking this medicine and for 6 months after the last dose. This medicine has caused ovarian failure in some women. This medicine may interfere with the ability to have a child. You should talk to your doctor or health care professional if you are concerned about your fertility. What side effects may I notice from receiving this medication? Side effects that you should report to your doctor or health care professional as soon as possible: allergic reactions like skin rash, itching or hives, swelling of the face, lips, or tongue chest pain or chest tightness chills coughing up blood high fever seizures severe constipation signs and symptoms of bleeding such as bloody or black, tarry stools; red or dark-brown urine; spitting up blood or brown material that looks like coffee grounds; red spots on the skin; unusual bruising or bleeding from the eye, gums, or nose signs and symptoms of a blood clot such as breathing problems; chest pain; severe, sudden headache; pain, swelling, warmth in the leg signs and symptoms of a stroke like changes in vision; confusion; trouble speaking or understanding; severe headaches; sudden numbness or weakness of the face, arm or leg; trouble walking; dizziness; loss of balance or coordination stomach pain sweating swelling of legs or ankles vomiting weight gain Side effects that usually do not require medical attention (report to your doctor or health care professional if they continue or are bothersome): back pain changes in taste decreased appetite dry skin nausea tiredness This list may not describe all possible side effects. Call your doctor for medical advice about side effects. You may report side effects to FDA at  1-800-FDA-1088. Where should I keep my medication? This drug is given in a hospital or clinic and will not be stored at home. NOTE: This sheet is a summary. It may not cover all possible information. If you have questions about this medicine, talk to your doctor, pharmacist, or health care provider.  2022 Elsevier/Gold Standard (2019-09-13 10:50:46)

## 2021-08-20 LAB — CEA: CEA: 282 ng/mL — ABNORMAL HIGH (ref 0.0–4.7)

## 2021-08-21 ENCOUNTER — Ambulatory Visit
Admission: RE | Admit: 2021-08-21 | Discharge: 2021-08-21 | Disposition: A | Discharge status: Home / Self Care | Payer: BC Managed Care – PPO | Source: Ambulatory Visit | Attending: Oncology | Admitting: Oncology

## 2021-08-21 ENCOUNTER — Inpatient Hospital Stay: Payer: BC Managed Care – PPO

## 2021-08-21 ENCOUNTER — Other Ambulatory Visit: Payer: Self-pay

## 2021-08-21 VITALS — BP 112/80 | HR 68 | Temp 97.3°F | Resp 17

## 2021-08-21 DIAGNOSIS — C189 Malignant neoplasm of colon, unspecified: Secondary | ICD-10-CM | POA: Insufficient documentation

## 2021-08-21 DIAGNOSIS — Z5112 Encounter for antineoplastic immunotherapy: Secondary | ICD-10-CM | POA: Diagnosis not present

## 2021-08-21 MED ORDER — IOHEXOL 350 MG/ML SOLN
75.0000 mL | Freq: Once | INTRAVENOUS | Status: AC | PRN
  Administered 2021-08-21: 75 mL via INTRAVENOUS

## 2021-08-21 MED ORDER — HEPARIN SOD (PORK) LOCK FLUSH 100 UNIT/ML IV SOLN
500.0000 [IU] | Freq: Once | INTRAVENOUS | Status: AC | PRN
  Administered 2021-08-21: 500 [IU]
  Filled 2021-08-21: qty 5

## 2021-08-21 MED ORDER — SODIUM CHLORIDE 0.9% FLUSH
10.0000 mL | INTRAVENOUS | Status: DC | PRN
  Administered 2021-08-21: 10 mL
  Filled 2021-08-21: qty 10

## 2021-09-09 ENCOUNTER — Inpatient Hospital Stay: Payer: BC Managed Care – PPO

## 2021-09-09 ENCOUNTER — Inpatient Hospital Stay: Payer: BC Managed Care – PPO | Admitting: Oncology

## 2021-09-11 ENCOUNTER — Inpatient Hospital Stay: Payer: BC Managed Care – PPO

## 2021-09-16 ENCOUNTER — Inpatient Hospital Stay: Payer: BC Managed Care – PPO

## 2021-09-16 ENCOUNTER — Encounter: Payer: Self-pay | Admitting: Oncology

## 2021-09-16 ENCOUNTER — Other Ambulatory Visit: Payer: Self-pay

## 2021-09-16 ENCOUNTER — Inpatient Hospital Stay: Payer: BC Managed Care – PPO | Attending: Oncology

## 2021-09-16 ENCOUNTER — Inpatient Hospital Stay (HOSPITAL_BASED_OUTPATIENT_CLINIC_OR_DEPARTMENT_OTHER): Payer: BC Managed Care – PPO | Admitting: Oncology

## 2021-09-16 VITALS — BP 122/78 | HR 92 | Temp 98.4°F | Resp 16 | Wt 126.4 lb

## 2021-09-16 DIAGNOSIS — C786 Secondary malignant neoplasm of retroperitoneum and peritoneum: Secondary | ICD-10-CM | POA: Diagnosis not present

## 2021-09-16 DIAGNOSIS — C20 Malignant neoplasm of rectum: Secondary | ICD-10-CM | POA: Diagnosis not present

## 2021-09-16 DIAGNOSIS — C187 Malignant neoplasm of sigmoid colon: Secondary | ICD-10-CM | POA: Diagnosis present

## 2021-09-16 DIAGNOSIS — C189 Malignant neoplasm of colon, unspecified: Secondary | ICD-10-CM

## 2021-09-16 DIAGNOSIS — D638 Anemia in other chronic diseases classified elsewhere: Secondary | ICD-10-CM | POA: Diagnosis not present

## 2021-09-16 DIAGNOSIS — C787 Secondary malignant neoplasm of liver and intrahepatic bile duct: Secondary | ICD-10-CM | POA: Insufficient documentation

## 2021-09-16 DIAGNOSIS — Z5111 Encounter for antineoplastic chemotherapy: Secondary | ICD-10-CM

## 2021-09-16 DIAGNOSIS — Z5112 Encounter for antineoplastic immunotherapy: Secondary | ICD-10-CM | POA: Diagnosis not present

## 2021-09-16 DIAGNOSIS — Z452 Encounter for adjustment and management of vascular access device: Secondary | ICD-10-CM | POA: Diagnosis not present

## 2021-09-16 LAB — CBC WITH DIFFERENTIAL/PLATELET
Abs Immature Granulocytes: 0.02 10*3/uL (ref 0.00–0.07)
Basophils Absolute: 0 10*3/uL (ref 0.0–0.1)
Basophils Relative: 0 %
Eosinophils Absolute: 0.1 10*3/uL (ref 0.0–0.5)
Eosinophils Relative: 1 %
HCT: 33.6 % — ABNORMAL LOW (ref 36.0–46.0)
Hemoglobin: 10.8 g/dL — ABNORMAL LOW (ref 12.0–15.0)
Immature Granulocytes: 0 %
Lymphocytes Relative: 13 %
Lymphs Abs: 1 10*3/uL (ref 0.7–4.0)
MCH: 29.4 pg (ref 26.0–34.0)
MCHC: 32.1 g/dL (ref 30.0–36.0)
MCV: 91.6 fL (ref 80.0–100.0)
Monocytes Absolute: 0.8 10*3/uL (ref 0.1–1.0)
Monocytes Relative: 11 %
Neutro Abs: 5.3 10*3/uL (ref 1.7–7.7)
Neutrophils Relative %: 75 %
Platelets: 201 10*3/uL (ref 150–400)
RBC: 3.67 MIL/uL — ABNORMAL LOW (ref 3.87–5.11)
RDW: 13.7 % (ref 11.5–15.5)
WBC: 7.2 10*3/uL (ref 4.0–10.5)
nRBC: 0 % (ref 0.0–0.2)

## 2021-09-16 LAB — COMPREHENSIVE METABOLIC PANEL
ALT: 9 U/L (ref 0–44)
AST: 17 U/L (ref 15–41)
Albumin: 3.2 g/dL — ABNORMAL LOW (ref 3.5–5.0)
Alkaline Phosphatase: 84 U/L (ref 38–126)
Anion gap: 5 (ref 5–15)
BUN: 9 mg/dL (ref 8–23)
CO2: 25 mmol/L (ref 22–32)
Calcium: 9.2 mg/dL (ref 8.9–10.3)
Chloride: 103 mmol/L (ref 98–111)
Creatinine, Ser: 0.67 mg/dL (ref 0.44–1.00)
GFR, Estimated: >60 mL/min (ref >60)
Glucose, Bld: 99 mg/dL (ref 70–99)
Potassium: 3.8 mmol/L (ref 3.5–5.1)
Sodium: 133 mmol/L — ABNORMAL LOW (ref 135–145)
Total Bilirubin: 0.7 mg/dL (ref 0.3–1.2)
Total Protein: 8.4 g/dL — ABNORMAL HIGH (ref 6.5–8.1)

## 2021-09-16 MED ORDER — FLUOROURACIL CHEMO INJECTION 2.5 GM/50ML
400.0000 mg/m2 | Freq: Once | INTRAVENOUS | Status: AC
  Administered 2021-09-16: 700 mg via INTRAVENOUS
  Filled 2021-09-16: qty 14

## 2021-09-16 MED ORDER — LEUCOVORIN CALCIUM INJECTION 350 MG
700.0000 mg | Freq: Once | INTRAVENOUS | Status: AC
  Administered 2021-09-16: 700 mg via INTRAVENOUS
  Filled 2021-09-16: qty 35

## 2021-09-16 MED ORDER — SODIUM CHLORIDE 0.9 % IV SOLN
10.0000 mg | Freq: Once | INTRAVENOUS | Status: AC
  Administered 2021-09-16: 10 mg via INTRAVENOUS
  Filled 2021-09-16: qty 10

## 2021-09-16 MED ORDER — SODIUM CHLORIDE 0.9 % IV SOLN
Freq: Once | INTRAVENOUS | Status: AC
  Filled 2021-09-16: qty 250

## 2021-09-16 MED ORDER — FLUOROURACIL CHEMO INJECTION 5 GM/100ML
2400.0000 mg/m2 | INTRAVENOUS | Status: DC
  Administered 2021-09-16: 4100 mg via INTRAVENOUS
  Filled 2021-09-16: qty 82

## 2021-09-16 MED ORDER — DEXTROSE 5 % IV SOLN
Freq: Once | INTRAVENOUS | Status: AC
  Filled 2021-09-16: qty 250

## 2021-09-16 MED ORDER — SODIUM CHLORIDE 0.9% FLUSH
10.0000 mL | Freq: Once | INTRAVENOUS | Status: AC
  Administered 2021-09-16: 10 mL via INTRAVENOUS
  Filled 2021-09-16: qty 10

## 2021-09-16 MED ORDER — HEPARIN SOD (PORK) LOCK FLUSH 100 UNIT/ML IV SOLN
500.0000 [IU] | Freq: Once | INTRAVENOUS | Status: DC
  Filled 2021-09-16: qty 5

## 2021-09-16 MED ORDER — LIDOCAINE-PRILOCAINE 2.5-2.5 % EX CREA: 1.0000 "application " | TOPICAL_CREAM | CUTANEOUS | 0 refills | Status: DC | PRN

## 2021-09-16 MED ORDER — OXALIPLATIN CHEMO INJECTION 100 MG/20ML
65.0000 mg/m2 | Freq: Once | INTRAVENOUS | Status: AC
  Administered 2021-09-16: 110 mg via INTRAVENOUS
  Filled 2021-09-16: qty 20

## 2021-09-16 MED ORDER — SODIUM CHLORIDE 0.9 % IV SOLN
5.0000 mg/kg | Freq: Once | INTRAVENOUS | Status: AC
  Administered 2021-09-16: 300 mg via INTRAVENOUS
  Filled 2021-09-16: qty 12

## 2021-09-16 MED ORDER — PALONOSETRON HCL INJECTION 0.25 MG/5ML
0.2500 mg | Freq: Once | INTRAVENOUS | Status: AC
  Administered 2021-09-16: 0.25 mg via INTRAVENOUS
  Filled 2021-09-16: qty 5

## 2021-09-16 NOTE — Progress Notes (Signed)
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  She received IV Venofer for that.  I was concerned about malignancy given the degree of anemia and therefore obtain CT abdomen and pelvis with contrast CT showed 6.4 cm enhancing mass in the right liver and another mass measuring 4 cm.  Large volume stool in the right colon transverse and descending colon with irregular mass lesion in the distal sigmoid colon extending into the rectosigmoid junction with lateral extracolonic extension into the pelvic sidewall.  Apparent luminal narrowing at the level of the lesion which accounts for large volume of stool proximally.  Multiple pericolonic and perirectal lymph nodes.  Multiple peritoneal nodules are seen in the lower right pelvic sidewall.  Colon mass appears to be invading posterior uterus.Thrombosis of the superior rectal vein may be bland thrombus although direct tumor invasion not excluded.   Patient had a flexible sigmoidoscopy which showed a large obstructing mass in the rectosigmoid colon.  Scope could not be traversed beyond the obstruction.  Biopsies were taken and were consistent with adenocarcinoma. Patient had a diverting colostomy on 05/01/2021 with Dr. Dahlia Byes   Patient started palliative FOLFOX chemotherapy in June 2022 with plans to add Avastin after 6 cycles of treatment      Interval history-patient states that she forgot to  keep up with her appointment 2 weeks ago.  Presently reports doing well.  Appetite is good although she has lost 6 pounds over the last 1 month.  No abdominal pain and colostomy is functioning well  ECOG PS- 1 Pain scale- 0 Opioid associated constipation- no  Review of systems- Review of Systems  Constitutional:  Positive for malaise/fatigue. Negative for chills, fever and weight loss.  HENT:  Negative for congestion, ear discharge and nosebleeds.   Eyes:  Negative for blurred vision.  Respiratory:  Negative for cough, hemoptysis, sputum production, shortness of breath and wheezing.   Cardiovascular:  Negative for chest pain, palpitations, orthopnea and claudication.  Gastrointestinal:  Negative for abdominal pain, blood in stool, constipation, diarrhea, heartburn, melena, nausea and vomiting.  Genitourinary:  Negative for dysuria, flank pain, frequency, hematuria and urgency.  Musculoskeletal:  Negative for back pain, joint pain and myalgias.  Skin:  Negative for rash.  Neurological:  Negative for dizziness, tingling, focal weakness, seizures, weakness and headaches.  Endo/Heme/Allergies:  Does not bruise/bleed easily.  Psychiatric/Behavioral:  Negative for depression and suicidal ideas. The patient does not have insomnia.       No Known Allergies   Past Medical History:  Diagnosis Date   Allergy    Anemia      Past Surgical History:  Procedure Laterality Date   FLEXIBLE SIGMOIDOSCOPY N/A 04/24/2021   Procedure: FLEXIBLE SIGMOIDOSCOPY;  Surgeon: [name], MD;  Location: [facility] ENDOSCOPY;  Service: Gastroenterology;  Laterality: N/A;   PORTACATH PLACEMENT N/A 05/01/2021   Procedure: INSERTION PORT-A-CATH;  Surgeon: Jules Husbands, MD;  Location: [facility] ORS;  Service: General;  Laterality: N/A;    Social History  Socioeconomic History   Marital status: Married    Spouse name: Not on file   Number of children: Not on file   Years of education: Not on file   Highest education  level: Not on file  Occupational History   Not on file  Tobacco Use   Smoking status: Never   Smokeless tobacco: Never  Vaping Use   Vaping Use: Never used  Substance and Sexual Activity   Alcohol use: Not Currently   Drug use: Never   Sexual activity: Not Currently  Other Topics Concern   Not on file  Social History Narrative   Not on file   Social Determinants of Health   Financial Resource Strain: Not on file  Food Insecurity: Not on file  Transportation Needs: Not on file  Physical Activity: Not on file  Stress: Not on file  Social Connections: Not on file  Intimate Partner Violence: Not on file    Family History  Problem Relation Age of Onset   Stroke Mother    Hypertension Mother    Cancer Father      Current Outpatient Medications:    dexamethasone (DECADRON) 4 MG tablet, Take 2 tablets (8 mg total) by mouth daily. Start the day after chemotherapy for 2 days. Take with food., Disp: 30 tablet, Rfl: 1   folic acid (FOLVITE) 1 MG tablet, Take 1 tablet (1 mg total) by mouth daily. (Patient not taking: No sig reported), Disp: 30 tablet, Rfl: 3   ibuprofen (ADVIL) 600 MG tablet, Take 1 tablet (600 mg total) by mouth every 6 (six) hours as needed., Disp: 30 tablet, Rfl: 0   lidocaine-prilocaine (EMLA) cream, Apply to affected area once, Disp: 30 g, Rfl: 3   ondansetron (ZOFRAN) 8 MG tablet, Take 8 mg by mouth every 8 (eight) hours as needed for nausea or vomiting. (Patient not taking: Reported on 08/19/2021), Disp: , Rfl:    potassium chloride SA (KLOR-CON) 20 MEQ tablet, Take 1 tablet (20 mEq total) by mouth daily. (Patient not taking: Reported on 08/19/2021), Disp: 10 tablet, Rfl: 0 No current facility-administered medications for this visit.  Facility-Administered Medications Ordered in Other Visits:    heparin lock flush 100 unit/mL, 500 Units, Intravenous, Once, Sindy Guadeloupe, MD   sodium chloride flush (NS) 0.9 % injection 10 mL, 10 mL, Intravenous, Once, Sindy Guadeloupe, MD  Physical exam:  Vitals:   09/16/21 0838  BP: 122/78  Pulse: 92  Resp: 16  Temp: 98.4 F (36.9 C)  SpO2: 100%  Weight: 126 lb 6.4 oz (57.3 kg)   Physical Exam Cardiovascular:     Rate and Rhythm: Normal rate and regular rhythm.     Heart sounds: Normal heart sounds.  Pulmonary:     Effort: Pulmonary effort is normal.     Breath sounds: Normal breath sounds.  Abdominal:     General: Bowel sounds are normal.     Palpations: Abdomen is soft.     Comments: Colostomy in place  Skin:    General: Skin is warm and dry.  Neurological:     Mental Status: She is alert and oriented to person, place, and time.     CMP Latest Ref Rng & Units 08/19/2021  Glucose 70 - 99 mg/dL 78  BUN 8 - 23 mg/dL 8  Creatinine 0.44 - 1.00 mg/dL 0.64  Sodium 135 - 145 mmol/L 136  Potassium 3.5 - 5.1 mmol/L 3.3(L)  Chloride 98 - 111 mmol/L 105  CO2 22 -  32 mmol/L 24  Calcium 8.9 - 10.3 mg/dL 8.8(L)  Total Protein 6.5 - 8.1 g/dL 7.4  Total Bilirubin 0.3 - 1.2 mg/dL 0.6  Alkaline Phos 38 - 126 U/L 76  AST 15 - 41 U/L 27  ALT 0 - 44 U/L 15   CBC Latest Ref Rng & Units 08/19/2021  WBC 4.0 - 10.5 K/uL 7.2  Hemoglobin 12.0 - 15.0 g/dL 10.0(L)  Hematocrit 36.0 - 46.0 % 31.4(L)  Platelets 150 - 400 K/uL 151    No images are attached to the encounter.  CT CHEST ABDOMEN PELVIS W CONTRAST  Result Date: 08/23/2021 CLINICAL DATA:  Colorectal cancer restaging, assess treatment response, ongoing chemotherapy EXAM: CT CHEST, ABDOMEN, AND PELVIS WITH CONTRAST TECHNIQUE: Multidetector CT imaging of the chest, abdomen and pelvis was performed following the standard protocol during bolus administration of intravenous contrast. CONTRAST:  33mL OMNIPAQUE IOHEXOL 350 MG/ML SOLN, additional oral enteric contrast COMPARISON:  CT chest, 05/12/2021, CT chest abdomen pelvis, 04/22/2021 FINDINGS: CT CHEST FINDINGS Cardiovascular: Right chest port catheter. Normal heart size. No pericardial effusion.  Mediastinum/Nodes: No enlarged mediastinal, hilar, or axillary lymph nodes. Thyroid gland, trachea, and esophagus demonstrate no significant findings. Lungs/Pleura: Mild, bandlike scarring of the dependent bilateral lung bases. No pleural effusion or pneumothorax. Musculoskeletal: No chest wall mass or suspicious bone lesions identified. CT ABDOMEN PELVIS FINDINGS Hepatobiliary: Interval decrease in size of a mass of the posterior inferior right lobe of the liver, hepatic segment VI, measuring 5.1 x 5.1 cm, previously 6.6 x 6.4 cm when measured similarly (series 2, image 67). Small volume pericholecystic fluid, nonspecific in the setting of ascites. No gallstones, gallbladder wall thickening, or biliary dilatation. Pancreas: Unremarkable. No pancreatic ductal dilatation or surrounding inflammatory changes. Spleen: Normal in size without significant abnormality. Adrenals/Urinary Tract: Adrenal glands are unremarkable. Kidneys are normal, without renal calculi, solid lesion, or hydronephrosis. Bladder is unremarkable. Stomach/Bowel: Stomach is within normal limits. Appendix appears normal. Status post interval diverting loop descending colostomy. Large burden of stool within the colon proximal to this point. There is a redemonstrated, ulcerative and infiltrating mass of the sigmoid colon, overall dimensions approximately 4.5 x 4.4 cm, perhaps slightly diminished in overall size, previously 5.0 x 5.0 cm when measured similarly (series 2, image 100). There are numerous enlarged mesocolon lymph nodes and/or soft tissue nodules, similar to prior examination, measuring up to 1.2 x 1.1 cm (series 2, image 99). Perirectal and presacral fat stranding and thickening (series 2, image 108). Vascular/Lymphatic: No significant vascular findings are present. No enlarged abdominal or pelvic lymph nodes. Reproductive: No mass or other abnormality. Other: No abdominal wall hernia.  Trace perihepatic ascites. Musculoskeletal: No acute  or significant osseous findings. IMPRESSION: 1. There is a redemonstrated, ulcerative and infiltrating mass of the sigmoid colon, perhaps slightly diminished in size. There are numerous enlarged adjacent mesocolonic lymph nodes and/or soft tissue nodules, similar to prior examination. 2. Interval decrease in size of a mass of the posterior inferior right lobe of the liver. 3. Findings are consistent with treatment response of primary colon malignancy and metastatic disease. 4. No evidence of metastatic disease in the chest. 5. Status post interval diverting loop descending colostomy. Large burden of stool within the colon proximal to this point. Correlate for colostomy output. 6. Trace perihepatic ascites. Electronically Signed   By: Eddie Candle M.D.   On: 08/23/2021 11:18     Assessment and plan- Patient is a 65 y.o. female with metastatic adenocarcinoma of the sigmoid colon/rectum with peritoneal and  liver metastases.  Patient is here for on treatment assessment prior to cycle 8 of FOLFOX bevacizumab chemotherapy  Counts okay to proceed with cycle 8 of bevacizumab and FOLFOX chemotherapy today.  Blood pressure is stable and urine protein acceptable to proceed with bevacizumab today.  I have reviewed CT chest abdomen pelvis images from 08/21/2021 independently and discussed findings with the patient which shows overall decrease in the size of her liver metastases mild decrease in the size of sigmoid colon mass.  Similar size of adjacent mesocolonic lymph nodes.  No evidence of progressive disease.   Patient missed her treatment 2 weeks ago and will proceed with cycle 8 today with pump disconnect on day 3.  I will see her back in 2 weeks for cycle 9  Anemia of chronic disease: Stable continue to monitor   Visit Diagnosis 1. Encounter for antineoplastic chemotherapy   2. Metastatic colon cancer in female [facility])   3. Anemia of chronic disease      Dr. Randa Evens, MD, MPH [facility] [phone] 09/16/2021 8:29 AM

## 2021-09-16 NOTE — Progress Notes (Signed)
Pt states she has been experiecing a "burning sensation" in her anal area that is uncomfortable, sensation comes and goes.

## 2021-09-16 NOTE — Progress Notes (Signed)
Pt received folfox and zirabev infusion in clinic today. Tolerated well. Adrucil pump connected before d/c.

## 2021-09-16 NOTE — Progress Notes (Signed)
Per Dr Janese Banks, ok to proceed with zirabev infusion today prior to urine protein results

## 2021-09-16 NOTE — Patient Instructions (Addendum)
[facility]   Discharge Instructions: Thank you for choosing [facility] to provide your oncology and hematology care.  If you have a lab appointment with the [facility], please go directly to the [facility] and check in at the registration area.  Wear comfortable clothing and clothing appropriate for easy access to any Portacath or PICC line.   We strive to give you quality time with your provider. You may need to reschedule your appointment if you arrive late (15 or more minutes).  Arriving late affects you and other patients whose appointments are after yours.  Also, if you miss three or more appointments without notifying the office, you may be dismissed from the clinic at the provider's discretion.      For prescription refill requests, have your pharmacy contact our office and allow 72 hours for refills to be completed.    Today you received the following chemotherapy and/or immunotherapy agents - Zirabev, Oxaliplatin, Leucovorin, Adrucil   To help prevent nausea and vomiting after your treatment, we encourage you to take your nausea medication as directed.  BELOW ARE SYMPTOMS THAT SHOULD BE REPORTED IMMEDIATELY: *FEVER GREATER THAN 100.4 F (38 C) OR HIGHER *CHILLS OR SWEATING *NAUSEA AND VOMITING THAT IS NOT CONTROLLED WITH YOUR NAUSEA MEDICATION *UNUSUAL SHORTNESS OF BREATH *UNUSUAL BRUISING OR BLEEDING *URINARY PROBLEMS (pain or burning when urinating, or frequent urination) *BOWEL PROBLEMS (unusual diarrhea, constipation, pain near the anus) TENDERNESS IN MOUTH AND THROAT WITH OR WITHOUT PRESENCE OF ULCERS (sore throat, sores in mouth, or a toothache) UNUSUAL RASH, SWELLING OR PAIN  UNUSUAL VAGINAL DISCHARGE OR ITCHING   Items with * indicate a potential emergency and should be followed up as soon as possible or go to the Emergency Department if any problems should occur.  Please show the CHEMOTHERAPY ALERT CARD or  IMMUNOTHERAPY ALERT CARD at check-in to the Emergency Department and triage nurse.  Palonosetron Injection What is this medication? PALONOSETRON (pal oh NOE se tron) is used to prevent nausea and vomiting caused by chemotherapy. It also helps prevent delayed nausea and vomiting that may occur a few days after your treatment. This medicine may be used for other purposes; ask your health care provider or pharmacist if you have questions. COMMON BRAND NAME(S): Aloxi What should I tell my care team before I take this medication? They need to know if you have any of these conditions: an unusual or allergic reaction to palonosetron, dolasetron, granisetron, ondansetron, other medicines, foods, dyes, or preservatives pregnant or trying to get pregnant breast-feeding How should I use this medication? This medicine is for infusion into a vein. It is given by a health care professional in a hospital or clinic setting. Talk to your pediatrician regarding the use of this medicine in children. While this drug may be prescribed for children as young as 1 month for selected conditions, precautions do apply. Overdosage: If you think you have taken too much of this medicine contact a poison control center or emergency room at once. NOTE: This medicine is only for you. Do not share this medicine with others. What if I miss a dose? This does not apply. What may interact with this medication? certain medicines for depression, anxiety, or psychotic disturbances fentanyl linezolid MAOIs like Carbex, Eldepryl, Marplan, Nardil, and Parnate methylene blue (injected into a vein) tramadol This list may not describe all possible interactions. Give your health care provider a list of all the medicines, herbs, non-prescription drugs, or dietary  supplements you use. Also tell them if you smoke, drink alcohol, or use illegal drugs. Some items may interact with your medicine. What should I watch for while using this  medication? Your condition will be monitored carefully while you are receiving this medicine. What side effects may I notice from receiving this medication? Side effects that you should report to your doctor or health care professional as soon as possible: allergic reactions like skin rash, itching or hives, swelling of the face, lips, or tongue breathing problems confusion dizziness fast, irregular heartbeat fever and chills loss of balance or coordination seizures sweating swelling of the hands and feet tremors unusually weak or tired Side effects that usually do not require medical attention (report to your doctor or health care professional if they continue or are bothersome): constipation or diarrhea headache This list may not describe all possible side effects. Call your doctor for medical advice about side effects. You may report side effects to FDA at 1-800-FDA-1088. Where should I keep my medication? This drug is given in a hospital or clinic and will not be stored at home. NOTE: This sheet is a summary. It may not cover all possible information. If you have questions about this medicine, talk to your doctor, pharmacist, or health care provider.  2022 Elsevier/Gold Standard (2013-09-22 10:38:36)   Should you have questions after your visit or need to cancel or reschedule your appointment, please contact [facility]  [phone] and follow the prompts.  Office hours are 8:00 a.m. to 4:30 p.m. Monday - Friday. Please note that voicemails left after 4:00 p.m. may not be returned until the following business day.  We are closed weekends and major holidays. You have access to a nurse at all times for urgent questions. Please call the main number to the clinic [phone] and follow the prompts.  For any non-urgent questions, you may also contact your provider using MyChart. We now offer e-Visits for anyone 23 and older to request care online for  non-urgent symptoms. For details visit [email or web address].   Also download the MyChart app! Go to the app store, search "MyChart", open the app, select [facility], and log in with your MyChart username and password.  Due to Covid, a mask is required upon entering the hospital/clinic. If you do not have a mask, one will be given to you upon arrival. For doctor visits, patients may have 1 support person aged 51 or older with them. For treatment visits, patients cannot have anyone with them due to current Covid guidelines and our immunocompromised population.   Dexamethasone injection What is this medication? DEXAMETHASONE (dex a METH a sone) is a corticosteroid. It is used to treat inflammation of the skin, joints, lungs, and other organs. Common conditions treated include asthma, allergies, and arthritis. It is also used for other conditions, like blood disorders and diseases of the adrenal glands. This medicine may be used for other purposes; ask your health care provider or pharmacist if you have questions. COMMON BRAND NAME(S): Decadron, DoubleDex, ReadySharp Dexamethasone, Simplist Dexamethasone, Solurex What should I tell my care team before I take this medication? They need to know if you have any of these conditions: Cushing's syndrome diabetes glaucoma heart disease high blood pressure infection like herpes, measles, tuberculosis, or chickenpox kidney disease liver disease mental illness myasthenia gravis osteoporosis previous heart attack seizures stomach or intestine problems thyroid disease an unusual or allergic reaction to dexamethasone, corticosteroids, other medicines, lactose, foods, dyes, or preservatives pregnant or  trying to get pregnant breast-feeding How should I use this medication? This medicine is for injection into a muscle, joint, lesion, soft tissue, or vein. It is given by a health care professional in a hospital or clinic setting. Talk to your  pediatrician regarding the use of this medicine in children. Special care may be needed. Overdosage: If you think you have taken too much of this medicine contact a poison control center or emergency room at once. NOTE: This medicine is only for you. Do not share this medicine with others. What if I miss a dose? This may not apply. If you are having a series of injections over a prolonged period, try not to miss an appointment. Call your doctor or health care professional to reschedule if you are unable to keep an appointment. What may interact with this medication? Do not take this medicine with any of the following medications: live virus vaccines This medicine may also interact with the following medications: aminoglutethimide amphotericin B aspirin and aspirin-like medicines certain antibiotics like erythromycin, clarithromycin, and troleandomycin certain antivirals for HIV or hepatitis certain medicines for seizures like carbamazepine, phenobarbital, phenytoin certain medicines to treat myasthenia gravis cholestyramine cyclosporine digoxin diuretics ephedrine female hormones, like estrogen or progestins and birth control pills insulin or other medicines for diabetes isoniazid ketoconazole medicines that relax muscles for surgery mifepristone NSAIDs, medicines for pain and inflammation, like ibuprofen or naproxen rifampin skin tests for allergies thalidomide vaccines warfarin This list may not describe all possible interactions. Give your health care provider a list of all the medicines, herbs, non-prescription drugs, or dietary supplements you use. Also tell them if you smoke, drink alcohol, or use illegal drugs. Some items may interact with your medicine. What should I watch for while using this medication? Visit your health care professional for regular checks on your progress. Tell your health care professional if your symptoms do not start to get better or if they get  worse. Your condition will be monitored carefully while you are receiving this medicine. Wear a medical ID bracelet or chain. Carry a card that describes your disease and details of your medicine and dosage times. This medicine may increase your risk of getting an infection. Call your health care professional for advice if you get a fever, chills, or sore throat, or other symptoms of a cold or flu. Do not treat yourself. Try to avoid being around people who are sick. Call your health care professional if you are around anyone with measles, chickenpox, or if you develop sores or blisters that do not heal properly. If you are going to need surgery or other procedures, tell your doctor or health care professional that you have taken this medicine within the last 12 months. Ask your doctor or health care professional about your diet. You may need to lower the amount of salt you eat. This medicine may increase blood sugar. Ask your healthcare provider if changes in diet or medicines are needed if you have diabetes. What side effects may I notice from receiving this medication? Side effects that you should report to your doctor or health care professional as soon as possible: allergic reactions like skin rash, itching or hives, swelling of the face, lips, or tongue bloody or black, tarry stools changes in emotions or moods changes in vision confusion, excitement, restlessness depressed mood eye pain hallucinations muscle weakness severe or sudden stomach or belly pain signs and symptoms of high blood sugar such as being more thirsty or hungry or having  to urinate more than normal. You may also feel very tired or have blurry vision. signs and symptoms of infection like fever; chills; cough; sore throat; pain or trouble passing urine swelling of ankles, feet unusual bruising or bleeding wounds that do not heal Side effects that usually do not require medical attention (report to your doctor or health  care professional if they continue or are bothersome): increased appetite increased growth of face or body hair headache nausea, vomiting pain, redness, or irritation at site where injected skin problems, acne, thin and shiny skin trouble sleeping weight gain This list may not describe all possible side effects. Call your doctor for medical advice about side effects. You may report side effects to FDA at 1-800-FDA-1088. Where should I keep my medication? This medicine is given in a hospital or clinic and will not be stored at home. NOTE: This sheet is a summary. It may not cover all possible information. If you have questions about this medicine, talk to your doctor, pharmacist, or health care provider.  2022 Elsevier/Gold Standard (2019-05-30 13:51:58)  Bevacizumab injection What is this medication? BEVACIZUMAB (be va SIZ yoo mab) is a monoclonal antibody. It is used to treat many types of cancer. This medicine may be used for other purposes; ask your health care provider or pharmacist if you have questions. COMMON BRAND NAME(S): Avastin, MVASI, Noah Charon What should I tell my care team before I take this medication? They need to know if you have any of these conditions: diabetes heart disease high blood pressure history of coughing up blood prior anthracycline chemotherapy (e.g., doxorubicin, daunorubicin, epirubicin) recent or ongoing radiation therapy recent or planning to have surgery stroke an unusual or allergic reaction to bevacizumab, hamster proteins, mouse proteins, other medicines, foods, dyes, or preservatives pregnant or trying to get pregnant breast-feeding How should I use this medication? This medicine is for infusion into a vein. It is given by a health care professional in a hospital or clinic setting. Talk to your pediatrician regarding the use of this medicine in children. Special care may be needed. Overdosage: If you think you have taken too much of this  medicine contact a poison control center or emergency room at once. NOTE: This medicine is only for you. Do not share this medicine with others. What if I miss a dose? It is important not to miss your dose. Call your doctor or health care professional if you are unable to keep an appointment. What may interact with this medication? Interactions are not expected. This list may not describe all possible interactions. Give your health care provider a list of all the medicines, herbs, non-prescription drugs, or dietary supplements you use. Also tell them if you smoke, drink alcohol, or use illegal drugs. Some items may interact with your medicine. What should I watch for while using this medication? Your condition will be monitored carefully while you are receiving this medicine. You will need important blood work and urine testing done while you are taking this medicine. This medicine may increase your risk to bruise or bleed. Call your doctor or health care professional if you notice any unusual bleeding. Before having surgery, talk to your health care provider to make sure it is ok. This drug can increase the risk of poor healing of your surgical site or wound. You will need to stop this drug for 28 days before surgery. After surgery, wait at least 28 days before restarting this drug. Make sure the surgical site or wound is healed  enough before restarting this drug. Talk to your health care provider if questions. Do not become pregnant while taking this medicine or for 6 months after stopping it. Women should inform their doctor if they wish to become pregnant or think they might be pregnant. There is a potential for serious side effects to an unborn child. Talk to your health care professional or pharmacist for more information. Do not breast-feed an infant while taking this medicine and for 6 months after the last dose. This medicine has caused ovarian failure in some women. This medicine may interfere  with the ability to have a child. You should talk to your doctor or health care professional if you are concerned about your fertility. What side effects may I notice from receiving this medication? Side effects that you should report to your doctor or health care professional as soon as possible: allergic reactions like skin rash, itching or hives, swelling of the face, lips, or tongue chest pain or chest tightness chills coughing up blood high fever seizures severe constipation signs and symptoms of bleeding such as bloody or black, tarry stools; red or dark-brown urine; spitting up blood or brown material that looks like coffee grounds; red spots on the skin; unusual bruising or bleeding from the eye, gums, or nose signs and symptoms of a blood clot such as breathing problems; chest pain; severe, sudden headache; pain, swelling, warmth in the leg signs and symptoms of a stroke like changes in vision; confusion; trouble speaking or understanding; severe headaches; sudden numbness or weakness of the face, arm or leg; trouble walking; dizziness; loss of balance or coordination stomach pain sweating swelling of legs or ankles vomiting weight gain Side effects that usually do not require medical attention (report to your doctor or health care professional if they continue or are bothersome): back pain changes in taste decreased appetite dry skin nausea tiredness This list may not describe all possible side effects. Call your doctor for medical advice about side effects. You may report side effects to FDA at 1-800-FDA-1088. Where should I keep my medication? This drug is given in a hospital or clinic and will not be stored at home. NOTE: This sheet is a summary. It may not cover all possible information. If you have questions about this medicine, talk to your doctor, pharmacist, or health care provider.  2022 Elsevier/Gold Standard (2019-09-13 10:50:46)  Oxaliplatin Injection What is  this medication? OXALIPLATIN (ox AL i PLA tin) is a chemotherapy drug. It targets fast dividing cells, like cancer cells, and causes these cells to die. This medicine is used to treat cancers of the colon and rectum, and many other cancers. This medicine may be used for other purposes; ask your health care provider or pharmacist if you have questions. COMMON BRAND NAME(S): Eloxatin What should I tell my care team before I take this medication? They need to know if you have any of these conditions: heart disease history of irregular heartbeat liver disease low blood counts, like Emmitte Surgeon cells, platelets, or red blood cells lung or breathing disease, like asthma take medicines that treat or prevent blood clots tingling of the fingers or toes, or other nerve disorder an unusual or allergic reaction to oxaliplatin, other chemotherapy, other medicines, foods, dyes, or preservatives pregnant or trying to get pregnant breast-feeding How should I use this medication? This drug is given as an infusion into a vein. It is administered in a hospital or clinic by a specially trained health care professional. Talk to your  pediatrician regarding the use of this medicine in children. Special care may be needed. Overdosage: If you think you have taken too much of this medicine contact a poison control center or emergency room at once. NOTE: This medicine is only for you. Do not share this medicine with others. What if I miss a dose? It is important not to miss a dose. Call your doctor or health care professional if you are unable to keep an appointment. What may interact with this medication? Do not take this medicine with any of the following medications: cisapride dronedarone pimozide thioridazine This medicine may also interact with the following medications: aspirin and aspirin-like medicines certain medicines that treat or prevent blood clots like warfarin, apixaban, dabigatran, and  rivaroxaban cisplatin cyclosporine diuretics medicines for infection like acyclovir, adefovir, amphotericin B, bacitracin, cidofovir, foscarnet, ganciclovir, gentamicin, pentamidine, vancomycin NSAIDs, medicines for pain and inflammation, like ibuprofen or naproxen other medicines that prolong the QT interval (an abnormal heart rhythm) pamidronate zoledronic acid This list may not describe all possible interactions. Give your health care provider a list of all the medicines, herbs, non-prescription drugs, or dietary supplements you use. Also tell them if you smoke, drink alcohol, or use illegal drugs. Some items may interact with your medicine. What should I watch for while using this medication? Your condition will be monitored carefully while you are receiving this medicine. You may need blood work done while you are taking this medicine. This medicine may make you feel generally unwell. This is not uncommon as chemotherapy can affect healthy cells as well as cancer cells. Report any side effects. Continue your course of treatment even though you feel ill unless your healthcare professional tells you to stop. This medicine can make you more sensitive to cold. Do not drink cold drinks or use ice. Cover exposed skin before coming in contact with cold temperatures or cold objects. When out in cold weather wear warm clothing and cover your mouth and nose to warm the air that goes into your lungs. Tell your doctor if you get sensitive to the cold. Do not become pregnant while taking this medicine or for 9 months after stopping it. Women should inform their health care professional if they wish to become pregnant or think they might be pregnant. Men should not father a child while taking this medicine and for 6 months after stopping it. There is potential for serious side effects to an unborn child. Talk to your health care professional for more information. Do not breast-feed a child while taking this  medicine or for 3 months after stopping it. This medicine has caused ovarian failure in some women. This medicine may make it more difficult to get pregnant. Talk to your health care professional if you are concerned about your fertility. This medicine has caused decreased sperm counts in some men. This may make it more difficult to father a child. Talk to your health care professional if you are concerned about your fertility. This medicine may increase your risk of getting an infection. Call your health care professional for advice if you get a fever, chills, or sore throat, or other symptoms of a cold or flu. Do not treat yourself. Try to avoid being around people who are sick. Avoid taking medicines that contain aspirin, acetaminophen, ibuprofen, naproxen, or ketoprofen unless instructed by your health care professional. These medicines may hide a fever. Be careful brushing or flossing your teeth or using a toothpick because you may get an infection or bleed  more easily. If you have any dental work done, tell your dentist you are receiving this medicine. What side effects may I notice from receiving this medication? Side effects that you should report to your doctor or health care professional as soon as possible: allergic reactions like skin rash, itching or hives, swelling of the face, lips, or tongue breathing problems cough low blood counts - this medicine may decrease the number of Kyel Purk blood cells, red blood cells, and platelets. You may be at increased risk for infections and bleeding nausea, vomiting pain, redness, or irritation at site where injected pain, tingling, numbness in the hands or feet signs and symptoms of bleeding such as bloody or black, tarry stools; red or dark brown urine; spitting up blood or brown material that looks like coffee grounds; red spots on the skin; unusual bruising or bleeding from the eyes, gums, or nose signs and symptoms of a dangerous change in  heartbeat or heart rhythm like chest pain; dizziness; fast, irregular heartbeat; palpitations; feeling faint or lightheaded; falls signs and symptoms of infection like fever; chills; cough; sore throat; pain or trouble passing urine signs and symptoms of liver injury like dark yellow or brown urine; general ill feeling or flu-like symptoms; light-colored stools; loss of appetite; nausea; right upper belly pain; unusually weak or tired; yellowing of the eyes or skin signs and symptoms of low red blood cells or anemia such as unusually weak or tired; feeling faint or lightheaded; falls signs and symptoms of muscle injury like dark urine; trouble passing urine or change in the amount of urine; unusually weak or tired; muscle pain; back pain Side effects that usually do not require medical attention (report to your doctor or health care professional if they continue or are bothersome): changes in taste diarrhea gas hair loss loss of appetite mouth sores This list may not describe all possible side effects. Call your doctor for medical advice about side effects. You may report side effects to FDA at 1-800-FDA-1088. Where should I keep my medication? This drug is given in a hospital or clinic and will not be stored at home. NOTE: This sheet is a summary. It may not cover all possible information. If you have questions about this medicine, talk to your doctor, pharmacist, or health care provider.  2022 Elsevier/Gold Standard (2019-04-05 12:20:35)  Leucovorin injection What is this medication? LEUCOVORIN (loo koe VOR in) is used to prevent or treat the harmful effects of some medicines. This medicine is used to treat anemia caused by a low amount of folic acid in the body. It is also used with 5-fluorouracil (5-FU) to treat colon cancer. This medicine may be used for other purposes; ask your health care provider or pharmacist if you have questions. What should I tell my care team before I take this  medication? They need to know if you have any of these conditions: anemia from low levels of vitamin B-12 in the blood an unusual or allergic reaction to leucovorin, folic acid, other medicines, foods, dyes, or preservatives pregnant or trying to get pregnant breast-feeding How should I use this medication? This medicine is for injection into a muscle or into a vein. It is given by a health care professional in a hospital or clinic setting. Talk to your pediatrician regarding the use of this medicine in children. Special care may be needed. Overdosage: If you think you have taken too much of this medicine contact a poison control center or emergency room at once. NOTE: This  medicine is only for you. Do not share this medicine with others. What if I miss a dose? This does not apply. What may interact with this medication? capecitabine fluorouracil phenobarbital phenytoin primidone trimethoprim-sulfamethoxazole This list may not describe all possible interactions. Give your health care provider a list of all the medicines, herbs, non-prescription drugs, or dietary supplements you use. Also tell them if you smoke, drink alcohol, or use illegal drugs. Some items may interact with your medicine. What should I watch for while using this medication? Your condition will be monitored carefully while you are receiving this medicine. This medicine may increase the side effects of 5-fluorouracil, 5-FU. Tell your doctor or health care professional if you have diarrhea or mouth sores that do not get better or that get worse. What side effects may I notice from receiving this medication? Side effects that you should report to your doctor or health care professional as soon as possible: allergic reactions like skin rash, itching or hives, swelling of the face, lips, or tongue breathing problems fever, infection mouth sores unusual bleeding or bruising unusually weak or tired Side effects that usually  do not require medical attention (report to your doctor or health care professional if they continue or are bothersome): constipation or diarrhea loss of appetite nausea, vomiting This list may not describe all possible side effects. Call your doctor for medical advice about side effects. You may report side effects to FDA at 1-800-FDA-1088. Where should I keep my medication? This drug is given in a hospital or clinic and will not be stored at home. NOTE: This sheet is a summary. It may not cover all possible information. If you have questions about this medicine, talk to your doctor, pharmacist, or health care provider.  2022 Elsevier/Gold Standard (2008-05-22 16:50:29)  Fluorouracil, 5-FU injection What is this medication? FLUOROURACIL, 5-FU (flure oh YOOR a sil) is a chemotherapy drug. It slows the growth of cancer cells. This medicine is used to treat many types of cancer like breast cancer, colon or rectal cancer, pancreatic cancer, and stomach cancer. This medicine may be used for other purposes; ask your health care provider or pharmacist if you have questions. COMMON BRAND NAME(S): Adrucil What should I tell my care team before I take this medication? They need to know if you have any of these conditions: blood disorders dihydropyrimidine dehydrogenase (DPD) deficiency infection (especially a virus infection such as chickenpox, cold sores, or herpes) kidney disease liver disease malnourished, poor nutrition recent or ongoing radiation therapy an unusual or allergic reaction to fluorouracil, other chemotherapy, other medicines, foods, dyes, or preservatives pregnant or trying to get pregnant breast-feeding How should I use this medication? This drug is given as an infusion or injection into a vein. It is administered in a hospital or clinic by a specially trained health care professional. Talk to your pediatrician regarding the use of this medicine in children. Special care may  be needed. Overdosage: If you think you have taken too much of this medicine contact a poison control center or emergency room at once. NOTE: This medicine is only for you. Do not share this medicine with others. What if I miss a dose? It is important not to miss your dose. Call your doctor or health care professional if you are unable to keep an appointment. What may interact with this medication? Do not take this medicine with any of the following medications: live virus vaccines This medicine may also interact with the following medications: medicines that treat  or prevent blood clots like warfarin, enoxaparin, and dalteparin This list may not describe all possible interactions. Give your health care provider a list of all the medicines, herbs, non-prescription drugs, or dietary supplements you use. Also tell them if you smoke, drink alcohol, or use illegal drugs. Some items may interact with your medicine. What should I watch for while using this medication? Visit your doctor for checks on your progress. This drug may make you feel generally unwell. This is not uncommon, as chemotherapy can affect healthy cells as well as cancer cells. Report any side effects. Continue your course of treatment even though you feel ill unless your doctor tells you to stop. In some cases, you may be given additional medicines to help with side effects. Follow all directions for their use. Call your doctor or health care professional for advice if you get a fever, chills or sore throat, or other symptoms of a cold or flu. Do not treat yourself. This drug decreases your body's ability to fight infections. Try to avoid being around people who are sick. This medicine may increase your risk to bruise or bleed. Call your doctor or health care professional if you notice any unusual bleeding. Be careful brushing and flossing your teeth or using a toothpick because you may get an infection or bleed more easily. If you have  any dental work done, tell your dentist you are receiving this medicine. Avoid taking products that contain aspirin, acetaminophen, ibuprofen, naproxen, or ketoprofen unless instructed by your doctor. These medicines may hide a fever. Do not become pregnant while taking this medicine. Women should inform their doctor if they wish to become pregnant or think they might be pregnant. There is a potential for serious side effects to an unborn child. Talk to your health care professional or pharmacist for more information. Do not breast-feed an infant while taking this medicine. Men should inform their doctor if they wish to father a child. This medicine may lower sperm counts. Do not treat diarrhea with over the counter products. Contact your doctor if you have diarrhea that lasts more than 2 days or if it is severe and watery. This medicine can make you more sensitive to the sun. Keep out of the sun. If you cannot avoid being in the sun, wear protective clothing and use sunscreen. Do not use sun lamps or tanning beds/booths. What side effects may I notice from receiving this medication? Side effects that you should report to your doctor or health care professional as soon as possible: allergic reactions like skin rash, itching or hives, swelling of the face, lips, or tongue low blood counts - this medicine may decrease the number of Eileen Croswell blood cells, red blood cells and platelets. You may be at increased risk for infections and bleeding. signs of infection - fever or chills, cough, sore throat, pain or difficulty passing urine signs of decreased platelets or bleeding - bruising, pinpoint red spots on the skin, black, tarry stools, blood in the urine signs of decreased red blood cells - unusually weak or tired, fainting spells, lightheadedness breathing problems changes in vision chest pain mouth sores nausea and vomiting pain, swelling, redness at site where injected pain, tingling, numbness in the  hands or feet redness, swelling, or sores on hands or feet stomach pain unusual bleeding Side effects that usually do not require medical attention (report to your doctor or health care professional if they continue or are bothersome): changes in finger or toe nails diarrhea dry or  itchy skin hair loss headache loss of appetite sensitivity of eyes to the light stomach upset unusually teary eyes This list may not describe all possible side effects. Call your doctor for medical advice about side effects. You may report side effects to FDA at 1-800-FDA-1088. Where should I keep my medication? This drug is given in a hospital or clinic and will not be stored at home. NOTE: This sheet is a summary. It may not cover all possible information. If you have questions about this medicine, talk to your doctor, pharmacist, or health care provider.  2022 Elsevier/Gold Standard (2019-10-17 15:00:03)

## 2021-09-17 LAB — PROTEIN ELECTRO, RANDOM URINE
Albumin ELP, Urine: 20.8 %
Alpha-1-Globulin, U: 5.5 %
Alpha-2-Globulin, U: 18.2 %
Beta Globulin, U: 31.2 %
Gamma Globulin, U: 24.3 %
Total Protein, Urine: 27 mg/dL

## 2021-09-18 ENCOUNTER — Inpatient Hospital Stay: Payer: BC Managed Care – PPO

## 2021-09-18 ENCOUNTER — Other Ambulatory Visit: Payer: Self-pay

## 2021-09-18 VITALS — BP 120/82 | HR 78 | Temp 96.0°F | Resp 18

## 2021-09-18 DIAGNOSIS — C189 Malignant neoplasm of colon, unspecified: Secondary | ICD-10-CM

## 2021-09-18 DIAGNOSIS — Z5112 Encounter for antineoplastic immunotherapy: Secondary | ICD-10-CM | POA: Diagnosis not present

## 2021-09-18 MED ORDER — HEPARIN SOD (PORK) LOCK FLUSH 100 UNIT/ML IV SOLN
500.0000 [IU] | Freq: Once | INTRAVENOUS | Status: AC | PRN
  Administered 2021-09-18: 500 [IU]
  Filled 2021-09-18: qty 5

## 2021-09-18 MED ORDER — HEPARIN SOD (PORK) LOCK FLUSH 100 UNIT/ML IV SOLN
INTRAVENOUS | Status: AC
  Filled 2021-09-18: qty 5

## 2021-09-18 MED ORDER — SODIUM CHLORIDE 0.9% FLUSH
10.0000 mL | INTRAVENOUS | Status: DC | PRN
  Administered 2021-09-18: 10 mL
  Filled 2021-09-18: qty 10

## 2021-09-23 ENCOUNTER — Telehealth: Payer: Self-pay

## 2021-09-23 NOTE — Telephone Encounter (Signed)
Spoke to pt in regards to return to work limitations; informed her I will fax off paperwork as soon as it is signed and she should contact her employer in regards to returning to work. Recieved a fax confirmation.

## 2021-09-29 ENCOUNTER — Ambulatory Visit (INDEPENDENT_AMBULATORY_CARE_PROVIDER_SITE_OTHER): Payer: BC Managed Care – PPO | Admitting: Surgery

## 2021-09-29 ENCOUNTER — Encounter: Payer: Self-pay | Admitting: Surgery

## 2021-09-29 ENCOUNTER — Other Ambulatory Visit: Payer: Self-pay

## 2021-09-29 VITALS — BP 112/75 | HR 69 | Temp 98.2°F | Ht 66.0 in | Wt 130.2 lb

## 2021-09-29 DIAGNOSIS — C188 Malignant neoplasm of overlapping sites of colon: Secondary | ICD-10-CM | POA: Diagnosis not present

## 2021-09-29 DIAGNOSIS — C189 Malignant neoplasm of colon, unspecified: Secondary | ICD-10-CM

## 2021-09-29 NOTE — Progress Notes (Signed)
Outpatient Surgical Follow Up  09/29/2021  Alicia Coleman is an 65 y.o. female.   Chief Complaint  Patient presents with   Follow-up    Loop colostomy and port placement    HPI: Alicia Coleman is a 65 year old female well-known to me with history of metastatic colorectal cancer.  Invading to the uterus liver.  She is undergoing chemotherapy and better.  She did have a CT scan recently showing improvement in size of metastatic liver disease.  Slight diminishing size colorectal cancer. She endorses no pain no fevers no chills.  Colostomy is working well.Taking PO, no concerns at this time  Past Medical History:  Diagnosis Date   Allergy    Anemia     Past Surgical History:  Procedure Laterality Date   FLEXIBLE SIGMOIDOSCOPY N/A 04/24/2021   Procedure: FLEXIBLE SIGMOIDOSCOPY;  Surgeon: [name], MD;  Location: [facility] ENDOSCOPY;  Service: Gastroenterology;  Laterality: N/A;   PORTACATH PLACEMENT N/A 05/01/2021   Procedure: INSERTION PORT-A-CATH;  Surgeon: Jules Husbands, MD;  Location: [facility] ORS;  Service: General;  Laterality: N/A;    Family History  Problem Relation Age of Onset   Stroke Mother    Hypertension Mother    Cancer Father     Social History:  reports that she has never smoked. She has never used smokeless tobacco. She reports that she does not currently use alcohol. She reports that she does not use drugs.  Allergies: No Known Allergies  Medications reviewed.    ROS Full ROS performed and is otherwise negative other than what is stated in HPI   BP 112/75   Pulse 69   Temp 98.2 F (36.8 C) (Oral)   Ht 5\' 6"  (1.676 m)   Wt 130 lb 3.2 oz (59.1 kg)   LMP  (LMP Unknown)   SpO2 98%   BMI 21.01 kg/m   Physical Exam Vitals and nursing note reviewed. Exam conducted with a chaperone present.  Constitutional:      General: She is not in acute distress.    Appearance: Normal appearance. She is normal weight. She is not ill-appearing.  Eyes:     General: No scleral  icterus.       Right eye: No discharge.        Left eye: No discharge.  Cardiovascular:     Rate and Rhythm: Normal rate and regular rhythm.  Pulmonary:     Effort: Pulmonary effort is normal. No respiratory distress.     Breath sounds: Normal breath sounds. No stridor.  Abdominal:     General: Abdomen is flat. There is no distension.     Palpations: Abdomen is soft. There is no mass.     Tenderness: There is no abdominal tenderness. There is no guarding or rebound.     Hernia: No hernia is present.     Comments: Colostomy in place w/o issues  Musculoskeletal:     Cervical back: Normal range of motion and neck supple. No rigidity or tenderness.  Skin:    General: Skin is warm and dry.     Capillary Refill: Capillary refill takes less than 2 seconds.  Neurological:     General: No focal deficit present.     Mental Status: She is alert and oriented to person, place, and time.  Psychiatric:        Mood and Affect: Mood normal.        Behavior: Behavior normal.        Thought Content: Thought content normal.  Judgment: Judgment normal.      Assessment/Plan:  65 metastatic colorectal cancer status post colostomy.  She is doing well from prior colostomy complication related to the surgery.  Undergoing chemotherapy. Rtc Clinic in  a few months HAs a F/U w Dr. Jan[facility] Greater than 50% of the 30 minutes  visit was spent in counseling/coordination of care   Caroleen Hamman, MD [facility]

## 2021-09-29 NOTE — Patient Instructions (Signed)
You may return to work on 10/04/21 with out restrictions.  Follow-up with our office as needed.  Please call and ask to speak with a nurse if you develop questions or concerns.

## 2021-09-30 ENCOUNTER — Other Ambulatory Visit: Payer: Self-pay | Admitting: *Deleted

## 2021-09-30 ENCOUNTER — Inpatient Hospital Stay: Payer: BC Managed Care – PPO

## 2021-09-30 ENCOUNTER — Inpatient Hospital Stay: Payer: BC Managed Care – PPO | Attending: Oncology

## 2021-09-30 ENCOUNTER — Inpatient Hospital Stay (HOSPITAL_BASED_OUTPATIENT_CLINIC_OR_DEPARTMENT_OTHER): Payer: BC Managed Care – PPO | Admitting: Oncology

## 2021-09-30 ENCOUNTER — Encounter: Payer: Self-pay | Admitting: Oncology

## 2021-09-30 VITALS — BP 108/74 | HR 76 | Temp 98.0°F | Resp 18 | Wt 132.7 lb

## 2021-09-30 DIAGNOSIS — Z5112 Encounter for antineoplastic immunotherapy: Secondary | ICD-10-CM | POA: Insufficient documentation

## 2021-09-30 DIAGNOSIS — C787 Secondary malignant neoplasm of liver and intrahepatic bile duct: Secondary | ICD-10-CM | POA: Diagnosis not present

## 2021-09-30 DIAGNOSIS — C189 Malignant neoplasm of colon, unspecified: Secondary | ICD-10-CM

## 2021-09-30 DIAGNOSIS — C187 Malignant neoplasm of sigmoid colon: Secondary | ICD-10-CM | POA: Insufficient documentation

## 2021-09-30 DIAGNOSIS — R1110 Vomiting, unspecified: Secondary | ICD-10-CM | POA: Insufficient documentation

## 2021-09-30 DIAGNOSIS — C786 Secondary malignant neoplasm of retroperitoneum and peritoneum: Secondary | ICD-10-CM | POA: Insufficient documentation

## 2021-09-30 DIAGNOSIS — C20 Malignant neoplasm of rectum: Secondary | ICD-10-CM | POA: Diagnosis present

## 2021-09-30 DIAGNOSIS — Z5111 Encounter for antineoplastic chemotherapy: Secondary | ICD-10-CM | POA: Insufficient documentation

## 2021-09-30 DIAGNOSIS — Z452 Encounter for adjustment and management of vascular access device: Secondary | ICD-10-CM | POA: Insufficient documentation

## 2021-09-30 LAB — CBC WITH DIFFERENTIAL/PLATELET
Abs Immature Granulocytes: 0.01 10*3/uL (ref 0.00–0.07)
Basophils Absolute: 0 10*3/uL (ref 0.0–0.1)
Basophils Relative: 1 %
Eosinophils Absolute: 0.1 10*3/uL (ref 0.0–0.5)
Eosinophils Relative: 2 %
HCT: 31 % — ABNORMAL LOW (ref 36.0–46.0)
Hemoglobin: 10 g/dL — ABNORMAL LOW (ref 12.0–15.0)
Immature Granulocytes: 0 %
Lymphocytes Relative: 24 %
Lymphs Abs: 1 10*3/uL (ref 0.7–4.0)
MCH: 29.9 pg (ref 26.0–34.0)
MCHC: 32.3 g/dL (ref 30.0–36.0)
MCV: 92.8 fL (ref 80.0–100.0)
Monocytes Absolute: 0.5 10*3/uL (ref 0.1–1.0)
Monocytes Relative: 11 %
Neutro Abs: 2.7 10*3/uL (ref 1.7–7.7)
Neutrophils Relative %: 62 %
Platelets: 147 10*3/uL — ABNORMAL LOW (ref 150–400)
RBC: 3.34 MIL/uL — ABNORMAL LOW (ref 3.87–5.11)
RDW: 13.6 % (ref 11.5–15.5)
WBC: 4.3 10*3/uL (ref 4.0–10.5)
nRBC: 0 % (ref 0.0–0.2)

## 2021-09-30 LAB — PROTEIN, URINE, RANDOM: Total Protein, Urine: 6 mg/dL

## 2021-09-30 LAB — COMPREHENSIVE METABOLIC PANEL
ALT: 13 U/L (ref 0–44)
AST: 19 U/L (ref 15–41)
Albumin: 3 g/dL — ABNORMAL LOW (ref 3.5–5.0)
Alkaline Phosphatase: 81 U/L (ref 38–126)
Anion gap: 6 (ref 5–15)
BUN: 11 mg/dL (ref 8–23)
CO2: 27 mmol/L (ref 22–32)
Calcium: 8.9 mg/dL (ref 8.9–10.3)
Chloride: 104 mmol/L (ref 98–111)
Creatinine, Ser: 0.59 mg/dL (ref 0.44–1.00)
GFR, Estimated: >60 mL/min (ref >60)
Glucose, Bld: 88 mg/dL (ref 70–99)
Potassium: 3.5 mmol/L (ref 3.5–5.1)
Sodium: 137 mmol/L (ref 135–145)
Total Bilirubin: 0.5 mg/dL (ref 0.3–1.2)
Total Protein: 6.9 g/dL (ref 6.5–8.1)

## 2021-09-30 MED ORDER — LEUCOVORIN CALCIUM INJECTION 350 MG
700.0000 mg | Freq: Once | INTRAVENOUS | Status: AC
  Administered 2021-09-30: 700 mg via INTRAVENOUS
  Filled 2021-09-30: qty 35

## 2021-09-30 MED ORDER — FLUOROURACIL CHEMO INJECTION 2.5 GM/50ML
400.0000 mg/m2 | Freq: Once | INTRAVENOUS | Status: AC
  Administered 2021-09-30: 700 mg via INTRAVENOUS
  Filled 2021-09-30: qty 14

## 2021-09-30 MED ORDER — DEXTROSE 5 % IV SOLN
Freq: Once | INTRAVENOUS | Status: AC
  Filled 2021-09-30: qty 250

## 2021-09-30 MED ORDER — SODIUM CHLORIDE 0.9 % IV SOLN
2400.0000 mg/m2 | INTRAVENOUS | Status: DC
  Administered 2021-09-30: 4100 mg via INTRAVENOUS
  Filled 2021-09-30: qty 82

## 2021-09-30 MED ORDER — PALONOSETRON HCL INJECTION 0.25 MG/5ML
0.2500 mg | Freq: Once | INTRAVENOUS | Status: AC
Start: 2021-09-30 — End: 2021-09-30
  Administered 2021-09-30: 0.25 mg via INTRAVENOUS
  Filled 2021-09-30: qty 5

## 2021-09-30 MED ORDER — SODIUM CHLORIDE 0.9 % IV SOLN
5.0000 mg/kg | Freq: Once | INTRAVENOUS | Status: AC
  Administered 2021-09-30: 300 mg via INTRAVENOUS
  Filled 2021-09-30: qty 12

## 2021-09-30 MED ORDER — SODIUM CHLORIDE 0.9 % IV SOLN
Freq: Once | INTRAVENOUS | Status: AC
  Filled 2021-09-30: qty 250

## 2021-09-30 MED ORDER — OXALIPLATIN CHEMO INJECTION 100 MG/20ML
65.0000 mg/m2 | Freq: Once | INTRAVENOUS | Status: AC
  Administered 2021-09-30: 110 mg via INTRAVENOUS
  Filled 2021-09-30: qty 20

## 2021-09-30 MED ORDER — SODIUM CHLORIDE 0.9 % IV SOLN
10.0000 mg | Freq: Once | INTRAVENOUS | Status: AC
  Administered 2021-09-30: 10 mg via INTRAVENOUS
  Filled 2021-09-30: qty 10

## 2021-09-30 NOTE — Progress Notes (Signed)
Letter to rtn to work

## 2021-09-30 NOTE — Patient Instructions (Signed)
[facility]  Discharge Instructions: Thank you for choosing [facility] to provide your oncology and hematology care.  If you have a lab appointment with the Cancer Center, please go directly to the Cancer Center and check in at the registration area.  Wear comfortable clothing and clothing appropriate for easy access to any Portacath or PICC line.   We strive to give you quality time with your provider. You may need to reschedule your appointment if you arrive late (15 or more minutes).  Arriving late affects you and other patients whose appointments are after yours.  Also, if you miss three or more appointments without notifying the office, you may be dismissed from the clinic at the provider's discretion.      For prescription refill requests, have your pharmacy contact our office and allow 72 hours for refills to be completed.      To help prevent nausea and vomiting after your treatment, we encourage you to take your nausea medication as directed.  BELOW ARE SYMPTOMS THAT SHOULD BE REPORTED IMMEDIATELY: *FEVER GREATER THAN 100.4 F (38 C) OR HIGHER *CHILLS OR SWEATING *NAUSEA AND VOMITING THAT IS NOT CONTROLLED WITH YOUR NAUSEA MEDICATION *UNUSUAL SHORTNESS OF BREATH *UNUSUAL BRUISING OR BLEEDING *URINARY PROBLEMS (pain or burning when urinating, or frequent urination) *BOWEL PROBLEMS (unusual diarrhea, constipation, pain near the anus) TENDERNESS IN MOUTH AND THROAT WITH OR WITHOUT PRESENCE OF ULCERS (sore throat, sores in mouth, or a toothache) UNUSUAL RASH, SWELLING OR PAIN  UNUSUAL VAGINAL DISCHARGE OR ITCHING   Items with * indicate a potential emergency and should be followed up as soon as possible or go to the Emergency Department if any problems should occur.  Please show the CHEMOTHERAPY ALERT CARD or IMMUNOTHERAPY ALERT CARD at check-in to the Emergency Department and triage nurse.  Should you have questions after your  visit or need to cancel or reschedule your appointment, please contact [facility]  [phone] and follow the prompts.  Office hours are 8:00 a.m. to 4:30 p.m. Monday - Friday. Please note that voicemails left after 4:00 p.m. may not be returned until the following business day.  We are closed weekends and major holidays. You have access to a nurse at all times for urgent questions. Please call the main number to the clinic [phone] and follow the prompts.  For any non-urgent questions, you may also contact your provider using MyChart. We now offer e-Visits for anyone 18 and older to request care online for non-urgent symptoms. For details visit [email or web address].   Also download the MyChart app! Go to the app store, search "MyChart", open the app, select [facility], and log in with your MyChart username and password.  Due to Covid, a mask is required upon entering the hospital/clinic. If you do not have a mask, one will be given to you upon arrival. For doctor visits, patients may have 1 support person aged 18 or older with them. For treatment visits, patients cannot have anyone with them due to current Covid guidelines and our immunocompromised population.  

## 2021-09-30 NOTE — Progress Notes (Signed)
No blood return obtained prior to 5FU push. MD notified. Per Dr. Elroy Channel instructions, went ahead with 5FU push and chemotherapy pump.

## 2021-09-30 NOTE — Progress Notes (Signed)
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  She received IV Venofer for that.  I was concerned about malignancy given the degree of anemia and therefore obtain CT abdomen and pelvis with contrast CT showed 6.4 cm enhancing mass in the right liver and another mass measuring 4 cm.  Large volume stool in the right colon transverse and descending colon with irregular mass lesion in the distal sigmoid colon extending into the rectosigmoid junction with lateral extracolonic extension into the pelvic sidewall.  Apparent luminal narrowing at the level of the lesion which accounts for large volume of stool proximally.  Multiple pericolonic and perirectal lymph nodes.  Multiple peritoneal nodules are seen in the lower right pelvic sidewall.  Colon mass appears to be invading posterior uterus.Thrombosis of the superior rectal vein may be bland thrombus although direct tumor invasion not excluded.   Patient had a flexible sigmoidoscopy which showed a large obstructing mass in the rectosigmoid colon.  Scope could not be traversed beyond the obstruction.  Biopsies were taken and were consistent with adenocarcinoma. Patient had a diverting colostomy on 05/01/2021 with Dr. Dahlia Byes   Patient started palliative FOLFOX chemotherapy in June 2022 with plans to add Avastin after 6 cycles of treatment      Interval history-tolerating treatment well so far.   Denies any specific complaints at this time.  Denies any significant nausea or vomiting.  Colostomy is functioning well.  She would like to return to full-time job.  ECOG PS- 1 Pain scale- 0   Review of systems- Review of Systems  Constitutional:  Negative for chills, fever, malaise/fatigue and weight loss.  HENT:  Negative for congestion, ear discharge and nosebleeds.   Eyes:  Negative for blurred vision.  Respiratory:  Negative for cough, hemoptysis, sputum production, shortness of breath and wheezing.   Cardiovascular:  Negative for chest pain, palpitations, orthopnea and claudication.  Gastrointestinal:  Negative for abdominal pain, blood in stool, constipation, diarrhea, heartburn, melena, nausea and vomiting.  Genitourinary:  Negative for dysuria, flank pain, frequency, hematuria and urgency.  Musculoskeletal:  Negative for back pain, joint pain and myalgias.  Skin:  Negative for rash.  Neurological:  Negative for dizziness, tingling, focal weakness, seizures, weakness and headaches.  Endo/Heme/Allergies:  Does not bruise/bleed easily.  Psychiatric/Behavioral:  Negative for depression and suicidal ideas. The patient does not have insomnia.      No Known Allergies   Past Medical History:  Diagnosis Date   Allergy    Anemia      Past Surgical History:  Procedure Laterality Date   FLEXIBLE SIGMOIDOSCOPY N/A 04/24/2021   Procedure: FLEXIBLE SIGMOIDOSCOPY;  Surgeon: [name], MD;  Location: [facility] ENDOSCOPY;  Service: Gastroenterology;  Laterality: N/A;   PORTACATH PLACEMENT N/A 05/01/2021   Procedure: INSERTION PORT-A-CATH;  Surgeon: Jules Husbands, MD;  Location: [facility] ORS;  Service: General;  Laterality: N/A;    Social History   Socioeconomic History   Marital status: Married    Spouse name: Not on  file   Number of children: Not on file   Years of education: Not on file   Highest education level: Not on file  Occupational History   Not on file  Tobacco Use    Smoking status: Never   Smokeless tobacco: Never  Vaping Use   Vaping Use: Never used  Substance and Sexual Activity   Alcohol use: Not Currently   Drug use: Never   Sexual activity: Not Currently  Other Topics Concern   Not on file  Social History Narrative   Not on file   Social Determinants of Health   Financial Resource Strain: Not on file  Food Insecurity: Not on file  Transportation Needs: Not on file  Physical Activity: Not on file  Stress: Not on file  Social Connections: Not on file  Intimate Partner Violence: Not on file    Family History  Problem Relation Age of Onset   Stroke Mother    Hypertension Mother    Cancer Father      Current Outpatient Medications:    folic acid (FOLVITE) 1 MG tablet, Take 1 tablet (1 mg total) by mouth daily., Disp: 30 tablet, Rfl: 3   lidocaine-prilocaine (EMLA) cream, Apply 1 application topically as needed., Disp: 30 g, Rfl: 0   oxycodone (OXY-IR) 5 MG capsule, Take 5 mg by mouth every 4 (four) hours as needed., Disp: , Rfl:    ibuprofen (ADVIL) 600 MG tablet, Take 1 tablet (600 mg total) by mouth every 6 (six) hours as needed. (Patient not taking: Reported on 09/30/2021), Disp: 30 tablet, Rfl: 0 No current facility-administered medications for this visit.  Facility-Administered Medications Ordered in Other Visits:    fluorouracil (ADRUCIL) 4,100 mg in sodium chloride 0.9 % 68 mL chemo infusion, 2,400 mg/m2 (Treatment Plan Recorded), Intravenous, 1 day or 1 dose, Sindy Guadeloupe, MD   fluorouracil (ADRUCIL) chemo injection 700 mg, 400 mg/m2 (Treatment Plan Recorded), Intravenous, Once, Sindy Guadeloupe, MD   oxaliplatin (ELOXATIN) 110 mg in dextrose 5 % 500 mL chemo infusion, 65 mg/m2 (Treatment Plan Recorded), Intravenous, Once, Sindy Guadeloupe, MD, Last Rate: 261 mL/hr at 09/30/21 1109, 110 mg at 09/30/21 1109  Physical exam:  Vitals:   09/30/21 0916  BP: 108/74  Pulse: 76  Resp: 18  Temp: 98 F (36.7 C)  TempSrc: Tympanic   SpO2: 100%  Weight: 132 lb 11.2 oz (60.2 kg)   Physical Exam Constitutional:      General: She is not in acute distress. Cardiovascular:     Rate and Rhythm: Normal rate and regular rhythm.     Heart sounds: Normal heart sounds.  Pulmonary:     Effort: Pulmonary effort is normal.     Breath sounds: Normal breath sounds.  Abdominal:     General: Bowel sounds are normal.     Palpations: Abdomen is soft.  Skin:    General: Skin is warm and dry.  Neurological:     Mental Status: She is alert and oriented to person, place, and time.     CMP Latest Ref Rng & Units 09/30/2021  Glucose 70 - 99 mg/dL 88  BUN 8 - 23 mg/dL 11  Creatinine 0.44 - 1.00 mg/dL 0.59  Sodium 135 - 145 mmol/L 137  Potassium 3.5 - 5.1 mmol/L 3.5  Chloride 98 - 111 mmol/L 104  CO2 22 - 32 mmol/L 27  Calcium 8.9 - 10.3 mg/dL 8.9  Total Protein 6.5 - 8.1 g/dL 6.9  Total Bilirubin 0.3 - 1.2  mg/dL 0.5  Alkaline Phos 38 - 126 U/L 81  AST 15 - 41 U/L 19  ALT 0 - 44 U/L 13   CBC Latest Ref Rng & Units 09/30/2021  WBC 4.0 - 10.5 K/uL 4.3  Hemoglobin 12.0 - 15.0 g/dL 10.0(L)  Hematocrit 36.0 - 46.0 % 31.0(L)  Platelets 150 - 400 K/uL 147(L)     Assessment and plan- Patient is a 65 y.o. 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  She is here for on treatment assessment prior to cycle 9 of FOLFOX bevacizumab chemotherapy.  Counts okay to proceed with cycle 9 of FOLFOX bevacizumab chemotherapy today with pump disconnect on day 3.  I will see her back in 2 weeks for cycle 10 of treatment.  I will consider dropping oxaliplatin after 12 cycles.  She will also get repeat scans after 12 cycles.  Mild thrombocytopenia likely secondary to chemotherapy continue to monitor  Normocytic anemia: Likely secondary to chemotherapy and malignancy.  Overall stable continue to monitor   Visit Diagnosis 1. Metastatic colon cancer in female [facility])   2. Encounter for  antineoplastic chemotherapy   3. Encounter for monoclonal antibody treatment for malignancy      Dr. Randa Evens, MD, MPH [facility] at [facility] [phone] 09/30/2021 1:09 PM

## 2021-10-01 LAB — CEA: CEA: 206 ng/mL — ABNORMAL HIGH (ref 0.0–4.7)

## 2021-10-02 ENCOUNTER — Other Ambulatory Visit: Payer: Self-pay

## 2021-10-02 ENCOUNTER — Inpatient Hospital Stay: Payer: BC Managed Care – PPO

## 2021-10-02 VITALS — BP 103/69 | HR 84

## 2021-10-02 DIAGNOSIS — Z5112 Encounter for antineoplastic immunotherapy: Secondary | ICD-10-CM | POA: Diagnosis not present

## 2021-10-02 DIAGNOSIS — C189 Malignant neoplasm of colon, unspecified: Secondary | ICD-10-CM

## 2021-10-02 MED ORDER — HEPARIN SOD (PORK) LOCK FLUSH 100 UNIT/ML IV SOLN
500.0000 [IU] | Freq: Once | INTRAVENOUS | Status: AC | PRN
  Administered 2021-10-02: 500 [IU]
  Filled 2021-10-02: qty 5

## 2021-10-02 MED ORDER — SODIUM CHLORIDE 0.9% FLUSH
10.0000 mL | INTRAVENOUS | Status: DC | PRN
  Administered 2021-10-02: 10 mL
  Filled 2021-10-02: qty 10

## 2021-10-07 ENCOUNTER — Telehealth: Payer: Self-pay | Admitting: *Deleted

## 2021-10-07 NOTE — Telephone Encounter (Signed)
Faxed FMLA/Return to work to [employer] at [phone]

## 2021-10-13 NOTE — Progress Notes (Signed)
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  She received IV Venofer for that.  I was concerned about malignancy given the degree of anemia and therefore obtain CT abdomen and pelvis with contrast CT showed 6.4 cm enhancing mass in the right liver and another mass measuring 4 cm.  Large volume stool in the right colon transverse and descending colon with irregular mass lesion in the distal sigmoid colon extending into the rectosigmoid junction with lateral extracolonic extension into the pelvic sidewall.  Apparent luminal narrowing at the level of the lesion which accounts for large volume of stool proximally.  Multiple pericolonic and perirectal lymph nodes.  Multiple peritoneal nodules are seen in the lower right pelvic sidewall.  Colon mass appears to be invading posterior uterus.Thrombosis of the superior rectal vein may be bland thrombus although direct tumor invasion not excluded.   Patient had a flexible sigmoidoscopy which showed a large obstructing mass in the rectosigmoid colon.  Scope could not be traversed beyond the obstruction.  Biopsies were taken and were consistent with adenocarcinoma. Patient had a diverting colostomy on 05/01/2021 with Dr. Dahlia Byes   Patient started palliative FOLFOX chemotherapy in June 2022 with plans to add Avastin after 6 cycles of treatment    Interval history-patient reports doing well and  denies any specific complaints at this time.  Tolerating chemotherapy well so far  ECOG PS- 1 Pain scale- 0   Review of systems- Review of Systems  Constitutional:  Positive for malaise/fatigue. Negative for chills, fever and weight loss.  HENT:  Negative for congestion, ear discharge and nosebleeds.   Eyes:  Negative for blurred vision.  Respiratory:  Negative for cough, hemoptysis, sputum production, shortness of breath and wheezing.   Cardiovascular:  Negative for chest pain, palpitations, orthopnea and claudication.  Gastrointestinal:  Negative for abdominal pain, blood in stool, constipation, diarrhea, heartburn, melena, nausea and vomiting.  Genitourinary:  Negative for dysuria, flank pain, frequency, hematuria and urgency.  Musculoskeletal:  Negative for back pain, joint pain and myalgias.  Skin:  Negative for rash.  Neurological:  Negative for dizziness, tingling, focal weakness, seizures, weakness and headaches.  Endo/Heme/Allergies:  Does not bruise/bleed easily.  Psychiatric/Behavioral:  Negative for depression and suicidal ideas. The patient does not have insomnia.      No Known Allergies   Past Medical History:  Diagnosis Date   Allergy    Anemia      Past Surgical History:  Procedure Laterality Date   FLEXIBLE SIGMOIDOSCOPY N/A 04/24/2021   Procedure: FLEXIBLE SIGMOIDOSCOPY;  Surgeon: [name], MD;  Location: [facility] ENDOSCOPY;  Service: Gastroenterology;  Laterality: N/A;   PORTACATH PLACEMENT N/A 05/01/2021   Procedure: INSERTION PORT-A-CATH;  Surgeon: Jules Husbands, MD;  Location: [facility] ORS;  Service: General;  Laterality: N/A;    Social History   Socioeconomic History   Marital status: Married    Spouse name: Not on file   Number of children: Not on file   Years of education: Not on  file   Highest education level: Not on file  Occupational History   Not on file  Tobacco Use   Smoking status: Never   Smokeless tobacco: Never  Vaping Use   Vaping Use:  Never used  Substance and Sexual Activity   Alcohol use: Not Currently   Drug use: Never   Sexual activity: Not Currently  Other Topics Concern   Not on file  Social History Narrative   Not on file   Social Determinants of Health   Financial Resource Strain: Not on file  Food Insecurity: Not on file  Transportation Needs: Not on file  Physical Activity: Not on file  Stress: Not on file  Social Connections: Not on file  Intimate Partner Violence: Not on file    Family History  Problem Relation Age of Onset   Stroke Mother    Hypertension Mother    Cancer Father      Current Outpatient Medications:    folic acid (FOLVITE) 1 MG tablet, Take 1 tablet (1 mg total) by mouth daily., Disp: 30 tablet, Rfl: 3   ibuprofen (ADVIL) 600 MG tablet, Take 1 tablet (600 mg total) by mouth every 6 (six) hours as needed. (Patient not taking: Reported on 09/30/2021), Disp: 30 tablet, Rfl: 0   lidocaine-prilocaine (EMLA) cream, Apply 1 application topically as needed., Disp: 30 g, Rfl: 0   oxycodone (OXY-IR) 5 MG capsule, Take 5 mg by mouth every 4 (four) hours as needed., Disp: , Rfl:   Physical exam:  Vitals:   10/14/21 0855  BP: 136/85  Pulse: 75  Resp: 16  Temp: (!) 97.4 F (36.3 C)  SpO2: 100%  Weight: 134 lb 6.4 oz (61 kg)   Physical Exam Cardiovascular:     Rate and Rhythm: Normal rate and regular rhythm.     Heart sounds: Normal heart sounds.  Pulmonary:     Effort: Pulmonary effort is normal.     Breath sounds: Normal breath sounds.  Abdominal:     General: Bowel sounds are normal.     Palpations: Abdomen is soft.     Comments: Colostomy in place  Skin:    General: Skin is warm and dry.  Neurological:     Mental Status: She is alert and oriented to person, place, and time.     CMP Latest Ref Rng & Units 10/14/2021  Glucose 70 - 99 mg/dL 74  BUN 8 - 23 mg/dL 9  Creatinine 0.44 - 1.00 mg/dL 0.59  Sodium 135 - 145 mmol/L 136  Potassium 3.5 - 5.1 mmol/L 3.3(L)   Chloride 98 - 111 mmol/L 104  CO2 22 - 32 mmol/L 25  Calcium 8.9 - 10.3 mg/dL 8.4(L)  Total Protein 6.5 - 8.1 g/dL 6.8  Total Bilirubin 0.3 - 1.2 mg/dL 0.2(L)  Alkaline Phos 38 - 126 U/L 75  AST 15 - 41 U/L 20  ALT 0 - 44 U/L 11   CBC Latest Ref Rng & Units 10/14/2021  WBC 4.0 - 10.5 K/uL 4.1  Hemoglobin 12.0 - 15.0 g/dL 9.2(L)  Hematocrit 36.0 - 46.0 % 28.8(L)  Platelets 150 - 400 K/uL 115(L)    Assessment and plan- Patient is a 65 y.o. female  with metastatic adenocarcinoma of the sigmoid colon/rectum with peritoneal and liver metastases.  She is here for on treatment assessment prior to cycle 10 of FOLFOX bevacizumab chemotherapy.  Counts okay to proceed with cycle 10 of FOLFOX bevacizumab chemotherapy today with pump disconnect on day 3.  I will  see her back in 2 weeks for cycle 11.  Plan to repeat scans after 12 cycles.  Chemo induced thrombocytopenia-stable continue to monitor.  Probably related to oxaliplatin  Chemo induced anemia-stable continue to monitor   Visit Diagnosis 1. Encounter for antineoplastic chemotherapy   2. Metastatic colon cancer in female [facility])   3. Encounter for monoclonal antibody treatment for malignancy   4. Chemotherapy-induced thrombocytopenia   5. Antineoplastic chemotherapy induced anemia      Dr. Randa Evens, MD, MPH [facility] at [facility] [phone] 10/14/2021 12:44 PM

## 2021-10-14 ENCOUNTER — Other Ambulatory Visit: Payer: Self-pay

## 2021-10-14 ENCOUNTER — Inpatient Hospital Stay: Payer: BC Managed Care – PPO

## 2021-10-14 ENCOUNTER — Inpatient Hospital Stay (HOSPITAL_BASED_OUTPATIENT_CLINIC_OR_DEPARTMENT_OTHER): Payer: BC Managed Care – PPO | Admitting: Oncology

## 2021-10-14 ENCOUNTER — Encounter: Payer: Self-pay | Admitting: Oncology

## 2021-10-14 VITALS — BP 129/91 | HR 73 | Resp 20

## 2021-10-14 VITALS — BP 136/85 | HR 75 | Temp 97.4°F | Resp 16 | Wt 134.4 lb

## 2021-10-14 DIAGNOSIS — C189 Malignant neoplasm of colon, unspecified: Secondary | ICD-10-CM | POA: Diagnosis not present

## 2021-10-14 DIAGNOSIS — Z5111 Encounter for antineoplastic chemotherapy: Secondary | ICD-10-CM | POA: Diagnosis not present

## 2021-10-14 DIAGNOSIS — D6959 Other secondary thrombocytopenia: Secondary | ICD-10-CM

## 2021-10-14 DIAGNOSIS — D6481 Anemia due to antineoplastic chemotherapy: Secondary | ICD-10-CM

## 2021-10-14 DIAGNOSIS — Z5112 Encounter for antineoplastic immunotherapy: Secondary | ICD-10-CM

## 2021-10-14 DIAGNOSIS — T451X5A Adverse effect of antineoplastic and immunosuppressive drugs, initial encounter: Secondary | ICD-10-CM

## 2021-10-14 LAB — CBC WITH DIFFERENTIAL/PLATELET
Abs Immature Granulocytes: 0.01 10*3/uL (ref 0.00–0.07)
Basophils Absolute: 0 10*3/uL (ref 0.0–0.1)
Basophils Relative: 1 %
Eosinophils Absolute: 0.1 10*3/uL (ref 0.0–0.5)
Eosinophils Relative: 2 %
HCT: 28.8 % — ABNORMAL LOW (ref 36.0–46.0)
Hemoglobin: 9.2 g/dL — ABNORMAL LOW (ref 12.0–15.0)
Immature Granulocytes: 0 %
Lymphocytes Relative: 28 %
Lymphs Abs: 1.1 10*3/uL (ref 0.7–4.0)
MCH: 30 pg (ref 26.0–34.0)
MCHC: 31.9 g/dL (ref 30.0–36.0)
MCV: 93.8 fL (ref 80.0–100.0)
Monocytes Absolute: 0.7 10*3/uL (ref 0.1–1.0)
Monocytes Relative: 17 %
Neutro Abs: 2.1 10*3/uL (ref 1.7–7.7)
Neutrophils Relative %: 52 %
Platelets: 115 10*3/uL — ABNORMAL LOW (ref 150–400)
RBC: 3.07 MIL/uL — ABNORMAL LOW (ref 3.87–5.11)
RDW: 14.4 % (ref 11.5–15.5)
WBC: 4.1 10*3/uL (ref 4.0–10.5)
nRBC: 0 % (ref 0.0–0.2)

## 2021-10-14 LAB — COMPREHENSIVE METABOLIC PANEL
ALT: 11 U/L (ref 0–44)
AST: 20 U/L (ref 15–41)
Albumin: 2.8 g/dL — ABNORMAL LOW (ref 3.5–5.0)
Alkaline Phosphatase: 75 U/L (ref 38–126)
Anion gap: 7 (ref 5–15)
BUN: 9 mg/dL (ref 8–23)
CO2: 25 mmol/L (ref 22–32)
Calcium: 8.4 mg/dL — ABNORMAL LOW (ref 8.9–10.3)
Chloride: 104 mmol/L (ref 98–111)
Creatinine, Ser: 0.59 mg/dL (ref 0.44–1.00)
GFR, Estimated: >60 mL/min (ref >60)
Glucose, Bld: 74 mg/dL (ref 70–99)
Potassium: 3.3 mmol/L — ABNORMAL LOW (ref 3.5–5.1)
Sodium: 136 mmol/L (ref 135–145)
Total Bilirubin: 0.2 mg/dL — ABNORMAL LOW (ref 0.3–1.2)
Total Protein: 6.8 g/dL (ref 6.5–8.1)

## 2021-10-14 LAB — PROTEIN, URINE, RANDOM: Total Protein, Urine: 9 mg/dL

## 2021-10-14 MED ORDER — SODIUM CHLORIDE 0.9 % IV SOLN
2400.0000 mg/m2 | INTRAVENOUS | Status: DC
  Administered 2021-10-14: 4100 mg via INTRAVENOUS
  Filled 2021-10-14: qty 82

## 2021-10-14 MED ORDER — OXALIPLATIN CHEMO INJECTION 100 MG/20ML
65.0000 mg/m2 | Freq: Once | INTRAVENOUS | Status: AC
  Administered 2021-10-14: 110 mg via INTRAVENOUS
  Filled 2021-10-14: qty 20

## 2021-10-14 MED ORDER — PROCHLORPERAZINE EDISYLATE 10 MG/2ML IJ SOLN
10.0000 mg | Freq: Once | INTRAMUSCULAR | Status: AC
  Administered 2021-10-14: 10 mg via INTRAVENOUS
  Filled 2021-10-14: qty 2

## 2021-10-14 MED ORDER — FLUOROURACIL CHEMO INJECTION 2.5 GM/50ML
400.0000 mg/m2 | Freq: Once | INTRAVENOUS | Status: AC
  Administered 2021-10-14: 700 mg via INTRAVENOUS
  Filled 2021-10-14: qty 14

## 2021-10-14 MED ORDER — DIPHENHYDRAMINE HCL 50 MG/ML IJ SOLN
50.0000 mg | Freq: Once | INTRAMUSCULAR | Status: AC | PRN
  Administered 2021-10-14: 25 mg via INTRAVENOUS

## 2021-10-14 MED ORDER — PALONOSETRON HCL INJECTION 0.25 MG/5ML
0.2500 mg | Freq: Once | INTRAVENOUS | Status: AC
  Administered 2021-10-14: 0.25 mg via INTRAVENOUS
  Filled 2021-10-14: qty 5

## 2021-10-14 MED ORDER — SODIUM CHLORIDE 0.9% FLUSH
10.0000 mL | INTRAVENOUS | Status: DC | PRN
  Administered 2021-10-14: 10 mL via INTRAVENOUS
  Filled 2021-10-14: qty 10

## 2021-10-14 MED ORDER — SODIUM CHLORIDE 0.9 % IV SOLN
10.0000 mg | Freq: Once | INTRAVENOUS | Status: AC
  Administered 2021-10-14: 10 mg via INTRAVENOUS
  Filled 2021-10-14: qty 10

## 2021-10-14 MED ORDER — SODIUM CHLORIDE 0.9 % IV SOLN
Freq: Once | INTRAVENOUS | Status: AC
Start: 2021-10-14 — End: 2021-10-14
  Filled 2021-10-14: qty 250

## 2021-10-14 MED ORDER — LEUCOVORIN CALCIUM INJECTION 350 MG
700.0000 mg | Freq: Once | INTRAVENOUS | Status: AC
  Administered 2021-10-14: 700 mg via INTRAVENOUS
  Filled 2021-10-14: qty 35

## 2021-10-14 MED ORDER — SODIUM CHLORIDE 0.9 % IV SOLN
5.0000 mg/kg | Freq: Once | INTRAVENOUS | Status: AC
  Administered 2021-10-14: 300 mg via INTRAVENOUS
  Filled 2021-10-14: qty 12

## 2021-10-14 MED ORDER — DEXTROSE 5 % IV SOLN
Freq: Once | INTRAVENOUS | Status: AC
  Filled 2021-10-14: qty 250

## 2021-10-14 MED ORDER — POTASSIUM CHLORIDE CRYS ER 20 MEQ PO TBCR: 20.0000 meq | EXTENDED_RELEASE_TABLET | Freq: Every day | ORAL | 0 refills | Status: DC

## 2021-10-14 MED ORDER — SODIUM CHLORIDE 0.9 % IV SOLN
Freq: Once | INTRAVENOUS | Status: DC | PRN
  Filled 2021-10-14: qty 250

## 2021-10-14 MED ORDER — METHYLPREDNISOLONE SODIUM SUCC 125 MG IJ SOLR
125.0000 mg | Freq: Once | INTRAMUSCULAR | Status: AC | PRN
  Administered 2021-10-14: 125 mg via INTRAVENOUS

## 2021-10-14 NOTE — Progress Notes (Signed)
Hypersensitivity Reaction note  Date of event: 10/14/21 Time of event: 1135 Generic name of drug involved: Oxaliplatin Name of provider notified of the hypersensitivity reaction: Dr Janese Banks Was agent that likely caused hypersensitivity reaction added to Allergies List within EMR? yes Chain of events including reaction signs/symptoms, treatment administered, and outcome (e.g., drug resumed; drug discontinued; sent to Emergency Department; etc.)   1135-pt returned from bathroom stating she needed to throw up.Marland KitchenMarland KitchenStarted  vomiting, very diaphoretic. Oxaliplatin and leucovorin stopped, NS bolus started. VSS (see flowsheet) Solumedrol and Benadryl given. Dr Janese Banks notified and chairside at 1136. Compazine 10mg  IV x1 ordered.   1205-IVF stopped. Pt resting comfortably. All symptoms resolved.  1233- 70fu pump applied.   Johnsie Cancel, RN 10/14/2021 12:00 PM   Okay to proceed with avastin per Dr Janese Banks with urine results from 09/30/2021

## 2021-10-14 NOTE — Patient Instructions (Signed)
Please got to your pharmacy [facility] [address] for potassium prescription and take 1 tablet a day for 3 weeks

## 2021-10-14 NOTE — Patient Instructions (Signed)
[facility]  Discharge Instructions: Thank you for choosing [facility] to provide your oncology and hematology care.  If you have a lab appointment with the [facility], please go directly to the [facility] and check in at the registration area.  Wear comfortable clothing and clothing appropriate for easy access to any Portacath or PICC line.   We strive to give you quality time with your provider. You may need to reschedule your appointment if you arrive late (15 or more minutes).  Arriving late affects you and other patients whose appointments are after yours.  Also, if you miss three or more appointments without notifying the office, you may be dismissed from the clinic at the provider's discretion.      For prescription refill requests, have your pharmacy contact our office and allow 72 hours for refills to be completed.    Today you received the following chemotherapy and/or immunotherapy agents The chemotherapy medication bag should finish at 46 hours, 96 hours, or 7 days. For example, if your pump is scheduled for 46 hours and it was put on at 4:00 p.m., it should finish at 2:00 p.m. the day it is scheduled to come off regardless of your appointment time.     Estimated time to finish at 10:35am.   If the display on your pump reads "Low Volume" and it is beeping, take the batteries out of the pump and come to the cancer center for it to be taken off.   If the pump alarms go off prior to the pump reading "Low Volume" then call [phone] and someone can assist you.  If the plunger comes out and the chemotherapy medication is leaking out, please use your home chemo spill kit to clean up the spill. Do NOT use paper towels or other household products.  If you have problems or questions regarding your pump, please call either [phone] (24 hours a day) or the cancer center Monday-Friday 8:00 a.m.- 4:30 p.m. at the clinic number  and we will assist you. If you are unable to get assistance, then go to the nearest Emergency Department and ask the staff to contact the IV team for assistance.        To help prevent nausea and vomiting after your treatment, we encourage you to take your nausea medication as directed.  BELOW ARE SYMPTOMS THAT SHOULD BE REPORTED IMMEDIATELY: *FEVER GREATER THAN 100.4 F (38 C) OR HIGHER *CHILLS OR SWEATING *NAUSEA AND VOMITING THAT IS NOT CONTROLLED WITH YOUR NAUSEA MEDICATION *UNUSUAL SHORTNESS OF BREATH *UNUSUAL BRUISING OR BLEEDING *URINARY PROBLEMS (pain or burning when urinating, or frequent urination) *BOWEL PROBLEMS (unusual diarrhea, constipation, pain near the anus) TENDERNESS IN MOUTH AND THROAT WITH OR WITHOUT PRESENCE OF ULCERS (sore throat, sores in mouth, or a toothache) UNUSUAL RASH, SWELLING OR PAIN  UNUSUAL VAGINAL DISCHARGE OR ITCHING   Items with * indicate a potential emergency and should be followed up as soon as possible or go to the Emergency Department if any problems should occur.  Please show the CHEMOTHERAPY ALERT CARD or IMMUNOTHERAPY ALERT CARD at check-in to the Emergency Department and triage nurse.  Should you have questions after your visit or need to cancel or reschedule your appointment, please contact [facility]  [phone] and follow the prompts.  Office hours are 8:00 a.m. to 4:30 p.m. Monday - Friday. Please note that voicemails left after 4:00 p.m. may not be returned until the following  business day.  We are closed weekends and major holidays. You have access to a nurse at all times for urgent questions. Please call the main number to the clinic [phone] and follow the prompts.  For any non-urgent questions, you may also contact your provider using MyChart. We now offer e-Visits for anyone 33 and older to request care online for non-urgent symptoms. For details visit [email or web address].   Also download  the MyChart app! Go to the app store, search "MyChart", open the app, select [facility], and log in with your MyChart username and password.  Due to Covid, a mask is required upon entering the hospital/clinic. If you do not have a mask, one will be given to you upon arrival. For doctor visits, patients may have 1 support person aged 58 or older with them. For treatment visits, patients cannot have anyone with them due to current Covid guidelines and our immunocompromised population.  Fluorouracil, 5-FU injection What is this medication? FLUOROURACIL, 5-FU (flure oh YOOR a sil) is a chemotherapy drug. It slows the growth of cancer cells. This medicine is used to treat many types of cancer like breast cancer, colon or rectal cancer, pancreatic cancer, and stomach cancer. This medicine may be used for other purposes; ask your health care provider or pharmacist if you have questions. COMMON BRAND NAME(S): Adrucil What should I tell my care team before I take this medication? They need to know if you have any of these conditions: blood disorders dihydropyrimidine dehydrogenase (DPD) deficiency infection (especially a virus infection such as chickenpox, cold sores, or herpes) kidney disease liver disease malnourished, poor nutrition recent or ongoing radiation therapy an unusual or allergic reaction to fluorouracil, other chemotherapy, other medicines, foods, dyes, or preservatives pregnant or trying to get pregnant breast-feeding How should I use this medication? This drug is given as an infusion or injection into a vein. It is administered in a hospital or clinic by a specially trained health care professional. Talk to your pediatrician regarding the use of this medicine in children. Special care may be needed. Overdosage: If you think you have taken too much of this medicine contact a poison control center or emergency room at once. NOTE: This medicine is only for you. Do not share this  medicine with others. What if I miss a dose? It is important not to miss your dose. Call your doctor or health care professional if you are unable to keep an appointment. What may interact with this medication? Do not take this medicine with any of the following medications: live virus vaccines This medicine may also interact with the following medications: medicines that treat or prevent blood clots like warfarin, enoxaparin, and dalteparin This list may not describe all possible interactions. Give your health care provider a list of all the medicines, herbs, non-prescription drugs, or dietary supplements you use. Also tell them if you smoke, drink alcohol, or use illegal drugs. Some items may interact with your medicine. What should I watch for while using this medication? Visit your doctor for checks on your progress. This drug may make you feel generally unwell. This is not uncommon, as chemotherapy can affect healthy cells as well as cancer cells. Report any side effects. Continue your course of treatment even though you feel ill unless your doctor tells you to stop. In some cases, you may be given additional medicines to help with side effects. Follow all directions for their use. Call your doctor or health care professional for advice if you get  a fever, chills or sore throat, or other symptoms of a cold or flu. Do not treat yourself. This drug decreases your body's ability to fight infections. Try to avoid being around people who are sick. This medicine may increase your risk to bruise or bleed. Call your doctor or health care professional if you notice any unusual bleeding. Be careful brushing and flossing your teeth or using a toothpick because you may get an infection or bleed more easily. If you have any dental work done, tell your dentist you are receiving this medicine. Avoid taking products that contain aspirin, acetaminophen, ibuprofen, naproxen, or ketoprofen unless instructed by your  doctor. These medicines may hide a fever. Do not become pregnant while taking this medicine. Women should inform their doctor if they wish to become pregnant or think they might be pregnant. There is a potential for serious side effects to an unborn child. Talk to your health care professional or pharmacist for more information. Do not breast-feed an infant while taking this medicine. Men should inform their doctor if they wish to father a child. This medicine may lower sperm counts. Do not treat diarrhea with over the counter products. Contact your doctor if you have diarrhea that lasts more than 2 days or if it is severe and watery. This medicine can make you more sensitive to the sun. Keep out of the sun. If you cannot avoid being in the sun, wear protective clothing and use sunscreen. Do not use sun lamps or tanning beds/booths. What side effects may I notice from receiving this medication? Side effects that you should report to your doctor or health care professional as soon as possible: allergic reactions like skin rash, itching or hives, swelling of the face, lips, or tongue low blood counts - this medicine may decrease the number of white blood cells, red blood cells and platelets. You may be at increased risk for infections and bleeding. signs of infection - fever or chills, cough, sore throat, pain or difficulty passing urine signs of decreased platelets or bleeding - bruising, pinpoint red spots on the skin, black, tarry stools, blood in the urine signs of decreased red blood cells - unusually weak or tired, fainting spells, lightheadedness breathing problems changes in vision chest pain mouth sores nausea and vomiting pain, swelling, redness at site where injected pain, tingling, numbness in the hands or feet redness, swelling, or sores on hands or feet stomach pain unusual bleeding Side effects that usually do not require medical attention (report to your doctor or health care  professional if they continue or are bothersome): changes in finger or toe nails diarrhea dry or itchy skin hair loss headache loss of appetite sensitivity of eyes to the light stomach upset unusually teary eyes This list may not describe all possible side effects. Call your doctor for medical advice about side effects. You may report side effects to FDA at 1-800-FDA-1088. Where should I keep my medication? This drug is given in a hospital or clinic and will not be stored at home. NOTE: This sheet is a summary. It may not cover all possible information. If you have questions about this medicine, talk to your doctor, pharmacist, or health care provider.  2022 [employer] (2021-08-05 00:00:00)

## 2021-10-15 ENCOUNTER — Telehealth: Payer: Self-pay

## 2021-10-15 LAB — CEA: CEA: 144 ng/mL — ABNORMAL HIGH (ref 0.0–4.7)

## 2021-10-16 ENCOUNTER — Other Ambulatory Visit: Payer: Self-pay

## 2021-10-16 ENCOUNTER — Inpatient Hospital Stay: Payer: BC Managed Care – PPO

## 2021-10-16 VITALS — BP 111/69 | HR 81 | Temp 97.0°F | Resp 18

## 2021-10-16 DIAGNOSIS — Z5112 Encounter for antineoplastic immunotherapy: Secondary | ICD-10-CM | POA: Diagnosis not present

## 2021-10-16 DIAGNOSIS — C189 Malignant neoplasm of colon, unspecified: Secondary | ICD-10-CM

## 2021-10-16 MED ORDER — HEPARIN SOD (PORK) LOCK FLUSH 100 UNIT/ML IV SOLN
500.0000 [IU] | Freq: Once | INTRAVENOUS | Status: AC | PRN
  Filled 2021-10-16: qty 5

## 2021-10-16 MED ORDER — SODIUM CHLORIDE 0.9% FLUSH
10.0000 mL | INTRAVENOUS | Status: DC | PRN
  Administered 2021-10-16: 15:00:00 10 mL
  Filled 2021-10-16: qty 10

## 2021-10-16 MED ORDER — HEPARIN SOD (PORK) LOCK FLUSH 100 UNIT/ML IV SOLN
INTRAVENOUS | Status: AC
  Administered 2021-10-16: 15:00:00 500 [IU]
  Filled 2021-10-16: qty 5

## 2021-10-28 ENCOUNTER — Inpatient Hospital Stay (HOSPITAL_BASED_OUTPATIENT_CLINIC_OR_DEPARTMENT_OTHER): Payer: BC Managed Care – PPO | Admitting: Oncology

## 2021-10-28 ENCOUNTER — Other Ambulatory Visit: Payer: Self-pay

## 2021-10-28 ENCOUNTER — Encounter: Payer: Self-pay | Admitting: Oncology

## 2021-10-28 ENCOUNTER — Inpatient Hospital Stay: Payer: BC Managed Care – PPO

## 2021-10-28 VITALS — BP 126/87 | HR 91 | Temp 97.6°F | Resp 16 | Wt 129.9 lb

## 2021-10-28 DIAGNOSIS — C189 Malignant neoplasm of colon, unspecified: Secondary | ICD-10-CM

## 2021-10-28 DIAGNOSIS — Z5111 Encounter for antineoplastic chemotherapy: Secondary | ICD-10-CM

## 2021-10-28 DIAGNOSIS — Z5112 Encounter for antineoplastic immunotherapy: Secondary | ICD-10-CM | POA: Diagnosis not present

## 2021-10-28 LAB — CBC WITH DIFFERENTIAL/PLATELET
Abs Immature Granulocytes: 0.01 10*3/uL (ref 0.00–0.07)
Basophils Absolute: 0.1 10*3/uL (ref 0.0–0.1)
Basophils Relative: 1 %
Eosinophils Absolute: 0 10*3/uL (ref 0.0–0.5)
Eosinophils Relative: 1 %
HCT: 31.7 % — ABNORMAL LOW (ref 36.0–46.0)
Hemoglobin: 9.9 g/dL — ABNORMAL LOW (ref 12.0–15.0)
Immature Granulocytes: 0 %
Lymphocytes Relative: 22 %
Lymphs Abs: 0.9 10*3/uL (ref 0.7–4.0)
MCH: 29.5 pg (ref 26.0–34.0)
MCHC: 31.2 g/dL (ref 30.0–36.0)
MCV: 94.3 fL (ref 80.0–100.0)
Monocytes Absolute: 0.8 10*3/uL (ref 0.1–1.0)
Monocytes Relative: 19 %
Neutro Abs: 2.3 10*3/uL (ref 1.7–7.7)
Neutrophils Relative %: 57 %
Platelets: 144 10*3/uL — ABNORMAL LOW (ref 150–400)
RBC: 3.36 MIL/uL — ABNORMAL LOW (ref 3.87–5.11)
RDW: 15.5 % (ref 11.5–15.5)
WBC: 4 10*3/uL (ref 4.0–10.5)
nRBC: 0 % (ref 0.0–0.2)

## 2021-10-28 LAB — COMPREHENSIVE METABOLIC PANEL
ALT: 12 U/L (ref 0–44)
AST: 21 U/L (ref 15–41)
Albumin: 3.1 g/dL — ABNORMAL LOW (ref 3.5–5.0)
Alkaline Phosphatase: 79 U/L (ref 38–126)
Anion gap: 8 (ref 5–15)
BUN: 10 mg/dL (ref 8–23)
CO2: 23 mmol/L (ref 22–32)
Calcium: 8.8 mg/dL — ABNORMAL LOW (ref 8.9–10.3)
Chloride: 105 mmol/L (ref 98–111)
Creatinine, Ser: 0.65 mg/dL (ref 0.44–1.00)
GFR, Estimated: >60 mL/min (ref >60)
Glucose, Bld: 91 mg/dL (ref 70–99)
Potassium: 3.8 mmol/L (ref 3.5–5.1)
Sodium: 136 mmol/L (ref 135–145)
Total Bilirubin: 0.5 mg/dL (ref 0.3–1.2)
Total Protein: 7.1 g/dL (ref 6.5–8.1)

## 2021-10-28 LAB — PROTEIN, URINE, RANDOM: Total Protein, Urine: <6 mg/dL

## 2021-10-28 MED ORDER — SODIUM CHLORIDE 0.9 % IV SOLN
2400.0000 mg/m2 | INTRAVENOUS | Status: DC
  Administered 2021-10-28: 4100 mg via INTRAVENOUS
  Filled 2021-10-28: qty 82

## 2021-10-28 MED ORDER — SODIUM CHLORIDE 0.9% FLUSH
10.0000 mL | INTRAVENOUS | Status: DC | PRN
  Filled 2021-10-28: qty 10

## 2021-10-28 MED ORDER — LEUCOVORIN CALCIUM INJECTION 350 MG
700.0000 mg | Freq: Once | INTRAVENOUS | Status: AC
  Administered 2021-10-28: 700 mg via INTRAVENOUS
  Filled 2021-10-28: qty 35

## 2021-10-28 MED ORDER — LIDOCAINE-PRILOCAINE 2.5-2.5 % EX CREA: 1.0000 "application " | TOPICAL_CREAM | CUTANEOUS | 2 refills | Status: DC | PRN

## 2021-10-28 MED ORDER — SODIUM CHLORIDE 0.9 % IV SOLN
10.0000 mg | Freq: Once | INTRAVENOUS | Status: AC
  Administered 2021-10-28: 10 mg via INTRAVENOUS
  Filled 2021-10-28: qty 10

## 2021-10-28 MED ORDER — FLUOROURACIL CHEMO INJECTION 2.5 GM/50ML
400.0000 mg/m2 | Freq: Once | INTRAVENOUS | Status: AC
  Administered 2021-10-28: 700 mg via INTRAVENOUS
  Filled 2021-10-28: qty 14

## 2021-10-28 MED ORDER — SODIUM CHLORIDE 0.9 % IV SOLN
Freq: Once | INTRAVENOUS | Status: AC
  Filled 2021-10-28: qty 250

## 2021-10-28 MED ORDER — SODIUM CHLORIDE 0.9 % IV SOLN
5.0000 mg/kg | Freq: Once | INTRAVENOUS | Status: AC
  Administered 2021-10-28: 300 mg via INTRAVENOUS
  Filled 2021-10-28: qty 12

## 2021-10-28 MED ORDER — PALONOSETRON HCL INJECTION 0.25 MG/5ML: 0.2500 mg | Freq: Once | INTRAVENOUS | Status: DC

## 2021-10-28 NOTE — Progress Notes (Unsigned)
No blood return noted with port access. Port flushes well with no edema or pain noted. Dr. Janese Banks made aware. Patient to have peripheral labs.

## 2021-10-28 NOTE — Progress Notes (Signed)
Reaction on last infusion of oxaliplatin. No oxali today per Dr. Janese Banks. OK to proceed with Avastin without UA result. OK to proceed with treatment without blood return from port. Flushes easily without redness, swelling, or pain.

## 2021-10-28 NOTE — Progress Notes (Signed)
error 

## 2021-10-28 NOTE — Patient Instructions (Signed)
- [facility]  Discharge Instructions: Thank you for choosing [facility] to provide your oncology and hematology care.  If you have a lab appointment with the [facility], please go directly to the [facility] and check in at the registration area.  Wear comfortable clothing and clothing appropriate for easy access to any Portacath or PICC line.   We strive to give you quality time with your provider. You may need to reschedule your appointment if you arrive late (15 or more minutes).  Arriving late affects you and other patients whose appointments are after yours.  Also, if you miss three or more appointments without notifying the office, you may be dismissed from the clinic at the provider's discretion.      For prescription refill requests, have your pharmacy contact our office and allow 72 hours for refills to be completed.    Today you received the following chemotherapy and/or immunotherapy agents - bevacizumab, fluorouracil      To help prevent nausea and vomiting after your treatment, we encourage you to take your nausea medication as directed.  BELOW ARE SYMPTOMS THAT SHOULD BE REPORTED IMMEDIATELY: *FEVER GREATER THAN 100.4 F (38 C) OR HIGHER *CHILLS OR SWEATING *NAUSEA AND VOMITING THAT IS NOT CONTROLLED WITH YOUR NAUSEA MEDICATION *UNUSUAL SHORTNESS OF BREATH *UNUSUAL BRUISING OR BLEEDING *URINARY PROBLEMS (pain or burning when urinating, or frequent urination) *BOWEL PROBLEMS (unusual diarrhea, constipation, pain near the anus) TENDERNESS IN MOUTH AND THROAT WITH OR WITHOUT PRESENCE OF ULCERS (sore throat, sores in mouth, or a toothache) UNUSUAL RASH, SWELLING OR PAIN  UNUSUAL VAGINAL DISCHARGE OR ITCHING   Items with * indicate a potential emergency and should be followed up as soon as possible or go to the Emergency Department if any problems should occur.  Please show the CHEMOTHERAPY ALERT CARD or IMMUNOTHERAPY  ALERT CARD at check-in to the Emergency Department and triage nurse.  Should you have questions after your visit or need to cancel or reschedule your appointment, please contact [facility]  [phone] and follow the prompts.  Office hours are 8:00 a.m. to 4:30 p.m. Monday - Friday. Please note that voicemails left after 4:00 p.m. may not be returned until the following business day.  We are closed weekends and major holidays. You have access to a nurse at all times for urgent questions. Please call the main number to the clinic [phone] and follow the prompts.  For any non-urgent questions, you may also contact your provider using MyChart. We now offer e-Visits for anyone 71 and older to request care online for non-urgent symptoms. For details visit [email or web address].   Also download the MyChart app! Go to the app store, search "MyChart", open the app, select [facility], and log in with your MyChart username and password.  Due to Covid, a mask is required upon entering the hospital/clinic. If you do not have a mask, one will be given to you upon arrival. For doctor visits, patients may have 1 support person aged 31 or older with them. For treatment visits, patients cannot have anyone with them due to current Covid guidelines and our immunocompromised population.   Bevacizumab injection What is this medication? BEVACIZUMAB (be va SIZ yoo mab) is a monoclonal antibody. It is used to treat many types of cancer. This medicine may be used for other purposes; ask your health care provider or pharmacist if you have questions. COMMON BRAND NAME(S): Alymsys, Avastin, MVASI, Zirabev What  should I tell my care team before I take this medication? They need to know if you have any of these conditions: diabetes heart disease high blood pressure history of coughing up blood prior anthracycline chemotherapy (e.g., doxorubicin, daunorubicin, epirubicin) recent  or ongoing radiation therapy recent or planning to have surgery stroke an unusual or allergic reaction to bevacizumab, hamster proteins, mouse proteins, other medicines, foods, dyes, or preservatives pregnant or trying to get pregnant breast-feeding How should I use this medication? This medicine is for infusion into a vein. It is given by a health care professional in a hospital or clinic setting. Talk to your pediatrician regarding the use of this medicine in children. Special care may be needed. Overdosage: If you think you have taken too much of this medicine contact a poison control center or emergency room at once. NOTE: This medicine is only for you. Do not share this medicine with others. What if I miss a dose? It is important not to miss your dose. Call your doctor or health care professional if you are unable to keep an appointment. What may interact with this medication? Interactions are not expected. This list may not describe all possible interactions. Give your health care provider a list of all the medicines, herbs, non-prescription drugs, or dietary supplements you use. Also tell them if you smoke, drink alcohol, or use illegal drugs. Some items may interact with your medicine. What should I watch for while using this medication? Your condition will be monitored carefully while you are receiving this medicine. You will need important blood work and urine testing done while you are taking this medicine. This medicine may increase your risk to bruise or bleed. Call your doctor or health care professional if you notice any unusual bleeding. Before having surgery, talk to your health care provider to make sure it is ok. This drug can increase the risk of poor healing of your surgical site or wound. You will need to stop this drug for 28 days before surgery. After surgery, wait at least 28 days before restarting this drug. Make sure the surgical site or wound is healed enough before  restarting this drug. Talk to your health care provider if questions. Do not become pregnant while taking this medicine or for 6 months after stopping it. Women should inform their doctor if they wish to become pregnant or think they might be pregnant. There is a potential for serious side effects to an unborn child. Talk to your health care professional or pharmacist for more information. Do not breast-feed an infant while taking this medicine and for 6 months after the last dose. This medicine has caused ovarian failure in some women. This medicine may interfere with the ability to have a child. You should talk to your doctor or health care professional if you are concerned about your fertility. What side effects may I notice from receiving this medication? Side effects that you should report to your doctor or health care professional as soon as possible: allergic reactions like skin rash, itching or hives, swelling of the face, lips, or tongue chest pain or chest tightness chills coughing up blood high fever seizures severe constipation signs and symptoms of bleeding such as bloody or black, tarry stools; red or dark-brown urine; spitting up blood or brown material that looks like coffee grounds; red spots on the skin; unusual bruising or bleeding from the eye, gums, or nose signs and symptoms of a blood clot such as breathing problems; chest pain; severe,  sudden headache; pain, swelling, warmth in the leg signs and symptoms of a stroke like changes in vision; confusion; trouble speaking or understanding; severe headaches; sudden numbness or weakness of the face, arm or leg; trouble walking; dizziness; loss of balance or coordination stomach pain sweating swelling of legs or ankles vomiting weight gain Side effects that usually do not require medical attention (report to your doctor or health care professional if they continue or are bothersome): back pain changes in taste decreased  appetite dry skin nausea tiredness This list may not describe all possible side effects. Call your doctor for medical advice about side effects. You may report side effects to FDA at 1-800-FDA-1088. Where should I keep my medication? This drug is given in a hospital or clinic and will not be stored at home. NOTE: This sheet is a summary. It may not cover all possible information. If you have questions about this medicine, talk to your doctor, pharmacist, or health care provider.  2022 Elsevier/Gold Standard (2021-08-05 00:00:00)  Fluorouracil, 5-FU injection What is this medication? FLUOROURACIL, 5-FU (flure oh YOOR a sil) is a chemotherapy drug. It slows the growth of cancer cells. This medicine is used to treat many types of cancer like breast cancer, colon or rectal cancer, pancreatic cancer, and stomach cancer. This medicine may be used for other purposes; ask your health care provider or pharmacist if you have questions. COMMON BRAND NAME(S): Adrucil What should I tell my care team before I take this medication? They need to know if you have any of these conditions: blood disorders dihydropyrimidine dehydrogenase (DPD) deficiency infection (especially a virus infection such as chickenpox, cold sores, or herpes) kidney disease liver disease malnourished, poor nutrition recent or ongoing radiation therapy an unusual or allergic reaction to fluorouracil, other chemotherapy, other medicines, foods, dyes, or preservatives pregnant or trying to get pregnant breast-feeding How should I use this medication? This drug is given as an infusion or injection into a vein. It is administered in a hospital or clinic by a specially trained health care professional. Talk to your pediatrician regarding the use of this medicine in children. Special care may be needed. Overdosage: If you think you have taken too much of this medicine contact a poison control center or emergency room at once. NOTE:  This medicine is only for you. Do not share this medicine with others. What if I miss a dose? It is important not to miss your dose. Call your doctor or health care professional if you are unable to keep an appointment. What may interact with this medication? Do not take this medicine with any of the following medications: live virus vaccines This medicine may also interact with the following medications: medicines that treat or prevent blood clots like warfarin, enoxaparin, and dalteparin This list may not describe all possible interactions. Give your health care provider a list of all the medicines, herbs, non-prescription drugs, or dietary supplements you use. Also tell them if you smoke, drink alcohol, or use illegal drugs. Some items may interact with your medicine. What should I watch for while using this medication? Visit your doctor for checks on your progress. This drug may make you feel generally unwell. This is not uncommon, as chemotherapy can affect healthy cells as well as cancer cells. Report any side effects. Continue your course of treatment even though you feel ill unless your doctor tells you to stop. In some cases, you may be given additional medicines to help with side effects. Follow all directions  for their use. Call your doctor or health care professional for advice if you get a fever, chills or sore throat, or other symptoms of a cold or flu. Do not treat yourself. This drug decreases your body's ability to fight infections. Try to avoid being around people who are sick. This medicine may increase your risk to bruise or bleed. Call your doctor or health care professional if you notice any unusual bleeding. Be careful brushing and flossing your teeth or using a toothpick because you may get an infection or bleed more easily. If you have any dental work done, tell your dentist you are receiving this medicine. Avoid taking products that contain aspirin, acetaminophen, ibuprofen,  naproxen, or ketoprofen unless instructed by your doctor. These medicines may hide a fever. Do not become pregnant while taking this medicine. Women should inform their doctor if they wish to become pregnant or think they might be pregnant. There is a potential for serious side effects to an unborn child. Talk to your health care professional or pharmacist for more information. Do not breast-feed an infant while taking this medicine. Men should inform their doctor if they wish to father a child. This medicine may lower sperm counts. Do not treat diarrhea with over the counter products. Contact your doctor if you have diarrhea that lasts more than 2 days or if it is severe and watery. This medicine can make you more sensitive to the sun. Keep out of the sun. If you cannot avoid being in the sun, wear protective clothing and use sunscreen. Do not use sun lamps or tanning beds/booths. What side effects may I notice from receiving this medication? Side effects that you should report to your doctor or health care professional as soon as possible: allergic reactions like skin rash, itching or hives, swelling of the face, lips, or tongue low blood counts - this medicine may decrease the number of white blood cells, red blood cells and platelets. You may be at increased risk for infections and bleeding. signs of infection - fever or chills, cough, sore throat, pain or difficulty passing urine signs of decreased platelets or bleeding - bruising, pinpoint red spots on the skin, black, tarry stools, blood in the urine signs of decreased red blood cells - unusually weak or tired, fainting spells, lightheadedness breathing problems changes in vision chest pain mouth sores nausea and vomiting pain, swelling, redness at site where injected pain, tingling, numbness in the hands or feet redness, swelling, or sores on hands or feet stomach pain unusual bleeding Side effects that usually do not require medical  attention (report to your doctor or health care professional if they continue or are bothersome): changes in finger or toe nails diarrhea dry or itchy skin hair loss headache loss of appetite sensitivity of eyes to the light stomach upset unusually teary eyes This list may not describe all possible side effects. Call your doctor for medical advice about side effects. You may report side effects to FDA at 1-800-FDA-1088. Where should I keep my medication? This drug is given in a hospital or clinic and will not be stored at home. NOTE: This sheet is a summary. It may not cover all possible information. If you have questions about this medicine, talk to your doctor, pharmacist, or health care provider.  2022 Elsevier/Gold Standard (2021-08-05 00:00:00)

## 2021-10-28 NOTE — Progress Notes (Signed)
Pt states that when she has a BM she deals with a burning sensation; also when sitting still she feels like her leg has "it's own heartbeat but it feels like a thobbing sensation."

## 2021-10-28 NOTE — Progress Notes (Signed)
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  She received IV Venofer for that.  I was concerned about malignancy given the degree of anemia and therefore obtain CT abdomen and pelvis with contrast CT showed 6.4 cm enhancing mass in the right liver and another mass measuring 4 cm.  Large volume stool in the right colon transverse and descending colon with irregular mass lesion in the distal sigmoid colon extending into the rectosigmoid junction with lateral extracolonic extension into the pelvic sidewall.  Apparent luminal narrowing at the level of the lesion which accounts for large volume of stool proximally.  Multiple pericolonic and perirectal lymph nodes.  Multiple peritoneal nodules are seen in the lower right pelvic sidewall.  Colon mass appears to be invading posterior uterus.Thrombosis of the superior rectal vein may be bland thrombus although direct tumor invasion not excluded.   Patient had a flexible sigmoidoscopy which showed a large obstructing mass in the rectosigmoid colon.  Scope could not be traversed beyond the obstruction.  Biopsies were taken and were consistent with adenocarcinoma. Patient had a diverting colostomy on 05/01/2021 with Dr. Dahlia Byes   Patient started palliative FOLFOX chemotherapy in June 2022 with plans to add Avastin after 6 cycles of treatment.  Patient developed allergic reaction to oxaliplatin with  cycle 10 as evidenced by diaphoresis vomiting and flushing.  Plan is to hold off on further doses of    Interval history-patient reports occasional burning pain in her rectal area which lasts for a few minutes and resolves on its own.  Also reports that her bilateral feet sometimes have throbbing pain but the symptoms resolve on their own.  Denies any significant nausea or vomiting.  ECOG PS- 1 Pain scale- 0   Review of systems- Review of Systems  Constitutional:  Positive for malaise/fatigue. Negative for chills, fever and weight loss.  HENT:  Negative for congestion, ear discharge and nosebleeds.   Eyes:  Negative for blurred vision.  Respiratory:  Negative for cough, hemoptysis, sputum production, shortness of breath and wheezing.   Cardiovascular:  Negative for chest pain, palpitations, orthopnea and claudication.  Gastrointestinal:  Negative for abdominal pain, blood in stool, constipation, diarrhea, heartburn, melena, nausea and vomiting.  Genitourinary:  Negative for dysuria, flank pain, frequency, hematuria and urgency.  Musculoskeletal:  Negative for back pain, joint pain and myalgias.  Skin:  Negative for rash.  Neurological:  Negative for dizziness, tingling, focal weakness, seizures, weakness and headaches.  Endo/Heme/Allergies:  Does not bruise/bleed easily.  Psychiatric/Behavioral:  Negative for depression and suicidal ideas. The patient does not have insomnia.      Allergies  Allergen Reactions   Oxaliplatin Nausea And Vomiting     Past Medical History:  Diagnosis Date   Allergy    Anemia      Past Surgical History:  Procedure Laterality Date   FLEXIBLE SIGMOIDOSCOPY N/A 04/24/2021   Procedure: FLEXIBLE SIGMOIDOSCOPY;  Surgeon: [name], MD;  Location: [facility] ENDOSCOPY;  Service: Gastroenterology;  Laterality: N/A;  PORTACATH PLACEMENT N/A 05/01/2021   Procedure: INSERTION PORT-A-CATH;  Surgeon: Jules Husbands, MD;  Location: [facility] ORS;  Service: General;   Laterality: N/A;    Social History   Socioeconomic History   Marital status: Married    Spouse name: Not on file   Number of children: Not on file   Years of education: Not on file   Highest education level: Not on file  Occupational History   Not on file  Tobacco Use   Smoking status: Never   Smokeless tobacco: Never  Vaping Use   Vaping Use: Never used  Substance and Sexual Activity   Alcohol use: Not Currently   Drug use: Never   Sexual activity: Not Currently  Other Topics Concern   Not on file  Social History Narrative   Not on file   Social Determinants of Health   Financial Resource Strain: Not on file  Food Insecurity: Not on file  Transportation Needs: Not on file  Physical Activity: Not on file  Stress: Not on file  Social Connections: Not on file  Intimate Partner Violence: Not on file    Family History  Problem Relation Age of Onset   Stroke Mother    Hypertension Mother    Cancer Father      Current Outpatient Medications:    folic acid (FOLVITE) 1 MG tablet, Take 1 tablet (1 mg total) by mouth daily., Disp: 30 tablet, Rfl: 3   oxycodone (OXY-IR) 5 MG capsule, Take 5 mg by mouth every 4 (four) hours as needed., Disp: , Rfl:    potassium chloride SA (KLOR-CON) 20 MEQ tablet, Take 1 tablet (20 mEq total) by mouth daily., Disp: 21 tablet, Rfl: 0   ibuprofen (ADVIL) 600 MG tablet, Take 1 tablet (600 mg total) by mouth every 6 (six) hours as needed. (Patient not taking: Reported on 09/30/2021), Disp: 30 tablet, Rfl: 0   lidocaine-prilocaine (EMLA) cream, Apply 1 application topically as needed., Disp: 30 g, Rfl: 2 No current facility-administered medications for this visit.  Facility-Administered Medications Ordered in Other Visits:    fluorouracil (ADRUCIL) 4,100 mg in sodium chloride 0.9 % 68 mL chemo infusion, 2,400 mg/m2 (Treatment Plan Recorded), Intravenous, 1 day or 1 dose, Sindy Guadeloupe, MD   fluorouracil (ADRUCIL) chemo injection 700 mg, 400  mg/m2 (Treatment Plan Recorded), Intravenous, Once, Sindy Guadeloupe, MD   leucovorin 700 mg in dextrose 5 % 250 mL infusion, 700 mg, Intravenous, Once, Sindy Guadeloupe, MD, Last Rate: 570 mL/hr at 10/28/21 1207, 700 mg at 10/28/21 1207   sodium chloride flush (NS) 0.9 % injection 10 mL, 10 mL, Intracatheter, PRN, Sindy Guadeloupe, MD  Physical exam:  Vitals:   10/28/21 1002  BP: 126/87  Pulse: 91  Resp: 16  Temp: 97.6 F (36.4 C)  SpO2: 100%  Weight: 129 lb 14.4 oz (58.9 kg)   Physical Exam Constitutional:      General: She is not in acute distress. Cardiovascular:     Rate and Rhythm: Normal rate and regular rhythm.     Heart sounds: Normal heart sounds.  Pulmonary:     Effort: Pulmonary effort is normal.     Breath sounds: Normal breath sounds.  Abdominal:     General: Bowel sounds are normal.     Palpations: Abdomen is soft.     Comments: Colostomy in place  Skin:    General: Skin is warm and dry.  Neurological:     Mental Status: She is alert  and oriented to person, place, and time.     CMP Latest Ref Rng & Units 10/28/2021  Glucose 70 - 99 mg/dL 91  BUN 8 - 23 mg/dL 10  Creatinine 0.44 - 1.00 mg/dL 0.65  Sodium 135 - 145 mmol/L 136  Potassium 3.5 - 5.1 mmol/L 3.8  Chloride 98 - 111 mmol/L 105  CO2 22 - 32 mmol/L 23  Calcium 8.9 - 10.3 mg/dL 8.8(L)  Total Protein 6.5 - 8.1 g/dL 7.1  Total Bilirubin 0.3 - 1.2 mg/dL 0.5  Alkaline Phos 38 - 126 U/L 79  AST 15 - 41 U/L 21  ALT 0 - 44 U/L 12   CBC Latest Ref Rng & Units 10/28/2021  WBC 4.0 - 10.5 K/uL 4.0  Hemoglobin 12.0 - 15.0 g/dL 9.9(L)  Hematocrit 36.0 - 46.0 % 31.7(L)  Platelets 150 - 400 K/uL 144(L)     Assessment and plan- Patient is a 65 y.o. 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  She is here for on treatment assessment prior to cycle 11 of FOLFOX bevacizumab chemotherapy  Given that patient had a reaction to oxaliplatin after cycle 10 and  we will hold off on giving her any further oxaliplatin at this time.  Counts otherwise okay to proceed with cycle 11 of 5-FU and bevacizumab chemotherapy today and pump disconnect on day 3.  She will be seen in 2 weeks by covering NP for cycle 11 and I will see her back in 4 weeks for cycle 12.  Plan is to repeat scans prior to cycle 12.  Urine protein and blood pressure otherwise acceptable to proceed with bevacizumab today   Visit Diagnosis 1. Metastatic colon cancer in female [facility])   2. Encounter for antineoplastic chemotherapy   3. Encounter for monoclonal antibody treatment for malignancy      Dr. Randa Evens, MD, MPH [facility] at [facility] [phone] 10/28/2021 12:23 PM

## 2021-10-30 ENCOUNTER — Other Ambulatory Visit: Payer: Self-pay

## 2021-10-30 ENCOUNTER — Inpatient Hospital Stay: Payer: BC Managed Care – PPO

## 2021-10-30 DIAGNOSIS — C20 Malignant neoplasm of rectum: Secondary | ICD-10-CM | POA: Diagnosis present

## 2021-10-30 DIAGNOSIS — C786 Secondary malignant neoplasm of retroperitoneum and peritoneum: Secondary | ICD-10-CM | POA: Insufficient documentation

## 2021-10-30 DIAGNOSIS — Z452 Encounter for adjustment and management of vascular access device: Secondary | ICD-10-CM | POA: Insufficient documentation

## 2021-10-30 DIAGNOSIS — C787 Secondary malignant neoplasm of liver and intrahepatic bile duct: Secondary | ICD-10-CM | POA: Insufficient documentation

## 2021-10-30 DIAGNOSIS — C187 Malignant neoplasm of sigmoid colon: Secondary | ICD-10-CM | POA: Insufficient documentation

## 2021-10-30 DIAGNOSIS — C189 Malignant neoplasm of colon, unspecified: Secondary | ICD-10-CM

## 2021-10-30 MED ORDER — HEPARIN SOD (PORK) LOCK FLUSH 100 UNIT/ML IV SOLN
500.0000 [IU] | Freq: Once | INTRAVENOUS | Status: AC | PRN
  Administered 2021-10-30: 500 [IU]
  Filled 2021-10-30: qty 5

## 2021-10-30 MED ORDER — SODIUM CHLORIDE 0.9% FLUSH
10.0000 mL | INTRAVENOUS | Status: DC | PRN
  Administered 2021-10-30: 10 mL
  Filled 2021-10-30: qty 10

## 2021-11-11 ENCOUNTER — Telehealth: Payer: Self-pay | Admitting: Oncology

## 2021-11-11 ENCOUNTER — Ambulatory Visit: Payer: BC Managed Care – PPO

## 2021-11-11 ENCOUNTER — Inpatient Hospital Stay: Payer: BC Managed Care – PPO

## 2021-11-11 ENCOUNTER — Inpatient Hospital Stay: Payer: BC Managed Care – PPO | Admitting: Oncology

## 2021-11-11 NOTE — Telephone Encounter (Signed)
Left Vm with patient (mobile has a full mailbox) to let her know about her appointment rescheduled for tomorrow-she missed her infusion this morning.

## 2021-11-11 NOTE — Telephone Encounter (Signed)
FYI this is a Alicia Coleman and she missed anohter appt.

## 2021-11-11 NOTE — Telephone Encounter (Signed)
Pt called and needs to reschedule appt for today. Please give her a call back at [phone]

## 2021-11-12 ENCOUNTER — Telehealth: Payer: Self-pay | Admitting: Oncology

## 2021-11-12 ENCOUNTER — Encounter: Payer: Self-pay | Admitting: Oncology

## 2021-11-12 ENCOUNTER — Other Ambulatory Visit: Payer: Self-pay

## 2021-11-12 ENCOUNTER — Inpatient Hospital Stay: Payer: BC Managed Care – PPO | Attending: Oncology

## 2021-11-12 ENCOUNTER — Inpatient Hospital Stay: Payer: BC Managed Care – PPO

## 2021-11-12 ENCOUNTER — Other Ambulatory Visit: Payer: Self-pay | Admitting: Oncology

## 2021-11-12 ENCOUNTER — Inpatient Hospital Stay (HOSPITAL_BASED_OUTPATIENT_CLINIC_OR_DEPARTMENT_OTHER): Payer: BC Managed Care – PPO | Admitting: Oncology

## 2021-11-12 VITALS — BP 120/87 | HR 94 | Temp 97.9°F | Resp 16 | Ht 66.0 in | Wt 129.8 lb

## 2021-11-12 DIAGNOSIS — C189 Malignant neoplasm of colon, unspecified: Secondary | ICD-10-CM

## 2021-11-12 DIAGNOSIS — Z452 Encounter for adjustment and management of vascular access device: Secondary | ICD-10-CM

## 2021-11-12 DIAGNOSIS — C187 Malignant neoplasm of sigmoid colon: Secondary | ICD-10-CM | POA: Diagnosis not present

## 2021-11-12 LAB — CBC WITH DIFFERENTIAL/PLATELET
Abs Immature Granulocytes: 0.02 10*3/uL (ref 0.00–0.07)
Basophils Absolute: 0.1 10*3/uL (ref 0.0–0.1)
Basophils Relative: 1 %
Eosinophils Absolute: 0.1 10*3/uL (ref 0.0–0.5)
Eosinophils Relative: 1 %
HCT: 31.6 % — ABNORMAL LOW (ref 36.0–46.0)
Hemoglobin: 9.9 g/dL — ABNORMAL LOW (ref 12.0–15.0)
Immature Granulocytes: 0 %
Lymphocytes Relative: 13 %
Lymphs Abs: 0.8 10*3/uL (ref 0.7–4.0)
MCH: 29.6 pg (ref 26.0–34.0)
MCHC: 31.3 g/dL (ref 30.0–36.0)
MCV: 94.6 fL (ref 80.0–100.0)
Monocytes Absolute: 0.7 10*3/uL (ref 0.1–1.0)
Monocytes Relative: 12 %
Neutro Abs: 4.5 10*3/uL (ref 1.7–7.7)
Neutrophils Relative %: 73 %
Platelets: 142 10*3/uL — ABNORMAL LOW (ref 150–400)
RBC: 3.34 MIL/uL — ABNORMAL LOW (ref 3.87–5.11)
RDW: 16.1 % — ABNORMAL HIGH (ref 11.5–15.5)
WBC: 6.1 10*3/uL (ref 4.0–10.5)
nRBC: 0 % (ref 0.0–0.2)

## 2021-11-12 LAB — COMPREHENSIVE METABOLIC PANEL
ALT: 11 U/L (ref 0–44)
AST: 19 U/L (ref 15–41)
Albumin: 3 g/dL — ABNORMAL LOW (ref 3.5–5.0)
Alkaline Phosphatase: 94 U/L (ref 38–126)
Anion gap: 7 (ref 5–15)
BUN: 11 mg/dL (ref 8–23)
CO2: 26 mmol/L (ref 22–32)
Calcium: 8.9 mg/dL (ref 8.9–10.3)
Chloride: 103 mmol/L (ref 98–111)
Creatinine, Ser: 0.66 mg/dL (ref 0.44–1.00)
GFR, Estimated: >60 mL/min (ref >60)
Glucose, Bld: 95 mg/dL (ref 70–99)
Potassium: 3.7 mmol/L (ref 3.5–5.1)
Sodium: 136 mmol/L (ref 135–145)
Total Bilirubin: 0.5 mg/dL (ref 0.3–1.2)
Total Protein: 6.8 g/dL (ref 6.5–8.1)

## 2021-11-12 LAB — PROTEIN, URINE, RANDOM: Total Protein, Urine: 9 mg/dL

## 2021-11-12 IMAGING — RF DG ABDOMEN 1V
1 series · 15 of 24 positions shown · non-contrast
Comparison: None.

CLINICAL DATA: Missing needle.  Port-A-Cath insertion.

EXAM:
ABDOMEN - 1 VIEW; DG C-ARM 1-60 MIN; DG C-ARM 1-60 MIN-NO REPORT

[Series 1: dg or · 0.14mm/px · 15 of 26 slices shown]
[im 1/26]
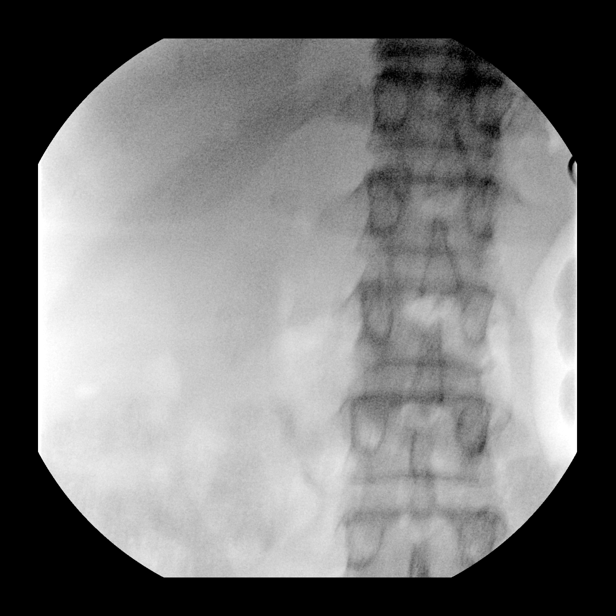
[im 3/26]
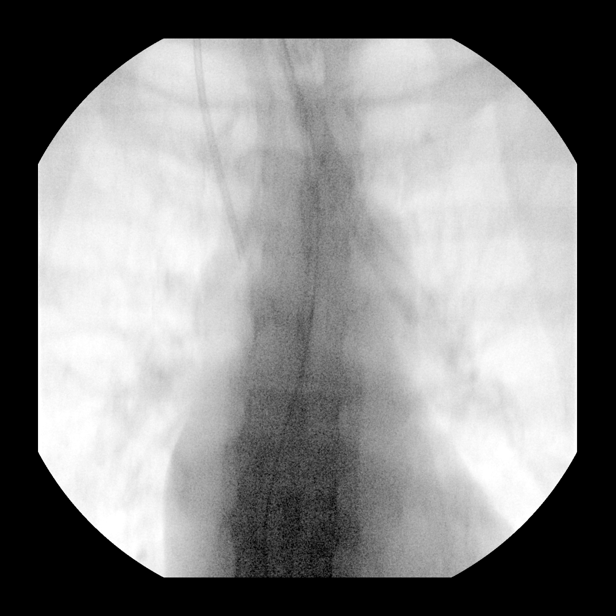
[im 5/26]
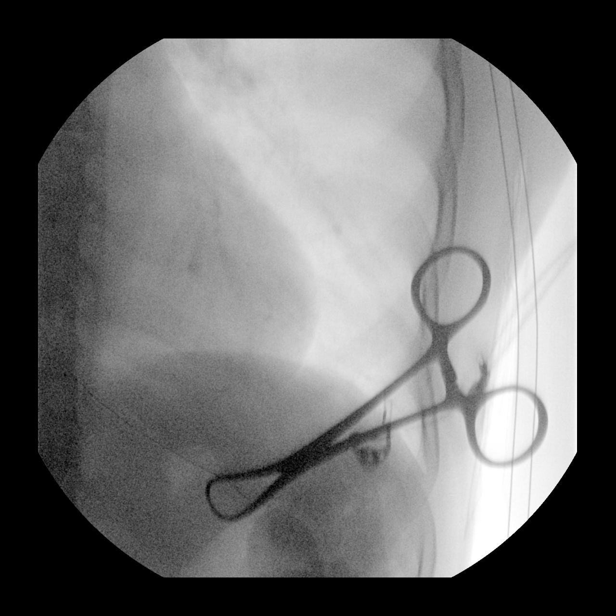
[im 6/26]
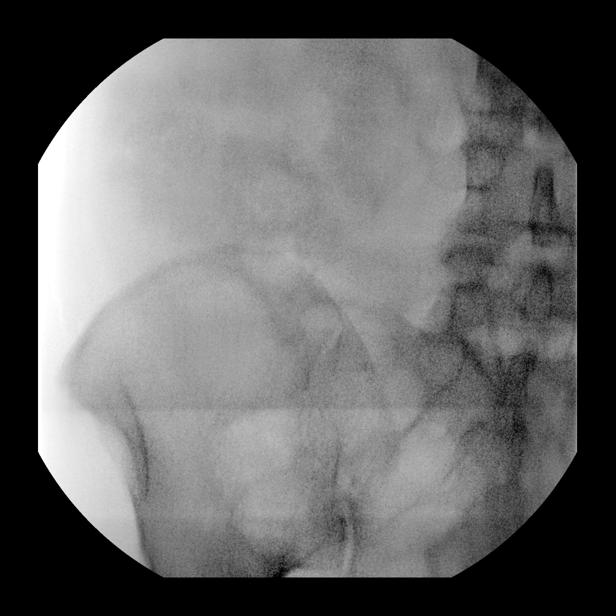
[im 8/26]
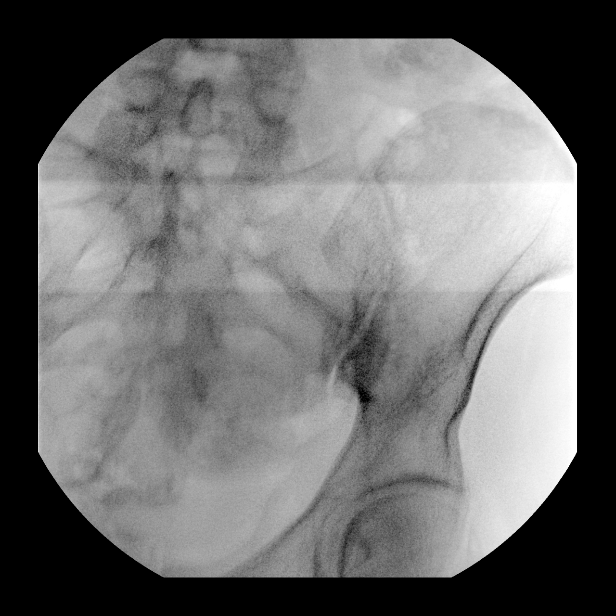
[im 9/26]
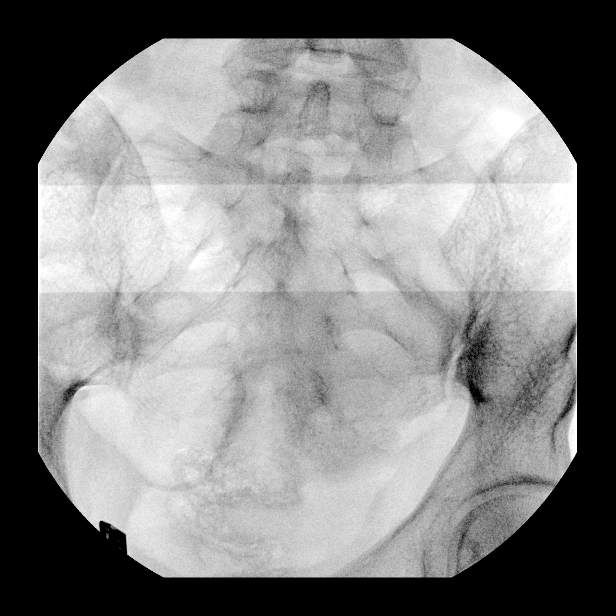
[im 11/26]
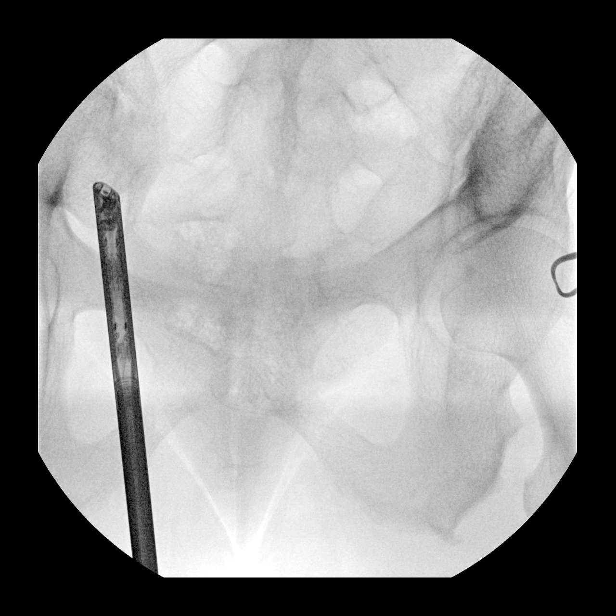
[im 14/26]
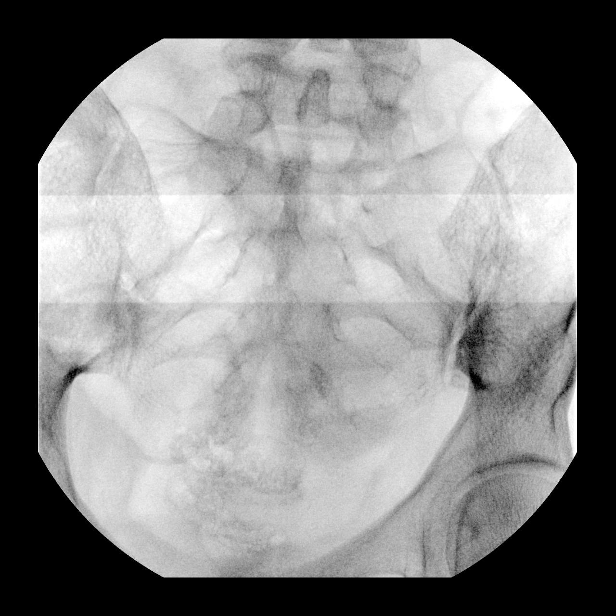
[im 15/26]
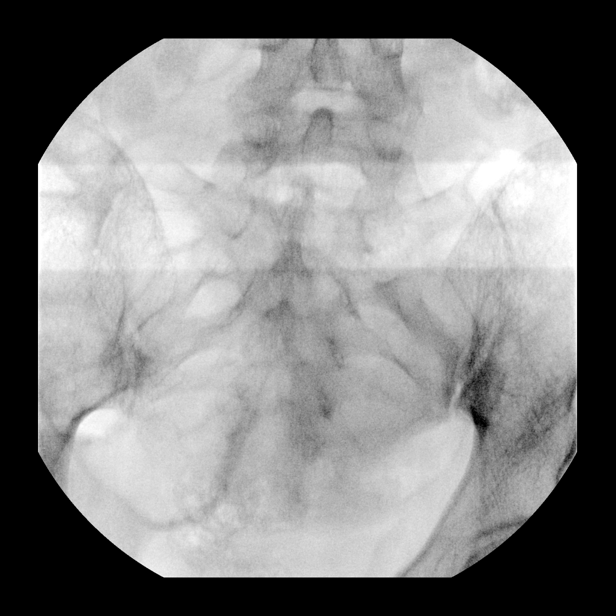
[im 17/26]
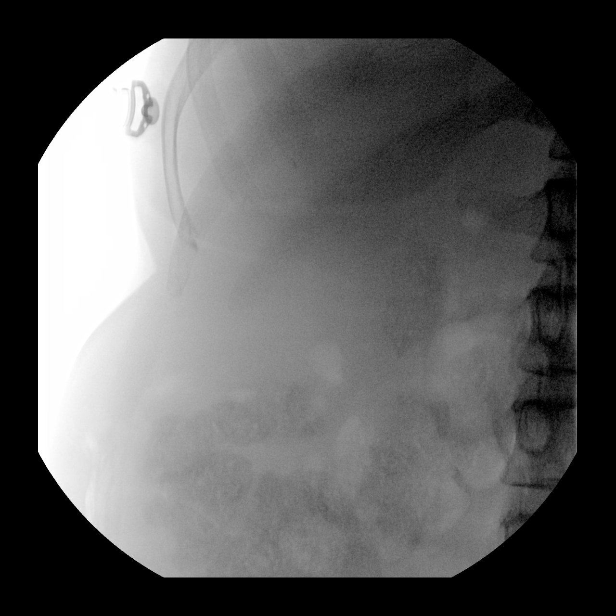
[im 18/26]
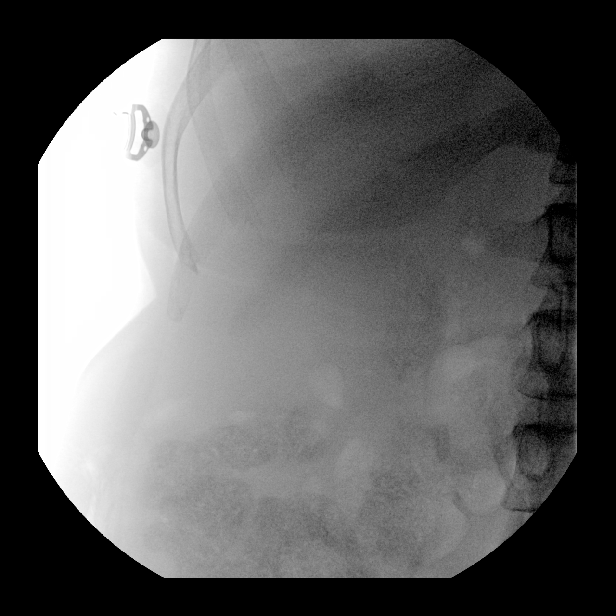
[im 20/26]
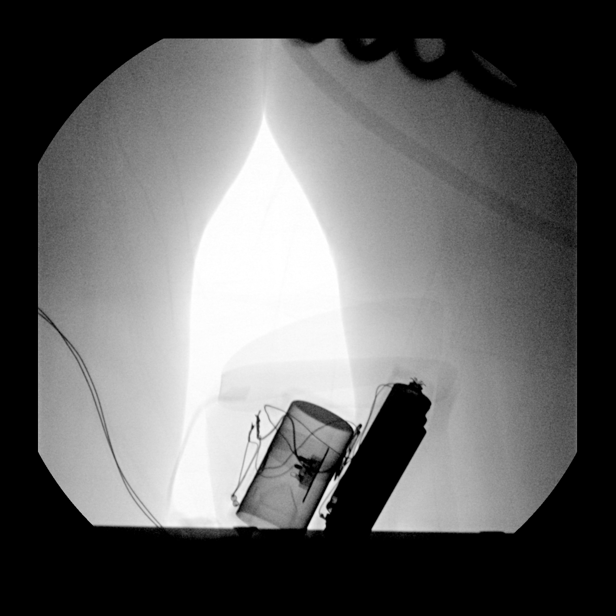
[im 22/26]
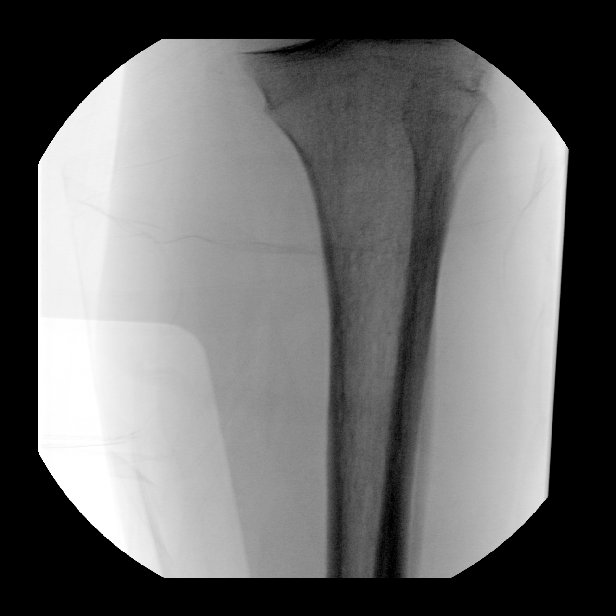
[im 23/26]
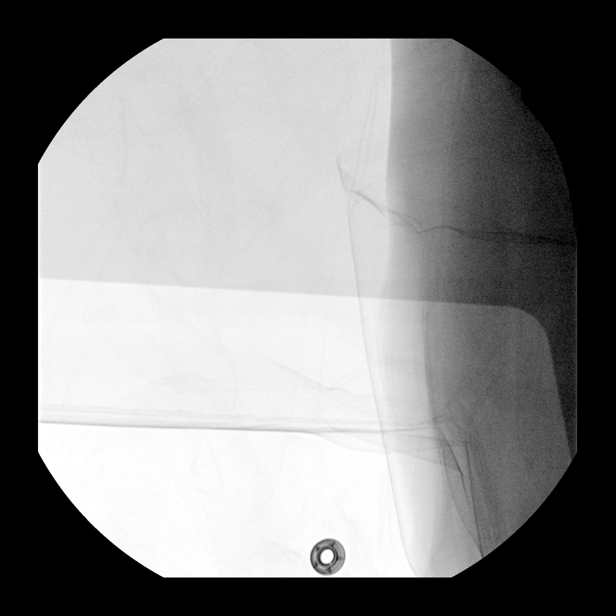
[im 26/26]
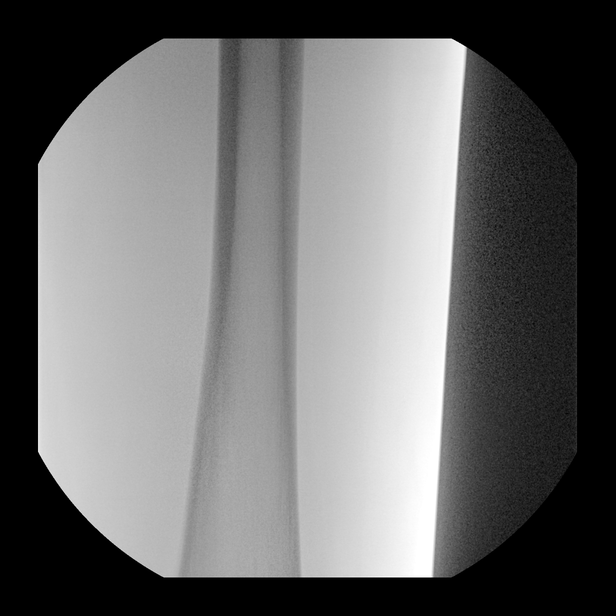

[15 of 24 positions shown; findings below may reference images not displayed]

FINDINGS: Multiple C-arm images were obtained of the abdomen, pelvis, chest,
and thigh region bilaterally. There is also an image of the tibia
and fibula which is not marked as for laterality.

Most of the images are degraded by significant motion which would
decrease detection of a small needle.

Port-A-Cath is in place in the SVC. NG tube in place. Towel clamp is
present overlying the left groin region. Surgical instrument
overlying the right pelvis.
IMPRESSION: Numerous images were obtained however are degraded by significant
motion. This decreases the sensitivity for detection of needle. No
retained needle is identified.

## 2021-11-12 MED ORDER — HEPARIN SOD (PORK) LOCK FLUSH 100 UNIT/ML IV SOLN
INTRAVENOUS | Status: AC
  Administered 2021-11-12: 14:00:00 500 [IU] via INTRAVENOUS
  Filled 2021-11-12: qty 5

## 2021-11-12 MED ORDER — ALTEPLASE 2 MG IJ SOLR
2.0000 mg | Freq: Once | INTRAMUSCULAR | Status: AC | PRN
  Administered 2021-11-12: 11:00:00 2 mg
  Filled 2021-11-12: qty 2

## 2021-11-12 MED ORDER — HEPARIN SOD (PORK) LOCK FLUSH 100 UNIT/ML IV SOLN
500.0000 [IU] | Freq: Once | INTRAVENOUS | Status: AC
  Filled 2021-11-12: qty 5

## 2021-11-12 MED ORDER — SODIUM CHLORIDE 0.9% FLUSH
10.0000 mL | Freq: Once | INTRAVENOUS | Status: AC
  Administered 2021-11-12: 14:00:00 10 mL via INTRAVENOUS
  Filled 2021-11-12: qty 10

## 2021-11-12 NOTE — Progress Notes (Signed)
Multiple flushes and attempts were made to receive blood return from port and was unsuccessful. Port flushes with ease. Per pt she does not experience any pain or discomfort when port is being flushed and no swelling or redness noted. NP and treatment team notified. Per NP to proceed with cathflo. Cathflo was instilled at 1105. After the first 30 minutes no blood return was noted. Cathflo dwelled for 90 more minutes per policy. After a total of 120 minutes a slight blood return was noted but RN was not able to withdraw any blood from port. NP made aware. Per NP to schedule dye study for pt. Dye studied scheduled for 11/13/21 and chemotherapy treatment rescheduled. New appointments and AVS given to patient. Pt verbalized understanding of upcoming appointments. Port deaccessed at 1430. Pt stable for discharge.   Kyle Luppino [employer]

## 2021-11-12 NOTE — Telephone Encounter (Signed)
Left Vm with patient to let her know about rescheduled time for her infusion on Monday 12/19. Spoke to her today to let her know to be expecting a call.

## 2021-11-12 NOTE — Progress Notes (Signed)
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  She received IV Venofer for that.  I was concerned about malignancy given the degree of anemia and therefore obtain CT abdomen and pelvis with contrast CT showed 6.4 cm enhancing mass in the right liver and another mass measuring 4 cm.  Large volume stool in the right colon transverse and descending colon with irregular mass lesion in the distal sigmoid colon extending into the rectosigmoid junction with lateral extracolonic extension into the pelvic sidewall.  Apparent luminal narrowing at the level of the lesion which accounts for large volume of stool proximally.  Multiple pericolonic and perirectal lymph nodes.  Multiple peritoneal nodules are seen in the lower right pelvic sidewall.  Colon mass appears to be invading posterior uterus.Thrombosis of the superior rectal vein may be bland thrombus although direct tumor invasion not excluded.   Patient had a flexible sigmoidoscopy which showed a large obstructing mass in the rectosigmoid colon.  Scope could not be traversed beyond the obstruction.  Biopsies were taken and were consistent with adenocarcinoma. Patient had a diverting colostomy on 05/01/2021 with Dr. Dahlia Byes   Patient started palliative FOLFOX chemotherapy in June 2022 with plans to add Avastin after 6 cycles of treatment.   Patient developed allergic reaction to oxaliplatin with cycle 10 as evidenced by diaphoresis vomiting and flushing.  Plan is to hold off on further doses of  Interval history-patient reports persistent burning pain in her rectal area which last for few minutes and resolves on its own.  Also having bilateral foot numbness that is intermittent and resolve spontaneously.  She continues to work full-time energy level is good.  She drinks water all day is focused on increasing her protein to try and gain weight.  Denies any new concerns.  ECOG PS- 1 Pain scale- 0   Review of systems- Review of Systems  Constitutional:  Positive for malaise/fatigue. Negative for chills, fever and weight loss.  HENT:  Negative for congestion, ear discharge and nosebleeds.   Eyes:  Negative for blurred vision.  Respiratory:  Negative for cough, hemoptysis, sputum production, shortness of breath and wheezing.   Cardiovascular:  Negative for chest pain, palpitations, orthopnea and claudication.  Gastrointestinal:  Negative for abdominal pain, blood in stool, constipation, diarrhea, heartburn, melena, nausea and vomiting.  Genitourinary:  Negative for dysuria, flank pain, frequency, hematuria and urgency.  Musculoskeletal:  Negative for back pain, joint pain and myalgias.  Skin:  Negative for rash.  Neurological:  Negative for dizziness, tingling, focal weakness, seizures, weakness and headaches.  Endo/Heme/Allergies:  Does not bruise/bleed easily.  Psychiatric/Behavioral:  Negative for depression and suicidal ideas. The patient does not have insomnia.      Allergies  Allergen Reactions   Oxaliplatin Nausea And Vomiting     Past Medical History:  Diagnosis Date   Allergy    Anemia      Past Surgical History:  Procedure Laterality Date  FLEXIBLE SIGMOIDOSCOPY N/A 04/24/2021   Procedure: FLEXIBLE SIGMOIDOSCOPY;  Surgeon: [name], MD;  Location: [facility] ENDOSCOPY;  Service: Gastroenterology;  Laterality:  N/A;   PORTACATH PLACEMENT N/A 05/01/2021   Procedure: INSERTION PORT-A-CATH;  Surgeon: Jules Husbands, MD;  Location: [facility] ORS;  Service: General;  Laterality: N/A;    Social History   Socioeconomic History   Marital status: Married    Spouse name: Not on file   Number of children: Not on file   Years of education: Not on file   Highest education level: Not on file  Occupational History   Not on file  Tobacco Use   Smoking status: Never   Smokeless tobacco: Never  Vaping Use   Vaping Use: Never used  Substance and Sexual Activity   Alcohol use: Not Currently   Drug use: Never   Sexual activity: Not Currently  Other Topics Concern   Not on file  Social History Narrative   Not on file   Social Determinants of Health   Financial Resource Strain: Not on file  Food Insecurity: Not on file  Transportation Needs: Not on file  Physical Activity: Not on file  Stress: Not on file  Social Connections: Not on file  Intimate Partner Violence: Not on file    Family History  Problem Relation Age of Onset   Stroke Mother    Hypertension Mother    Cancer Father      Current Outpatient Medications:    folic acid (FOLVITE) 1 MG tablet, Take 1 tablet (1 mg total) by mouth daily., Disp: 30 tablet, Rfl: 3   ibuprofen (ADVIL) 600 MG tablet, Take 1 tablet (600 mg total) by mouth every 6 (six) hours as needed. (Patient not taking: Reported on 09/30/2021), Disp: 30 tablet, Rfl: 0   lidocaine-prilocaine (EMLA) cream, Apply 1 application topically as needed., Disp: 30 g, Rfl: 2   oxycodone (OXY-IR) 5 MG capsule, Take 5 mg by mouth every 4 (four) hours as needed., Disp: , Rfl:    potassium chloride SA (KLOR-CON) 20 MEQ tablet, Take 1 tablet (20 mEq total) by mouth daily., Disp: 21 tablet, Rfl: 0  Physical exam:  Vitals:   11/12/21 0950  Weight: 129 lb 12.8 oz (58.9 kg)  Height: 5\' 6"  (1.676 m)   Physical Exam Constitutional:      Appearance: Normal appearance.  HENT:     Head:  Normocephalic and atraumatic.  Eyes:     Pupils: Pupils are equal, round, and reactive to light.  Cardiovascular:     Rate and Rhythm: Normal rate and regular rhythm.     Heart sounds: Normal heart sounds. No murmur heard. Pulmonary:     Effort: Pulmonary effort is normal.     Breath sounds: Normal breath sounds. No wheezing.  Abdominal:     General: Bowel sounds are normal. There is no distension.     Palpations: Abdomen is soft.     Tenderness: There is no abdominal tenderness.     Comments: Colostomy  Musculoskeletal:        General: Normal range of motion.     Cervical back: Normal range of motion.  Skin:    General: Skin is warm and dry.     Findings: No rash.  Neurological:     Mental Status: She is alert and oriented to person, place, and time.  Psychiatric:        Judgment: Judgment normal.     CMP Latest Ref Rng & Units 10/28/2021  Glucose  70 - 99 mg/dL 91  BUN 8 - 23 mg/dL 10  Creatinine 0.44 - 1.00 mg/dL 0.65  Sodium 135 - 145 mmol/L 136  Potassium 3.5 - 5.1 mmol/L 3.8  Chloride 98 - 111 mmol/L 105  CO2 22 - 32 mmol/L 23  Calcium 8.9 - 10.3 mg/dL 8.8(L)  Total Protein 6.5 - 8.1 g/dL 7.1  Total Bilirubin 0.3 - 1.2 mg/dL 0.5  Alkaline Phos 38 - 126 U/L 79  AST 15 - 41 U/L 21  ALT 0 - 44 U/L 12   CBC Latest Ref Rng & Units 11/12/2021  WBC 4.0 - 10.5 K/uL 6.1  Hemoglobin 12.0 - 15.0 g/dL 9.9(L)  Hematocrit 36.0 - 46.0 % 31.6(L)  Platelets 150 - 400 K/uL 142(L)     Assessment and plan- Patient is a 65 y.o. 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  She is here for treatment assessment prior to cycle 12 of 5-FU bevacizumab chemotherapy.  Had reaction to oxaliplatin after cycle 10 through treatment change from FOLFOX to 5-FU.  Reviewed labs with patient which are acceptable for treatment.  Proceed with chemo today and return to clinic in 2 days for pump disconnect.  Return to clinic as scheduled to  see Dr. Janese Banks in 2 weeks.   Chemo induced anemia- Hemoglobin stable at 9.9 today.  Monitor.  Mild thrombocytopenia- Platelet count 142,000.  No evidence of bleeding.  Appetite change- Weight is down 5 pounds since mid November.  She has been trying to drink at least 1-2 Ensure/boost daily.  States they do not taste great but she is doing the best that she can.  She is increasing her protein and focused on trying to maintain if not gaining weight.  Disposition- Treatment today. RTC in 2 weeks for scheduled follow-up with Dr. Janese Banks and cycle 13 of 5-FU bevacizumab.  Addendum- Unable to get blood return had to have peripheral lab draw.  Administered Cathflo.  We will see if blood return is noted prior to using port for chemo today.  If not we will need to check placement prior to administration.  I spent 20 minutes dedicated to the care of this patient (face-to-face and non-face-to-face) on the date of the encounter to include what is described in the assessment and plan.  Visit Diagnosis No diagnosis found.  Faythe Casa, NP 11/12/2021 11:38 AM

## 2021-11-13 ENCOUNTER — Ambulatory Visit
Admission: RE | Admit: 2021-11-13 | Discharge: 2021-11-13 | Disposition: A | Discharge status: Home / Self Care | Payer: BC Managed Care – PPO | Source: Ambulatory Visit | Attending: Oncology | Admitting: Oncology

## 2021-11-13 ENCOUNTER — Inpatient Hospital Stay: Payer: BC Managed Care – PPO

## 2021-11-13 ENCOUNTER — Ambulatory Visit: Payer: BC Managed Care – PPO | Admitting: Radiology

## 2021-11-13 DIAGNOSIS — Z452 Encounter for adjustment and management of vascular access device: Secondary | ICD-10-CM

## 2021-11-13 DIAGNOSIS — Z85038 Personal history of other malignant neoplasm of large intestine: Secondary | ICD-10-CM | POA: Diagnosis not present

## 2021-11-13 DIAGNOSIS — Y838 Other surgical procedures as the cause of abnormal reaction of the patient, or of later complication, without mention of misadventure at the time of the procedure: Secondary | ICD-10-CM | POA: Diagnosis not present

## 2021-11-13 DIAGNOSIS — T82524A Displacement of infusion catheter, initial encounter: Secondary | ICD-10-CM | POA: Insufficient documentation

## 2021-11-13 HISTORY — PX: IR CV LINE INJECTION: IMG2294

## 2021-11-13 MED ORDER — HEPARIN SOD (PORK) LOCK FLUSH 100 UNIT/ML IV SOLN
INTRAVENOUS | Status: AC
  Administered 2021-11-13: 500 [IU]
  Filled 2021-11-13: qty 5

## 2021-11-13 MED ORDER — IOHEXOL 350 MG/ML SOLN
10.0000 mL | Freq: Once | INTRAVENOUS | Status: AC | PRN
  Administered 2021-11-13: 10 mL
  Filled 2021-11-13: qty 10

## 2021-11-13 NOTE — Procedures (Signed)
Pre Procedure Dx: Malfunctioning port Post Procedural Dx: Same  Malpositioned port a catheter with tip terminating within the superior aspect of the SVC with development of a significant fibrin sheath.    As the patient states she is continuing chemotherapy for the foreseeable future, definitive Port revision is advised.    Per patient request, Port revision will be scheduled with interventional radiology for mid next week.  Ronny Bacon, MD Pager #: [phone]

## 2021-11-14 ENCOUNTER — Inpatient Hospital Stay: Payer: BC Managed Care – PPO

## 2021-11-17 ENCOUNTER — Other Ambulatory Visit: Payer: Self-pay | Admitting: Oncology

## 2021-11-17 ENCOUNTER — Other Ambulatory Visit: Payer: Self-pay | Admitting: Radiology

## 2021-11-17 ENCOUNTER — Other Ambulatory Visit: Payer: Self-pay | Admitting: *Deleted

## 2021-11-17 ENCOUNTER — Inpatient Hospital Stay: Payer: BC Managed Care – PPO

## 2021-11-17 DIAGNOSIS — Z452 Encounter for adjustment and management of vascular access device: Secondary | ICD-10-CM

## 2021-11-17 DIAGNOSIS — C189 Malignant neoplasm of colon, unspecified: Secondary | ICD-10-CM

## 2021-11-17 NOTE — Progress Notes (Signed)
Orders for pain med

## 2021-11-18 ENCOUNTER — Ambulatory Visit: Admission: RE | Admit: 2021-11-18 | Payer: BC Managed Care – PPO | Source: Ambulatory Visit

## 2021-11-18 ENCOUNTER — Other Ambulatory Visit: Payer: Self-pay | Admitting: Radiology

## 2021-11-19 ENCOUNTER — Ambulatory Visit
Admission: RE | Admit: 2021-11-19 | Discharge: 2021-11-19 | Disposition: A | Discharge status: Home / Self Care | Payer: BC Managed Care – PPO | Source: Ambulatory Visit | Attending: Oncology | Admitting: Oncology

## 2021-11-19 ENCOUNTER — Inpatient Hospital Stay: Payer: BC Managed Care – PPO

## 2021-11-19 ENCOUNTER — Other Ambulatory Visit: Payer: Self-pay

## 2021-11-19 ENCOUNTER — Other Ambulatory Visit: Payer: Self-pay | Admitting: Oncology

## 2021-11-19 ENCOUNTER — Encounter: Payer: Self-pay | Admitting: Radiology

## 2021-11-19 DIAGNOSIS — Z452 Encounter for adjustment and management of vascular access device: Secondary | ICD-10-CM

## 2021-11-19 DIAGNOSIS — C189 Malignant neoplasm of colon, unspecified: Secondary | ICD-10-CM | POA: Insufficient documentation

## 2021-11-19 HISTORY — PX: IR IMAGING GUIDED PORT INSERTION: IMG5740

## 2021-11-19 HISTORY — PX: IR REMOVAL TUN ACCESS W/ PORT W/O FL MOD SED: IMG2290

## 2021-11-19 HISTORY — DX: Malignant (primary) neoplasm, unspecified: C80.1

## 2021-11-19 MED ORDER — FENTANYL CITRATE (PF) 100 MCG/2ML IJ SOLN
INTRAMUSCULAR | Status: DC | PRN
  Administered 2021-11-19 (×2): 50 ug via INTRAVENOUS

## 2021-11-19 MED ORDER — FENTANYL CITRATE (PF) 100 MCG/2ML IJ SOLN
INTRAMUSCULAR | Status: AC
  Filled 2021-11-19: qty 2

## 2021-11-19 MED ORDER — MIDAZOLAM HCL 2 MG/2ML IJ SOLN
INTRAMUSCULAR | Status: DC | PRN
  Administered 2021-11-19 (×2): 1 mg via INTRAVENOUS

## 2021-11-19 MED ORDER — LIDOCAINE-EPINEPHRINE 1 %-1:100000 IJ SOLN
INTRAMUSCULAR | Status: AC
  Administered 2021-11-19: 11:00:00 15 mL
  Filled 2021-11-19: qty 1

## 2021-11-19 MED ORDER — MIDAZOLAM HCL 2 MG/2ML IJ SOLN
INTRAMUSCULAR | Status: AC
  Filled 2021-11-19: qty 2

## 2021-11-19 MED ORDER — SODIUM CHLORIDE 0.9 % IV SOLN
INTRAVENOUS | Status: DC
  Filled 2021-11-19: qty 1000

## 2021-11-19 MED ORDER — HEPARIN SOD (PORK) LOCK FLUSH 100 UNIT/ML IV SOLN
INTRAVENOUS | Status: AC
  Administered 2021-11-19: 11:00:00 500 [IU]
  Filled 2021-11-19: qty 5

## 2021-11-19 NOTE — H&P (Signed)
Chief Complaint: Patient was seen in consultation today for malfunctioning port at the request of [place]  Referring Physician(s): Burns,Jennifer E  Supervising Physician: Mir, Sharen Heck  Patient Status: [facility] - Out-pt  History of Present Illness: Alicia Coleman is a 65 y.o. female with colorectal cancer receiving chemotherapy via Right IJ port a catheter placed by Dr. Dahlia Byes 04/2021. The port was malfunctioning with no blood return and patient presented to IR for a port check on 12/15. The port check revealed a malpositioned port a catheter with tip terminating within the superior aspect of the SVC with development of a significant fibrin sheath. We recommended port revision. Patient presents today for existing port removal and new image guided port placement with moderate sedation.  The patient denies any current chest pain or shortness of breath. She denies any current blood thinner use, denies any known bleeding or clotting disorder. The patient denies any recent infections, fever or chills. The patient denies any history of sleep apnea or chronic oxygen use. She has no known complications to sedation.    Past Medical History:  Diagnosis Date   Allergy    Anemia    Cancer [facility])     Past Surgical History:  Procedure Laterality Date   FLEXIBLE SIGMOIDOSCOPY N/A 04/24/2021   Procedure: FLEXIBLE SIGMOIDOSCOPY;  Surgeon: [name], MD;  Location: [facility] ENDOSCOPY;  Service: Gastroenterology;  Laterality: N/A;   IR CV LINE INJECTION  11/13/2021   PORTACATH PLACEMENT N/A 05/01/2021   Procedure: INSERTION PORT-A-CATH;  Surgeon: Jules Husbands, MD;  Location: [facility] ORS;  Service: General;  Laterality: N/A;    Allergies: Oxaliplatin  Medications: Prior to Admission medications   Medication Sig Start Date End Date Taking? Authorizing Provider  folic acid (FOLVITE) 1 MG tablet Take 1 tablet (1 mg total) by mouth daily. Patient not taking: Reported on 11/12/2021 05/19/21   Sindy Guadeloupe, MD  ibuprofen (ADVIL) 600 MG tablet Take 1 tablet (600 mg total) by mouth every 6 (six) hours as needed. Patient not taking: Reported on 09/30/2021 05/02/21   Tylene Fantasia, PA-C  lidocaine-prilocaine (EMLA) cream Apply 1 application topically as needed. 10/28/21   Sindy Guadeloupe, MD  oxycodone (OXY-IR) 5 MG capsule Take 5 mg by mouth every 4 (four) hours as needed. Patient not taking: Reported on 11/12/2021    [provider]  potassium chloride SA (KLOR-CON) 20 MEQ tablet Take 1 tablet (20 mEq total) by mouth daily. Patient not taking: Reported on 11/12/2021 10/14/21   Sindy Guadeloupe, MD     Family History  Problem Relation Age of Onset   Stroke Mother    Hypertension Mother    Cancer Father     Social History   Socioeconomic History   Marital status: Married    Spouse name: Not on file   Number of children: Not on file   Years of education: Not on file   Highest education level: Not on file  Occupational History   Not on file  Tobacco Use   Smoking status: Never   Smokeless tobacco: Never  Vaping Use   Vaping Use: Never used  Substance and Sexual Activity   Alcohol use: Not Currently   Drug use: Never   Sexual activity: Not Currently  Other Topics Concern   Not on file  Social History Narrative   Not on file   Social Determinants of Health   Financial Resource Strain: Not on file  Food Insecurity: Not on file  Transportation  Needs: Not on file  Physical Activity: Not on file  Stress: Not on file  Social Connections: Not on file    Review of Systems: A 12 point ROS discussed and pertinent positives are indicated in the HPI above.  All other systems are negative.  Review of Systems  Vital Signs: BP 115/82    Pulse 80    Temp 98.3 F (36.8 C) (Oral)    Resp 13    Wt 129 lb 13.6 oz (58.9 kg)    LMP  (LMP Unknown)    SpO2 100%    BMI 20.96 kg/m   Physical Exam Constitutional:      Appearance: Normal appearance.  HENT:     Head:  Normocephalic and atraumatic.  Cardiovascular:     Rate and Rhythm: Normal rate and regular rhythm.  Pulmonary:     Effort: Pulmonary effort is normal. No respiratory distress.     Comments: Right chest wall port intact with bandage over site. Skin:    General: Skin is warm and dry.  Neurological:     Mental Status: She is alert and oriented to person, place, and time.    Imaging: IR CV Line Injection  Result Date: 11/13/2021 INDICATION: History of colorectal cancer, now with malfunctioning surgically placed port a Catheter. EXAM: FLUOROSCOPIC GUIDED PORT A CATHETER CHECK COMPARISON:  Chest CT-08/21/2021 MEDICATIONS: None. CONTRAST:  None FLUOROSCOPY TIME:  6 seconds (0.4 mGy) COMPLICATIONS: None immediate. TECHNIQUE: The procedure, risks, benefits, and alternatives were explained to the patient and informed written consent was obtained. A timeout was performed prior to the initiation of the procedure. The patient's chest port a catheter was accessed by the IV team. The patient was placed supine on the fluoroscopy table. A preprocedural spot fluoroscopic image was obtained of the chest in existing port a catheter. Note was made of difficulty aspirating blood from the port a catheter. Contrast was injected via the Port a catheter and images were reviewed. The Port a catheter was flushed with a heparin dwell and de accessed. A dressing was applied. The patient tolerated the procedure well without immediate postprocedural complication. FINDINGS: Preprocedural spot fluoroscopic image demonstrates malpositioning of the right jugular approach port a catheter with tip projected over the expected location of the superior aspect of the SVC, near the confluence of the internal jugular vein. Contrast injection development of a significant fibrin sheath about the Port a catheter tip. There is no evidence of catheter kink or fracture though note is made of markedly redundant tubing at the level of the base of the  neck prior to the right jugular venotomy site. IMPRESSION: Malpositioned port a catheter with tip terminating within the superior aspect of the SVC with development of a significant fibrin sheath. As the patient states she is continuing chemotherapy for the foreseeable future, definitive Port revision is advised. Per patient request, Port revision will be scheduled with interventional radiology for mid next week. Electronically Signed   By: Sandi Mariscal M.D.   On: 11/13/2021 17:26    Labs:  CBC: Recent Labs    09/30/21 0843 10/14/21 0835 10/28/21 0948 11/12/21 0919  WBC 4.3 4.1 4.0 6.1  HGB 10.0* 9.2* 9.9* 9.9*  HCT 31.0* 28.8* 31.7* 31.6*  PLT 147* 115* 144* 142*    COAGS: No results for input(s): INR, APTT in the last 8760 hours.  BMP: Recent Labs    09/30/21 0843 10/14/21 0835 10/28/21 0948 11/12/21 0919  NA 137 136 136 136  K 3.5 3.3*  3.8 3.7  CL 104 104 105 103  CO2 27 25 23 26   GLUCOSE 88 74 91 95  BUN 11 9 10 11   CALCIUM 8.9 8.4* 8.8* 8.9  CREATININE 0.59 0.59 0.65 0.66  GFRNONAA >60 >60 >60 >60    LIVER FUNCTION TESTS: Recent Labs    09/30/21 0843 10/14/21 0835 10/28/21 0948 11/12/21 0919  BILITOT 0.5 0.2* 0.5 0.5  AST 19 20 21 19   ALT 13 11 12 11   ALKPHOS 81 75 79 94  PROT 6.9 6.8 7.1 6.8  ALBUMIN 3.0* 2.8* 3.1* 3.0*    Assessment and Plan: 65 year old female with colorectal cancer receiving chemotherapy via Right IJ port a catheter placed by Dr. Dahlia Byes 04/2021. The port was malfunctioning with no blood return and patient presented to IR for a port check on 12/15. The port check revealed a malpositioned port a catheter with tip terminating within the superior aspect of the SVC with development of a significant fibrin sheath. We recommended port revision. Patient presents today for existing port removal and new image guided port placement with moderate sedation.  The patient has been NPO, no blood thinners taken, imaging, labs and vitals have been  reviewed.  Risks and benefits of image guided port-a-catheter placement was discussed with the patient including, but not limited to bleeding, infection, damage to adjacent structures or fibrin sheath development and need for additional procedures.  All of the patient's questions were answered, patient is agreeable to proceed. Consent signed and in chart.  Thank you for this interesting consult.  I greatly enjoyed meeting Anam Bobby and look forward to participating in their care.  A copy of this report was sent to the requesting provider on this date.  Electronically Signed: Hedy Jacob, PA-C 11/19/2021, 9:40 AM   I spent a total of 15 Minutes in face to face in clinical consultation, greater than 50% of which was counseling/coordinating care for colorectal cancer.

## 2021-11-19 NOTE — Progress Notes (Signed)
Patient clinically stable post Port placement left, with right chest port removal. Tolerated well. Vitals stable pre and post procedure. Report given to [facility] post procedure/specials. Received Versed 2 mg along with Fentanyl 100 mcg IV for procedure.

## 2021-11-19 NOTE — Procedures (Signed)
Interventional Radiology Procedure Note  Procedure: 1. Left chest port insertion. 2. Right chest port removal  Indication: Malpositioned right chest port  Findings: Please refer to procedural dictation for full description.  Complications: None  EBL: < 10 mL  Miachel Roux, MD [phone]

## 2021-11-26 ENCOUNTER — Other Ambulatory Visit: Payer: BC Managed Care – PPO

## 2021-11-26 ENCOUNTER — Ambulatory Visit: Payer: BC Managed Care – PPO

## 2021-11-26 ENCOUNTER — Ambulatory Visit: Payer: BC Managed Care – PPO | Admitting: Oncology

## 2021-12-03 ENCOUNTER — Inpatient Hospital Stay: Payer: BC Managed Care – PPO | Attending: Oncology

## 2021-12-03 ENCOUNTER — Inpatient Hospital Stay: Payer: BC Managed Care – PPO

## 2021-12-03 ENCOUNTER — Other Ambulatory Visit: Payer: Self-pay

## 2021-12-03 ENCOUNTER — Encounter: Payer: Self-pay | Admitting: Oncology

## 2021-12-03 ENCOUNTER — Inpatient Hospital Stay (HOSPITAL_BASED_OUTPATIENT_CLINIC_OR_DEPARTMENT_OTHER): Payer: BC Managed Care – PPO | Admitting: Oncology

## 2021-12-03 VITALS — BP 132/88 | HR 82 | Temp 98.0°F | Resp 18 | Wt 129.0 lb

## 2021-12-03 DIAGNOSIS — C187 Malignant neoplasm of sigmoid colon: Secondary | ICD-10-CM | POA: Diagnosis present

## 2021-12-03 DIAGNOSIS — E538 Deficiency of other specified B group vitamins: Secondary | ICD-10-CM | POA: Diagnosis not present

## 2021-12-03 DIAGNOSIS — E876 Hypokalemia: Secondary | ICD-10-CM | POA: Diagnosis not present

## 2021-12-03 DIAGNOSIS — C189 Malignant neoplasm of colon, unspecified: Secondary | ICD-10-CM

## 2021-12-03 DIAGNOSIS — C786 Secondary malignant neoplasm of retroperitoneum and peritoneum: Secondary | ICD-10-CM | POA: Insufficient documentation

## 2021-12-03 DIAGNOSIS — Z5112 Encounter for antineoplastic immunotherapy: Secondary | ICD-10-CM | POA: Insufficient documentation

## 2021-12-03 DIAGNOSIS — Z452 Encounter for adjustment and management of vascular access device: Secondary | ICD-10-CM | POA: Diagnosis not present

## 2021-12-03 DIAGNOSIS — C20 Malignant neoplasm of rectum: Secondary | ICD-10-CM | POA: Insufficient documentation

## 2021-12-03 DIAGNOSIS — Z5111 Encounter for antineoplastic chemotherapy: Secondary | ICD-10-CM | POA: Insufficient documentation

## 2021-12-03 DIAGNOSIS — C787 Secondary malignant neoplasm of liver and intrahepatic bile duct: Secondary | ICD-10-CM | POA: Insufficient documentation

## 2021-12-03 LAB — COMPREHENSIVE METABOLIC PANEL
ALT: 13 U/L (ref 0–44)
AST: 21 U/L (ref 15–41)
Albumin: 2.8 g/dL — ABNORMAL LOW (ref 3.5–5.0)
Alkaline Phosphatase: 75 U/L (ref 38–126)
Anion gap: 5 (ref 5–15)
BUN: 9 mg/dL (ref 8–23)
CO2: 24 mmol/L (ref 22–32)
Calcium: 8.8 mg/dL — ABNORMAL LOW (ref 8.9–10.3)
Chloride: 105 mmol/L (ref 98–111)
Creatinine, Ser: 0.49 mg/dL (ref 0.44–1.00)
GFR, Estimated: >60 mL/min (ref >60)
Glucose, Bld: 95 mg/dL (ref 70–99)
Potassium: 3.1 mmol/L — ABNORMAL LOW (ref 3.5–5.1)
Sodium: 134 mmol/L — ABNORMAL LOW (ref 135–145)
Total Bilirubin: 0.1 mg/dL — ABNORMAL LOW (ref 0.3–1.2)
Total Protein: 7.2 g/dL (ref 6.5–8.1)

## 2021-12-03 LAB — CBC WITH DIFFERENTIAL/PLATELET
Abs Immature Granulocytes: 0.03 10*3/uL (ref 0.00–0.07)
Basophils Absolute: 0.1 10*3/uL (ref 0.0–0.1)
Basophils Relative: 1 %
Eosinophils Absolute: 0.4 10*3/uL (ref 0.0–0.5)
Eosinophils Relative: 5 %
HCT: 30 % — ABNORMAL LOW (ref 36.0–46.0)
Hemoglobin: 9.5 g/dL — ABNORMAL LOW (ref 12.0–15.0)
Immature Granulocytes: 0 %
Lymphocytes Relative: 16 %
Lymphs Abs: 1.3 10*3/uL (ref 0.7–4.0)
MCH: 29.1 pg (ref 26.0–34.0)
MCHC: 31.7 g/dL (ref 30.0–36.0)
MCV: 92 fL (ref 80.0–100.0)
Monocytes Absolute: 0.7 10*3/uL (ref 0.1–1.0)
Monocytes Relative: 9 %
Neutro Abs: 5.2 10*3/uL (ref 1.7–7.7)
Neutrophils Relative %: 69 %
Platelets: 199 10*3/uL (ref 150–400)
RBC: 3.26 MIL/uL — ABNORMAL LOW (ref 3.87–5.11)
RDW: 14.4 % (ref 11.5–15.5)
WBC: 7.6 10*3/uL (ref 4.0–10.5)
nRBC: 0 % (ref 0.0–0.2)

## 2021-12-03 LAB — PROTEIN, URINE, RANDOM: Total Protein, Urine: 8 mg/dL

## 2021-12-03 MED ORDER — LEUCOVORIN CALCIUM INJECTION 350 MG
700.0000 mg | Freq: Once | INTRAVENOUS | Status: AC
  Administered 2021-12-03: 700 mg via INTRAVENOUS
  Filled 2021-12-03: qty 35

## 2021-12-03 MED ORDER — SODIUM CHLORIDE 0.9 % IV SOLN
Freq: Once | INTRAVENOUS | Status: AC
  Filled 2021-12-03: qty 250

## 2021-12-03 MED ORDER — SODIUM CHLORIDE 0.9 % IV SOLN
10.0000 mg | Freq: Once | INTRAVENOUS | Status: AC
  Administered 2021-12-03: 10 mg via INTRAVENOUS
  Filled 2021-12-03: qty 10

## 2021-12-03 MED ORDER — SODIUM CHLORIDE 0.9 % IV SOLN
2400.0000 mg/m2 | INTRAVENOUS | Status: DC
  Administered 2021-12-03: 4100 mg via INTRAVENOUS
  Filled 2021-12-03: qty 82

## 2021-12-03 MED ORDER — DEXTROSE 5 % IV SOLN
Freq: Once | INTRAVENOUS | Status: DC
  Filled 2021-12-03: qty 250

## 2021-12-03 MED ORDER — POTASSIUM CHLORIDE 20 MEQ/100ML IV SOLN
20.0000 meq | Freq: Once | INTRAVENOUS | Status: AC
  Administered 2021-12-03: 20 meq via INTRAVENOUS

## 2021-12-03 MED ORDER — LIDOCAINE-PRILOCAINE 2.5-2.5 % EX CREA: 1.0000 "application " | TOPICAL_CREAM | CUTANEOUS | 2 refills | Status: DC | PRN

## 2021-12-03 MED ORDER — FLUOROURACIL CHEMO INJECTION 2.5 GM/50ML
400.0000 mg/m2 | Freq: Once | INTRAVENOUS | Status: AC
  Administered 2021-12-03: 700 mg via INTRAVENOUS
  Filled 2021-12-03: qty 14

## 2021-12-03 MED ORDER — POTASSIUM CHLORIDE CRYS ER 20 MEQ PO TBCR: 20.0000 meq | EXTENDED_RELEASE_TABLET | Freq: Every day | ORAL | 0 refills | Status: DC

## 2021-12-03 MED ORDER — SODIUM CHLORIDE 0.9 % IV SOLN
5.0000 mg/kg | Freq: Once | INTRAVENOUS | Status: AC
  Administered 2021-12-03: 300 mg via INTRAVENOUS
  Filled 2021-12-03: qty 12

## 2021-12-03 NOTE — Progress Notes (Signed)
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  She received IV Venofer for that.  I was concerned about malignancy given the degree of anemia and therefore obtain CT abdomen and pelvis with contrast CT showed 6.4 cm enhancing mass in the right liver and another mass measuring 4 cm.  Large volume stool in the right colon transverse and descending colon with irregular mass lesion in the distal sigmoid colon extending into the rectosigmoid junction with lateral extracolonic extension into the pelvic sidewall.  Apparent luminal narrowing at the level of the lesion which accounts for large volume of stool proximally.  Multiple pericolonic and perirectal lymph nodes.  Multiple peritoneal nodules are seen in the lower right pelvic sidewall.  Colon mass appears to be invading posterior uterus.Thrombosis of the superior rectal vein may be bland thrombus although direct tumor invasion not excluded.   Patient had a flexible sigmoidoscopy which showed a large obstructing mass in the rectosigmoid colon.  Scope could not be traversed beyond the obstruction.  Biopsies were taken and were consistent with adenocarcinoma. Patient had a diverting colostomy on 05/01/2021 with Dr. Dahlia Byes   Patient started palliative FOLFOX chemotherapy in June 2022 with plans to add Avastin after 6 cycles of treatment.   Patient developed allergic reaction to oxaliplatin with cycle 10 as evidenced by diaphoresis vomiting and flushing.  Plan is to hold off on further doses of oxaliplatin.    Interval history-patient had issues with right-sided chest wall port which had to be ultimately taken out and she now has a new left chest wall port in place.  She reports doing well overall.  Denies any significant pain.  Oral intake is fair.  ECOG PS- 1 Pain scale- 0   Review of systems- Review of Systems  Constitutional:  Negative for chills, fever, malaise/fatigue and weight loss.  HENT:  Negative for congestion, ear discharge and nosebleeds.   Eyes:  Negative for blurred vision.  Respiratory:  Negative for cough, hemoptysis, sputum production, shortness of breath and wheezing.   Cardiovascular:  Negative for chest pain, palpitations, orthopnea and claudication.  Gastrointestinal:  Negative for abdominal pain, blood in stool, constipation, diarrhea, heartburn, melena, nausea and vomiting.  Genitourinary:  Negative for dysuria, flank pain, frequency, hematuria and urgency.  Musculoskeletal:  Negative for back pain, joint pain and myalgias.  Skin:  Negative for rash.  Neurological:  Negative for dizziness, tingling, focal weakness, seizures, weakness and headaches.  Endo/Heme/Allergies:  Does not bruise/bleed easily.  Psychiatric/Behavioral:  Negative for depression and suicidal ideas. The patient does not have insomnia.      Allergies  Allergen Reactions   Oxaliplatin Nausea And Vomiting     Past Medical History:  Diagnosis Date   Allergy    Anemia    Cancer [facility]      Past Surgical History:  Procedure Laterality Date   FLEXIBLE SIGMOIDOSCOPY N/A 04/24/2021   Procedure: FLEXIBLE SIGMOIDOSCOPY;  Surgeon: Vicente Males,  Bailey Mech, MD;  Location: [place];  Service: Gastroenterology;  Laterality: N/A;   IR CV LINE INJECTION  11/13/2021   IR IMAGING GUIDED PORT INSERTION  11/19/2021   IR REMOVAL TUN ACCESS W/  PORT W/O FL MOD SED  11/19/2021   PORTACATH PLACEMENT N/A 05/01/2021   Procedure: INSERTION PORT-A-CATH;  Surgeon: Jules Husbands, MD;  Location: [facility];  Service: General;  Laterality: N/A;    Social History   Socioeconomic History   Marital status: Married    Spouse name: Not on file   Number of children: Not on file   Years of education: Not on file   Highest education level: Not on file  Occupational History   Not on file  Tobacco Use   Smoking status: Never   Smokeless tobacco: Never  Vaping Use   Vaping Use: Never used  Substance and Sexual Activity   Alcohol use: Not Currently   Drug use: Never   Sexual activity: Not Currently  Other Topics Concern   Not on file  Social History Narrative   Not on file   Social Determinants of Health   Financial Resource Strain: Not on file  Food Insecurity: Not on file  Transportation Needs: Not on file  Physical Activity: Not on file  Stress: Not on file  Social Connections: Not on file  Intimate Partner Violence: Not on file    Family History  Problem Relation Age of Onset   Stroke Mother    Hypertension Mother    Cancer Father      Current Outpatient Medications:    acetaminophen (TYLENOL) 325 MG tablet, Take 650 mg by mouth every 6 (six) hours as needed., Disp: , Rfl:    folic acid (FOLVITE) 1 MG tablet, Take 1 tablet (1 mg total) by mouth daily. (Patient not taking: Reported on 11/12/2021), Disp: 30 tablet, Rfl: 3   ibuprofen (ADVIL) 600 MG tablet, Take 1 tablet (600 mg total) by mouth every 6 (six) hours as needed. (Patient not taking: Reported on 09/30/2021), Disp: 30 tablet, Rfl: 0   lidocaine-prilocaine (EMLA) cream, Apply 1 application topically as needed., Disp: 30 g, Rfl: 2   oxycodone (OXY-IR) 5 MG capsule, Take 5 mg by mouth every 4 (four) hours as needed. (Patient not taking: Reported on 11/12/2021), Disp: , Rfl:    potassium chloride SA (KLOR-CON M) 20 MEQ tablet, Take 1 tablet (20 mEq total) by mouth  daily., Disp: 21 tablet, Rfl: 0 No current facility-administered medications for this visit.  Facility-Administered Medications Ordered in Other Visits:    dextrose 5 % solution, , Intravenous, Once, Sindy Guadeloupe, MD   fluorouracil (ADRUCIL) 4,100 mg in sodium chloride 0.9 % 68 mL chemo infusion, 2,400 mg/m2 (Treatment Plan Recorded), Intravenous, 1 day or 1 dose, Sindy Guadeloupe, MD   fluorouracil (ADRUCIL) chemo injection 700 mg, 400 mg/m2 (Treatment Plan Recorded), Intravenous, Once, Sindy Guadeloupe, MD  Physical exam:  Vitals:   12/03/21 0944  BP: 132/88  Pulse: 82  Resp: 18  Temp: 98 F (36.7 C)  SpO2: 100%  Weight: 129 lb (58.5 kg)   Physical Exam Constitutional:      General: She is not in acute distress. Cardiovascular:     Rate and Rhythm: Normal rate and regular rhythm.     Heart sounds: Normal heart sounds.  Pulmonary:     Effort: Pulmonary effort is normal.     Breath sounds: Normal breath sounds.  Abdominal:     General: Bowel sounds  are normal.     Palpations: Abdomen is soft.     Comments: Diverting colostomy in place  Skin:    General: Skin is warm and dry.  Neurological:     Mental Status: She is alert and oriented to person, place, and time.     CMP Latest Ref Rng & Units 12/03/2021  Glucose 70 - 99 mg/dL 95  BUN 8 - 23 mg/dL 9  Creatinine 0.44 - 1.00 mg/dL 0.49  Sodium 135 - 145 mmol/L 134(L)  Potassium 3.5 - 5.1 mmol/L 3.1(L)  Chloride 98 - 111 mmol/L 105  CO2 22 - 32 mmol/L 24  Calcium 8.9 - 10.3 mg/dL 8.8(L)  Total Protein 6.5 - 8.1 g/dL 7.2  Total Bilirubin 0.3 - 1.2 mg/dL 0.1(L)  Alkaline Phos 38 - 126 U/L 75  AST 15 - 41 U/L 21  ALT 0 - 44 U/L 13   CBC Latest Ref Rng & Units 12/03/2021  WBC 4.0 - 10.5 K/uL 7.6  Hemoglobin 12.0 - 15.0 g/dL 9.5(L)  Hematocrit 36.0 - 46.0 % 30.0(L)  Platelets 150 - 400 K/uL 199    No images are attached to the encounter.  IR REMOVAL TUN ACCESS W/ PORT W/O FL MOD SED  Result Date:  11/19/2021 INDICATION: 66 year old woman with history of colorectal malignancy presents to IR for removal of malposition non aspirating right chest port and placement of new left chest port. EXAM: REMOVAL RIGHT IJ VEIN PORT-A-CATH MEDICATIONS: None. ANESTHESIA/SEDATION: None FLUOROSCOPY TIME:  None COMPLICATIONS: None immediate. PROCEDURE: Informed written consent was obtained from the patient after a thorough discussion of the procedural risks, benefits and alternatives. All questions were addressed. Maximal Sterile Barrier Technique was utilized including caps, mask, sterile gowns, sterile gloves, sterile drape, hand hygiene and skin antiseptic. A timeout was performed prior to the initiation of the procedure. A timeout was performed prior to the initiation of the procedure. Patient positioned supine on the angiography table. Right neck and anterior upper chest prepped and draped in the usual sterile fashion. All elements of maximal sterile barrier were utilized including, cap, mask, sterile gown, sterile gloves, large sterile drape, hand scrubbing and 2% Chlorhexidine for skin cleaning. The right internal jugular vein was evaluated with ultrasound and shown to be patent. A permanent ultrasound image was obtained and placed in the patient's medical record. Local anesthesia was provided with 1% lidocaine with epinephrine. Using sterile gel and a sterile probe cover, the right internal jugular vein was entered with a 21 ga needle during real time ultrasound guidance. 0.018 inch guidewire placed and 21 ga needle exchanged for transitional dilator set. Utilizing fluoroscopy, 0.035 inch guidewire advanced through the needle without difficulty. Attention then turned to the right anterior upper chest. Following local lidocaine administration, a port pocket was created. The catheter was connected to the port and brought from the pocket to the venotomy site through a subcutaneous tunnel. The catheter was cut to size and  inserted through the peel-away sheath. The catheter tip was positioned at the cavoatrial junction using fluoroscopic guidance. The port aspirated and flushed well. The port pocket was closed with deep and superficial absorbable suture. The port pocket incision and venotomy sites were also sealed with Dermabond. IMPRESSION: Successful right IJ vein Port-A-Cath explant. Electronically Signed   By: Miachel Roux M.D.   On: 11/19/2021 13:29   IR CV Line Injection  Result Date: 11/13/2021 INDICATION: History of colorectal cancer, now with malfunctioning surgically placed port a Catheter. EXAM: FLUOROSCOPIC GUIDED PORT A CATHETER  CHECK COMPARISON:  Chest CT-08/21/2021 MEDICATIONS: None. CONTRAST:  None FLUOROSCOPY TIME:  6 seconds (0.4 mGy) COMPLICATIONS: None immediate. TECHNIQUE: The procedure, risks, benefits, and alternatives were explained to the patient and informed written consent was obtained. A timeout was performed prior to the initiation of the procedure. The patient's chest port a catheter was accessed by the IV team. The patient was placed supine on the fluoroscopy table. A preprocedural spot fluoroscopic image was obtained of the chest in existing port a catheter. Note was made of difficulty aspirating blood from the port a catheter. Contrast was injected via the Port a catheter and images were reviewed. The Port a catheter was flushed with a heparin dwell and de accessed. A dressing was applied. The patient tolerated the procedure well without immediate postprocedural complication. FINDINGS: Preprocedural spot fluoroscopic image demonstrates malpositioning of the right jugular approach port a catheter with tip projected over the expected location of the superior aspect of the SVC, near the confluence of the internal jugular vein. Contrast injection development of a significant fibrin sheath about the Port a catheter tip. There is no evidence of catheter kink or fracture though note is made of markedly  redundant tubing at the level of the base of the neck prior to the right jugular venotomy site. IMPRESSION: Malpositioned port a catheter with tip terminating within the superior aspect of the SVC with development of a significant fibrin sheath. As the patient states she is continuing chemotherapy for the foreseeable future, definitive Port revision is advised. Per patient request, Port revision will be scheduled with interventional radiology for mid next week. Electronically Signed   By: Sandi Mariscal M.D.   On: 11/13/2021 17:26   IR IMAGING GUIDED PORT INSERTION  Result Date: 11/19/2021 INDICATION: 66 year old woman with history of colorectal malignancy presents to IR for removal of malposition non aspirating right chest port and placement of new left chest port. EXAM: IMPLANTED PORT A CATH PLACEMENT WITH ULTRASOUND AND FLUOROSCOPIC GUIDANCE MEDICATIONS: None ANESTHESIA/SEDATION: Moderate (conscious) sedation was employed during this procedure. A total of Versed 2 mg and Fentanyl 100 mcg was administered intravenously. Moderate Sedation Time: 32 minutes. The patient's level of consciousness and vital signs were monitored continuously by radiology nursing throughout the procedure under my direct supervision. FLUOROSCOPY TIME:  1 minutes, 6 seconds (3.2 mGy) COMPLICATIONS: None immediate. PROCEDURE: The procedure, risks, benefits, and alternatives were explained to the patient. Questions regarding the procedure were encouraged and answered. The patient understands and consents to the procedure. A timeout was performed prior to the initiation of the procedure. Patient positioned supine on the angiography table. Left neck and anterior upper chest prepped and draped in the usual sterile fashion. All elements of maximal sterile barrier were utilized including, cap, mask, sterile gown, sterile gloves, large sterile drape, hand scrubbing and 2% Chlorhexidine for skin cleaning. The left internal jugular vein was  evaluated with ultrasound and shown to be patent. A permanent ultrasound image was obtained and placed in the patient's medical record. Local anesthesia was provided with 1% lidocaine with epinephrine. Using sterile gel and a sterile probe cover, the left internal jugular vein was entered with a 21 ga needle during real time ultrasound guidance. 0.018 inch guidewire placed and 21 ga needle exchanged for transitional dilator set. Utilizing fluoroscopy, 0.035 inch guidewire advanced through the needle without difficulty. Attention then turned to the left anterior upper chest. Following local lidocaine administration, a port pocket was created. The catheter was connected to the port and brought from  the pocket to the venotomy site through a subcutaneous tunnel. The catheter was cut to size and inserted through the peel-away sheath. The catheter tip was positioned at the cavoatrial junction using fluoroscopic guidance. The port aspirated and flushed well. The port pocket was closed with deep and superficial absorbable suture. The port pocket incision and venotomy sites were also sealed with Dermabond. IMPRESSION: Successful placement of a left internal jugular approach power injectable Port-A-Cath. The catheter is ready for immediate use. Electronically Signed   By: Miachel Roux M.D.   On: 11/19/2021 13:28     Assessment and plan- Patient is a 67 y.o. female  with metastatic adenocarcinoma of the sigmoid colon/rectum with peritoneal and liver metastases.  She is here for on treatment assessment prior to cycle 12 of 5-FU bevacizumab chemotherapy  Counts okay to proceed with cycle 12 of 5-FU bevacizumab chemotherapy today with pump disconnect on day 3.  Urine protein remains trace and blood pressure stable to proceed with bevacizumab.  I will see her back in 2 weeks for cycle 13.  She will get CT chest abdomen and pelvis with contrast prior.  Normocytic anemia: Likely secondary to chemotherapy.  Will add anemia  labs at next visit.  Hypokalemia: We will give her 1 L of IV fluids today with 20 mEq of IV potassium and send her home with oral potassium supplements as well   Visit Diagnosis 1. Encounter for care related to Port-a-Cath   2. Metastatic colon cancer in female [facility])   3. B12 deficiency   4. Hypokalemia      Dr. Randa Evens, MD, MPH [facility] [phone] 12/03/2021 12:44 PM

## 2021-12-03 NOTE — Progress Notes (Signed)
Patient here for oncology follow-up appointment, concerns of back pain   

## 2021-12-03 NOTE — Patient Instructions (Signed)
[facility] [place]  Discharge Instructions: Thank you for choosing [facility] to provide your oncology and hematology care.  If you have a lab appointment with the [facility], please go directly to the [facility] and check in at the registration area.  Wear comfortable clothing and clothing appropriate for easy access to any Portacath or PICC line.   We strive to give you quality time with your provider. You may need to reschedule your appointment if you arrive late (15 or more minutes).  Arriving late affects you and other patients whose appointments are after yours.  Also, if you miss three or more appointments without notifying the office, you may be dismissed from the clinic at the providers discretion.      For prescription refill requests, have your pharmacy contact our office and allow 72 hours for refills to be completed.    Today you received the following chemotherapy and/or immunotherapy agents        To help prevent nausea and vomiting after your treatment, we encourage you to take your nausea medication as directed.  BELOW ARE SYMPTOMS THAT SHOULD BE REPORTED IMMEDIATELY: *FEVER GREATER THAN 100.4 F (38 C) OR HIGHER *CHILLS OR SWEATING *NAUSEA AND VOMITING THAT IS NOT CONTROLLED WITH YOUR NAUSEA MEDICATION *UNUSUAL SHORTNESS OF BREATH *UNUSUAL BRUISING OR BLEEDING *URINARY PROBLEMS (pain or burning when urinating, or frequent urination) *BOWEL PROBLEMS (unusual diarrhea, constipation, pain near the anus) TENDERNESS IN MOUTH AND THROAT WITH OR WITHOUT PRESENCE OF ULCERS (sore throat, sores in mouth, or a toothache) UNUSUAL RASH, SWELLING OR PAIN  UNUSUAL VAGINAL DISCHARGE OR ITCHING   Items with * indicate a potential emergency and should be followed up as soon as possible or go to the Emergency Department if any problems should occur.  Please show the CHEMOTHERAPY ALERT CARD or IMMUNOTHERAPY ALERT CARD at check-in to the  Emergency Department and triage nurse.  Should you have questions after your visit or need to cancel or reschedule your appointment, please contact [facility] [place] [facility] [place]  [phone] and follow the prompts.  Office hours are 8:00 a.m. to 4:30 p.m. Monday - Friday. Please note that voicemails left after 4:00 p.m. may not be returned until the following business day.  We are closed weekends and major holidays. You have access to a nurse at all times for urgent questions. Please call the main number to the clinic [phone] and follow the prompts.  For any non-urgent questions, you may also contact your provider using MyChart. We now offer e-Visits for anyone 30 and older to request care online for non-urgent symptoms. For details visit [email or web address].   Also download the MyChart app! Go to the app store, search "MyChart", open the app, select [facility], and log in with your MyChart username and password.  Due to Covid, a mask is required upon entering the hospital/clinic. If you do not have a mask, one will be given to you upon arrival. For doctor visits, patients may have 1 support person aged 34 or older with them. For treatment visits, patients cannot have anyone with them due to current Covid guidelines and our immunocompromised population. Fluorouracil, 5-FU injection What is this medication? FLUOROURACIL, 5-FU (flure oh YOOR a sil) is a chemotherapy drug. It slows the growth of cancer cells. This medicine is used to treat many types of cancer like breast cancer, colon or rectal cancer, pancreatic cancer, and stomach cancer. This medicine may be used for other purposes; ask  your health care provider or pharmacist if you have questions. COMMON BRAND NAME(S): Adrucil What should I tell my care team before I take this medication? They need to know if you have any of these conditions: blood disorders dihydropyrimidine dehydrogenase (DPD) deficiency infection  (especially a virus infection such as chickenpox, cold sores, or herpes) kidney disease liver disease malnourished, poor nutrition recent or ongoing radiation therapy an unusual or allergic reaction to fluorouracil, other chemotherapy, other medicines, foods, dyes, or preservatives pregnant or trying to get pregnant breast-feeding How should I use this medication? This drug is given as an infusion or injection into a vein. It is administered in a hospital or clinic by a specially trained health care professional. Talk to your pediatrician regarding the use of this medicine in children. Special care may be needed. Overdosage: If you think you have taken too much of this medicine contact a poison control center or emergency room at once. NOTE: This medicine is only for you. Do not share this medicine with others. What if I miss a dose? It is important not to miss your dose. Call your doctor or health care professional if you are unable to keep an appointment. What may interact with this medication? Do not take this medicine with any of the following medications: live virus vaccines This medicine may also interact with the following medications: medicines that treat or prevent blood clots like warfarin, enoxaparin, and dalteparin This list may not describe all possible interactions. Give your health care provider a list of all the medicines, herbs, non-prescription drugs, or dietary supplements you use. Also tell them if you smoke, drink alcohol, or use illegal drugs. Some items may interact with your medicine. What should I watch for while using this medication? Visit your doctor for checks on your progress. This drug may make you feel generally unwell. This is not uncommon, as chemotherapy can affect healthy cells as well as cancer cells. Report any side effects. Continue your course of treatment even though you feel ill unless your doctor tells you to stop. In some cases, you may be given  additional medicines to help with side effects. Follow all directions for their use. Call your doctor or health care professional for advice if you get a fever, chills or sore throat, or other symptoms of a cold or flu. Do not treat yourself. This drug decreases your body's ability to fight infections. Try to avoid being around people who are sick. This medicine may increase your risk to bruise or bleed. Call your doctor or health care professional if you notice any unusual bleeding. Be careful brushing and flossing your teeth or using a toothpick because you may get an infection or bleed more easily. If you have any dental work done, tell your dentist you are receiving this medicine. Avoid taking products that contain aspirin, acetaminophen, ibuprofen, naproxen, or ketoprofen unless instructed by your doctor. These medicines may hide a fever. Do not become pregnant while taking this medicine. Women should inform their doctor if they wish to become pregnant or think they might be pregnant. There is a potential for serious side effects to an unborn child. Talk to your health care professional or pharmacist for more information. Do not breast-feed an infant while taking this medicine. Men should inform their doctor if they wish to father a child. This medicine may lower sperm counts. Do not treat diarrhea with over the counter products. Contact your doctor if you have diarrhea that lasts more than 2 days  or if it is severe and watery. This medicine can make you more sensitive to the sun. Keep out of the sun. If you cannot avoid being in the sun, wear protective clothing and use sunscreen. Do not use sun lamps or tanning beds/booths. What side effects may I notice from receiving this medication? Side effects that you should report to your doctor or health care professional as soon as possible: allergic reactions like skin rash, itching or hives, swelling of the face, lips, or tongue low blood counts - this  medicine may decrease the number of white blood cells, red blood cells and platelets. You may be at increased risk for infections and bleeding. signs of infection - fever or chills, cough, sore throat, pain or difficulty passing urine signs of decreased platelets or bleeding - bruising, pinpoint red spots on the skin, black, tarry stools, blood in the urine signs of decreased red blood cells - unusually weak or tired, fainting spells, lightheadedness breathing problems changes in vision chest pain mouth sores nausea and vomiting pain, swelling, redness at site where injected pain, tingling, numbness in the hands or feet redness, swelling, or sores on hands or feet stomach pain unusual bleeding Side effects that usually do not require medical attention (report to your doctor or health care professional if they continue or are bothersome): changes in finger or toe nails diarrhea dry or itchy skin hair loss headache loss of appetite sensitivity of eyes to the light stomach upset unusually teary eyes This list may not describe all possible side effects. Call your doctor for medical advice about side effects. You may report side effects to FDA at 1-800-FDA-1088. Where should I keep my medication? This drug is given in a hospital or clinic and will not be stored at home. NOTE: This sheet is a summary. It may not cover all possible information. If you have questions about this medicine, talk to your doctor, pharmacist, or health care provider.  2022 Elsevier/Gold Standard (2021-08-05 00:00:00) Bevacizumab injection What is this medication? BEVACIZUMAB (be va SIZ yoo mab) is a monoclonal antibody. It is used to treat many types of cancer. This medicine may be used for other purposes; ask your health care provider or pharmacist if you have questions. COMMON BRAND NAME(S): Alymsys, Avastin, MVASI, Noah Charon What should I tell my care team before I take this medication? They need to know if  you have any of these conditions: diabetes heart disease high blood pressure history of coughing up blood prior anthracycline chemotherapy (e.g., doxorubicin, daunorubicin, epirubicin) recent or ongoing radiation therapy recent or planning to have surgery stroke an unusual or allergic reaction to bevacizumab, hamster proteins, mouse proteins, other medicines, foods, dyes, or preservatives pregnant or trying to get pregnant breast-feeding How should I use this medication? This medicine is for infusion into a vein. It is given by a health care professional in a hospital or clinic setting. Talk to your pediatrician regarding the use of this medicine in children. Special care may be needed. Overdosage: If you think you have taken too much of this medicine contact a poison control center or emergency room at once. NOTE: This medicine is only for you. Do not share this medicine with others. What if I miss a dose? It is important not to miss your dose. Call your doctor or health care professional if you are unable to keep an appointment. What may interact with this medication? Interactions are not expected. This list may not describe all possible interactions. Give your health  care provider a list of all the medicines, herbs, non-prescription drugs, or dietary supplements you use. Also tell them if you smoke, drink alcohol, or use illegal drugs. Some items may interact with your medicine. What should I watch for while using this medication? Your condition will be monitored carefully while you are receiving this medicine. You will need important blood work and urine testing done while you are taking this medicine. This medicine may increase your risk to bruise or bleed. Call your doctor or health care professional if you notice any unusual bleeding. Before having surgery, talk to your health care provider to make sure it is ok. This drug can increase the risk of poor healing of your surgical site or  wound. You will need to stop this drug for 28 days before surgery. After surgery, wait at least 28 days before restarting this drug. Make sure the surgical site or wound is healed enough before restarting this drug. Talk to your health care provider if questions. Do not become pregnant while taking this medicine or for 6 months after stopping it. Women should inform their doctor if they wish to become pregnant or think they might be pregnant. There is a potential for serious side effects to an unborn child. Talk to your health care professional or pharmacist for more information. Do not breast-feed an infant while taking this medicine and for 6 months after the last dose. This medicine has caused ovarian failure in some women. This medicine may interfere with the ability to have a child. You should talk to your doctor or health care professional if you are concerned about your fertility. What side effects may I notice from receiving this medication? Side effects that you should report to your doctor or health care professional as soon as possible: allergic reactions like skin rash, itching or hives, swelling of the face, lips, or tongue chest pain or chest tightness chills coughing up blood high fever seizures severe constipation signs and symptoms of bleeding such as bloody or black, tarry stools; red or dark-brown urine; spitting up blood or brown material that looks like coffee grounds; red spots on the skin; unusual bruising or bleeding from the eye, gums, or nose signs and symptoms of a blood clot such as breathing problems; chest pain; severe, sudden headache; pain, swelling, warmth in the leg signs and symptoms of a stroke like changes in vision; confusion; trouble speaking or understanding; severe headaches; sudden numbness or weakness of the face, arm or leg; trouble walking; dizziness; loss of balance or coordination stomach pain sweating swelling of legs or ankles vomiting weight  gain Side effects that usually do not require medical attention (report to your doctor or health care professional if they continue or are bothersome): back pain changes in taste decreased appetite dry skin nausea tiredness This list may not describe all possible side effects. Call your doctor for medical advice about side effects. You may report side effects to FDA at 1-800-FDA-1088. Where should I keep my medication? This drug is given in a hospital or clinic and will not be stored at home. NOTE: This sheet is a summary. It may not cover all possible information. If you have questions about this medicine, talk to your doctor, pharmacist, or health care provider.  2022 Elsevier/Gold Standard (2021-08-05 00:00:00)

## 2021-12-04 LAB — CEA: CEA: 122 ng/mL — ABNORMAL HIGH (ref 0.0–4.7)

## 2021-12-05 ENCOUNTER — Inpatient Hospital Stay: Payer: BC Managed Care – PPO

## 2021-12-05 ENCOUNTER — Other Ambulatory Visit: Payer: Self-pay

## 2021-12-05 DIAGNOSIS — Z5112 Encounter for antineoplastic immunotherapy: Secondary | ICD-10-CM | POA: Diagnosis not present

## 2021-12-05 DIAGNOSIS — C189 Malignant neoplasm of colon, unspecified: Secondary | ICD-10-CM

## 2021-12-05 MED ORDER — HEPARIN SOD (PORK) LOCK FLUSH 100 UNIT/ML IV SOLN
INTRAVENOUS | Status: AC
  Filled 2021-12-05: qty 5

## 2021-12-05 MED ORDER — HEPARIN SOD (PORK) LOCK FLUSH 100 UNIT/ML IV SOLN
500.0000 [IU] | Freq: Once | INTRAVENOUS | Status: AC | PRN
  Administered 2021-12-05: 500 [IU]
  Filled 2021-12-05: qty 5

## 2021-12-05 MED ORDER — SODIUM CHLORIDE 0.9% FLUSH
10.0000 mL | INTRAVENOUS | Status: DC | PRN
  Administered 2021-12-05: 10 mL
  Filled 2021-12-05: qty 10

## 2021-12-10 ENCOUNTER — Other Ambulatory Visit: Payer: Self-pay | Admitting: *Deleted

## 2021-12-10 DIAGNOSIS — D509 Iron deficiency anemia, unspecified: Secondary | ICD-10-CM

## 2021-12-15 ENCOUNTER — Ambulatory Visit
Admission: RE | Admit: 2021-12-15 | Discharge: 2021-12-15 | Disposition: A | Discharge status: Home / Self Care | Payer: BC Managed Care – PPO | Source: Ambulatory Visit | Attending: Oncology | Admitting: Oncology

## 2021-12-15 ENCOUNTER — Other Ambulatory Visit: Payer: Self-pay

## 2021-12-15 DIAGNOSIS — C189 Malignant neoplasm of colon, unspecified: Secondary | ICD-10-CM | POA: Insufficient documentation

## 2021-12-15 MED ORDER — IOHEXOL 300 MG/ML  SOLN
100.0000 mL | Freq: Once | INTRAMUSCULAR | Status: AC | PRN
  Administered 2021-12-15: 100 mL via INTRAVENOUS

## 2021-12-15 MED ORDER — IOHEXOL 350 MG/ML SOLN: 100.0000 mL | Freq: Once | INTRAVENOUS | Status: DC | PRN

## 2021-12-17 ENCOUNTER — Inpatient Hospital Stay: Payer: BC Managed Care – PPO

## 2021-12-17 ENCOUNTER — Other Ambulatory Visit: Payer: Self-pay

## 2021-12-17 ENCOUNTER — Inpatient Hospital Stay (HOSPITAL_BASED_OUTPATIENT_CLINIC_OR_DEPARTMENT_OTHER): Payer: BC Managed Care – PPO | Admitting: Oncology

## 2021-12-17 ENCOUNTER — Encounter: Payer: Self-pay | Admitting: Oncology

## 2021-12-17 VITALS — BP 106/72 | HR 92 | Temp 98.7°F | Resp 16 | Ht 66.0 in | Wt 126.0 lb

## 2021-12-17 DIAGNOSIS — Z7189 Other specified counseling: Secondary | ICD-10-CM

## 2021-12-17 DIAGNOSIS — D509 Iron deficiency anemia, unspecified: Secondary | ICD-10-CM

## 2021-12-17 DIAGNOSIS — Z5112 Encounter for antineoplastic immunotherapy: Secondary | ICD-10-CM | POA: Diagnosis not present

## 2021-12-17 DIAGNOSIS — Z5111 Encounter for antineoplastic chemotherapy: Secondary | ICD-10-CM | POA: Diagnosis not present

## 2021-12-17 DIAGNOSIS — C189 Malignant neoplasm of colon, unspecified: Secondary | ICD-10-CM | POA: Diagnosis not present

## 2021-12-17 DIAGNOSIS — D6481 Anemia due to antineoplastic chemotherapy: Secondary | ICD-10-CM

## 2021-12-17 DIAGNOSIS — E538 Deficiency of other specified B group vitamins: Secondary | ICD-10-CM

## 2021-12-17 DIAGNOSIS — T451X5A Adverse effect of antineoplastic and immunosuppressive drugs, initial encounter: Secondary | ICD-10-CM

## 2021-12-17 LAB — CBC WITH DIFFERENTIAL/PLATELET
Abs Immature Granulocytes: 0.02 10*3/uL (ref 0.00–0.07)
Basophils Absolute: 0 10*3/uL (ref 0.0–0.1)
Basophils Relative: 1 %
Eosinophils Absolute: 0.3 10*3/uL (ref 0.0–0.5)
Eosinophils Relative: 5 %
HCT: 30.5 % — ABNORMAL LOW (ref 36.0–46.0)
Hemoglobin: 9.6 g/dL — ABNORMAL LOW (ref 12.0–15.0)
Immature Granulocytes: 0 %
Lymphocytes Relative: 16 %
Lymphs Abs: 1 10*3/uL (ref 0.7–4.0)
MCH: 29.2 pg (ref 26.0–34.0)
MCHC: 31.5 g/dL (ref 30.0–36.0)
MCV: 92.7 fL (ref 80.0–100.0)
Monocytes Absolute: 0.8 10*3/uL (ref 0.1–1.0)
Monocytes Relative: 13 %
Neutro Abs: 3.9 10*3/uL (ref 1.7–7.7)
Neutrophils Relative %: 65 %
Platelets: 150 10*3/uL (ref 150–400)
RBC: 3.29 MIL/uL — ABNORMAL LOW (ref 3.87–5.11)
RDW: 14.6 % (ref 11.5–15.5)
WBC: 6 10*3/uL (ref 4.0–10.5)
nRBC: 0 % (ref 0.0–0.2)

## 2021-12-17 LAB — COMPREHENSIVE METABOLIC PANEL
ALT: 11 U/L (ref 0–44)
AST: 24 U/L (ref 15–41)
Albumin: 3 g/dL — ABNORMAL LOW (ref 3.5–5.0)
Alkaline Phosphatase: 78 U/L (ref 38–126)
Anion gap: 5 (ref 5–15)
BUN: 9 mg/dL (ref 8–23)
CO2: 25 mmol/L (ref 22–32)
Calcium: 8.8 mg/dL — ABNORMAL LOW (ref 8.9–10.3)
Chloride: 105 mmol/L (ref 98–111)
Creatinine, Ser: 0.54 mg/dL (ref 0.44–1.00)
GFR, Estimated: >60 mL/min (ref >60)
Glucose, Bld: 115 mg/dL — ABNORMAL HIGH (ref 70–99)
Potassium: 3.8 mmol/L (ref 3.5–5.1)
Sodium: 135 mmol/L (ref 135–145)
Total Bilirubin: 0.7 mg/dL (ref 0.3–1.2)
Total Protein: 6.7 g/dL (ref 6.5–8.1)

## 2021-12-17 LAB — IRON AND TIBC
Iron: 51 ug/dL (ref 28–170)
Saturation Ratios: 19 % (ref 10.4–31.8)
TIBC: 273 ug/dL (ref 250–450)
UIBC: 222 ug/dL

## 2021-12-17 LAB — FOLATE: Folate: 39 ng/mL (ref >5.9)

## 2021-12-17 LAB — VITAMIN B12: Vitamin B-12: 282 pg/mL (ref 180–914)

## 2021-12-17 LAB — FERRITIN: Ferritin: 532 ng/mL — ABNORMAL HIGH (ref 11–307)

## 2021-12-17 LAB — PROTEIN, URINE, RANDOM: Total Protein, Urine: <6 mg/dL

## 2021-12-17 MED ORDER — LEUCOVORIN CALCIUM INJECTION 350 MG
700.0000 mg | Freq: Once | INTRAVENOUS | Status: AC
  Administered 2021-12-17: 700 mg via INTRAVENOUS
  Filled 2021-12-17: qty 35

## 2021-12-17 MED ORDER — SODIUM CHLORIDE 0.9 % IV SOLN
5.0000 mg/kg | Freq: Once | INTRAVENOUS | Status: AC
  Administered 2021-12-17: 300 mg via INTRAVENOUS
  Filled 2021-12-17: qty 12

## 2021-12-17 MED ORDER — FLUOROURACIL CHEMO INJECTION 2.5 GM/50ML
400.0000 mg/m2 | Freq: Once | INTRAVENOUS | Status: AC
  Administered 2021-12-17: 700 mg via INTRAVENOUS
  Filled 2021-12-17: qty 14

## 2021-12-17 MED ORDER — SODIUM CHLORIDE 0.9 % IV SOLN
10.0000 mg | Freq: Once | INTRAVENOUS | Status: AC
  Administered 2021-12-17: 10 mg via INTRAVENOUS
  Filled 2021-12-17: qty 10

## 2021-12-17 MED ORDER — SODIUM CHLORIDE 0.9 % IV SOLN
Freq: Once | INTRAVENOUS | Status: AC
  Filled 2021-12-17: qty 250

## 2021-12-17 MED ORDER — SODIUM CHLORIDE 0.9 % IV SOLN
2400.0000 mg/m2 | INTRAVENOUS | Status: DC
  Administered 2021-12-17: 4100 mg via INTRAVENOUS
  Filled 2021-12-17: qty 82

## 2021-12-17 NOTE — Progress Notes (Signed)
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  She received IV Venofer for that.  I was concerned about malignancy given the degree of anemia and therefore obtain CT abdomen and pelvis with contrast CT showed 6.4 cm enhancing mass in the right liver and another mass measuring 4 cm.  Large volume stool in the right colon transverse and descending colon with irregular mass lesion in the distal sigmoid colon extending into the rectosigmoid junction with lateral extracolonic extension into the pelvic sidewall.  Apparent luminal narrowing at the level of the lesion which accounts for large volume of stool proximally.  Multiple pericolonic and perirectal lymph nodes.  Multiple peritoneal nodules are seen in the lower right pelvic sidewall.  Colon mass appears to be invading posterior uterus.Thrombosis of the superior rectal vein may be bland thrombus although direct tumor invasion not excluded.   Patient had a flexible sigmoidoscopy which showed a large obstructing mass in the rectosigmoid colon.  Scope could not be traversed beyond the obstruction.  Biopsies were taken and were consistent with adenocarcinoma. Patient had a diverting colostomy on 05/01/2021 with Dr. Dahlia Byes   Patient started palliative FOLFOX chemotherapy in June 2022 with plans to add Avastin after 6 cycles of treatment.   Patient developed allergic reaction to oxaliplatin with cycle 10 as evidenced by diaphoresis vomiting and flushing.  Plan is to hold off on further doses of oxaliplatin.  She has K-ras mutation positive  Interval history-overall tolerating chemotherapy well.  Other than mild fatigue she denies other complaints at this time.  Denies any port concerns  ECOG PS- 1 Pain scale- 0   Review of systems- Review of Systems  Constitutional:  Positive for malaise/fatigue. Negative for chills, fever and weight loss.  HENT:  Negative for congestion, ear discharge and nosebleeds.   Eyes:  Negative for blurred vision.  Respiratory:  Negative for cough, hemoptysis, sputum production, shortness of breath and wheezing.   Cardiovascular:  Negative for chest pain, palpitations, orthopnea and claudication.  Gastrointestinal:  Negative for abdominal pain, blood in stool, constipation, diarrhea, heartburn, melena, nausea and vomiting.  Genitourinary:  Negative for dysuria, flank pain, frequency, hematuria and urgency.  Musculoskeletal:  Negative for back pain, joint pain and myalgias.  Skin:  Negative for rash.  Neurological:  Negative for dizziness, tingling, focal weakness, seizures, weakness and headaches.  Endo/Heme/Allergies:  Does not bruise/bleed easily.  Psychiatric/Behavioral:  Negative for depression and suicidal ideas. The patient does not have insomnia.      Allergies  Allergen Reactions   Oxaliplatin Nausea And Vomiting     Past Medical History:  Diagnosis Date   Allergy    Anemia    Cancer [facility])      Past Surgical History:  Procedure Laterality Date   FLEXIBLE SIGMOIDOSCOPY N/A 04/24/2021   Procedure: FLEXIBLE SIGMOIDOSCOPY;  Surgeon: [name], MD;  Location: [facility] ENDOSCOPY;  Service: Gastroenterology;  Laterality: N/A;   IR CV  LINE INJECTION  11/13/2021   IR IMAGING GUIDED PORT INSERTION  11/19/2021   IR REMOVAL TUN ACCESS W/ PORT W/O FL MOD SED  11/19/2021   PORTACATH PLACEMENT  N/A 05/01/2021   Procedure: INSERTION PORT-A-CATH;  Surgeon: Jules Husbands, MD;  Location: [facility] ORS;  Service: General;  Laterality: N/A;    Social History   Socioeconomic History   Marital status: Married    Spouse name: Not on file   Number of children: Not on file   Years of education: Not on file   Highest education level: Not on file  Occupational History   Not on file  Tobacco Use   Smoking status: Never   Smokeless tobacco: Never  Vaping Use   Vaping Use: Never used  Substance and Sexual Activity   Alcohol use: Not Currently   Drug use: Never   Sexual activity: Not Currently  Other Topics Concern   Not on file  Social History Narrative   Not on file   Social Determinants of Health   Financial Resource Strain: Not on file  Food Insecurity: Not on file  Transportation Needs: Not on file  Physical Activity: Not on file  Stress: Not on file  Social Connections: Not on file  Intimate Partner Violence: Not on file    Family History  Problem Relation Age of Onset   Stroke Mother    Hypertension Mother    Cancer Father      Current Outpatient Medications:    acetaminophen (TYLENOL) 325 MG tablet, Take 650 mg by mouth every 6 (six) hours as needed., Disp: , Rfl:    lidocaine-prilocaine (EMLA) cream, Apply 1 application topically as needed., Disp: 30 g, Rfl: 2   potassium chloride SA (KLOR-CON M) 20 MEQ tablet, Take 1 tablet (20 mEq total) by mouth daily., Disp: 21 tablet, Rfl: 0   folic acid (FOLVITE) 1 MG tablet, Take 1 tablet (1 mg total) by mouth daily. (Patient not taking: Reported on 11/12/2021), Disp: 30 tablet, Rfl: 3   oxycodone (OXY-IR) 5 MG capsule, Take 5 mg by mouth every 4 (four) hours as needed. (Patient not taking: Reported on 11/12/2021), Disp: , Rfl:  No current facility-administered medications for this visit.  Facility-Administered Medications Ordered in Other Visits:    fluorouracil (ADRUCIL) 4,100 mg in sodium chloride 0.9 % 68 mL chemo  infusion, 2,400 mg/m2 (Treatment Plan Recorded), Intravenous, 1 day or 1 dose, Sindy Guadeloupe, MD, 4,100 mg at 12/17/21 1159  Physical exam:  Vitals:   12/17/21 0930  BP: 106/72  Pulse: 92  Resp: 16  Temp: 98.7 F (37.1 C)  TempSrc: Tympanic  SpO2: 99%  Weight: 126 lb (57.2 kg)  Height: '5\' 6"'  (1.676 m)   Physical Exam Constitutional:      General: She is not in acute distress. Cardiovascular:     Rate and Rhythm: Regular rhythm. Tachycardia present.     Heart sounds: Normal heart sounds.  Pulmonary:     Effort: Pulmonary effort is normal.     Breath sounds: Normal breath sounds.  Abdominal:     General: Bowel sounds are normal.     Palpations: Abdomen is soft.     Comments: Diverting colostomy in place  Skin:    General: Skin is warm and dry.  Neurological:     Mental Status: She is alert and oriented to person, place, and time.     CMP Latest Ref Rng & Units 12/17/2021  Glucose 70 - 99 mg/dL 115(H)  BUN 8 - 23 mg/dL 9  Creatinine 0.44 - 1.00 mg/dL 0.54  Sodium 135 - 145 mmol/L 135  Potassium 3.5 - 5.1 mmol/L 3.8  Chloride 98 - 111 mmol/L 105  CO2 22 - 32 mmol/L 25  Calcium 8.9 - 10.3 mg/dL 8.8(L)  Total Protein 6.5 - 8.1 g/dL 6.7  Total Bilirubin 0.3 - 1.2 mg/dL 0.7  Alkaline Phos 38 - 126 U/L 78  AST 15 - 41 U/L 24  ALT 0 - 44 U/L 11   CBC Latest Ref Rng & Units 12/17/2021  WBC 4.0 - 10.5 K/uL 6.0  Hemoglobin 12.0 - 15.0 g/dL 9.6(L)  Hematocrit 36.0 - 46.0 % 30.5(L)  Platelets 150 - 400 K/uL 150    No images are attached to the encounter.  CT CHEST ABDOMEN PELVIS W CONTRAST  Result Date: 12/15/2021 CLINICAL DATA:  Restaging metastatic colon cancer. EXAM: CT CHEST, ABDOMEN, AND PELVIS WITH CONTRAST TECHNIQUE: Multidetector CT imaging of the chest, abdomen and pelvis was performed following the standard protocol during bolus administration of intravenous contrast. RADIATION DOSE REDUCTION: This exam was performed according to the departmental  dose-optimization program which includes automated exposure control, adjustment of the mA and/or kV according to patient size and/or use of iterative reconstruction technique. CONTRAST:  134m OMNIPAQUE IOHEXOL 300 MG/ML  SOLN COMPARISON:  08/21/2021 FINDINGS: CT CHEST FINDINGS Cardiovascular: The heart size appears within normal limits. There is no pericardial effusion identified. Mediastinum/Nodes: No enlarged mediastinal, hilar, or axillary lymph nodes. Thyroid gland, trachea, and esophagus demonstrate no significant findings. Lungs/Pleura: New subsegmental atelectasis within the right lower lobe. No pleural effusion or edema identified. No airspace consolidation. No suspicious lung nodules Musculoskeletal: No chest wall mass or suspicious bone lesions identified. CT ABDOMEN PELVIS FINDINGS Hepatobiliary: Signs of liver metastases are again noted. Within segment 6 there is a mass measuring 5.2 x 5.3 by 6.3 cm, image 64/2. Previously this was measured at 5.1 x 5.1 by 6.1 cm. No new liver metastases identified. Gallbladder is normal. No bile duct dilatation. Pancreas: Unremarkable. No pancreatic ductal dilatation or surrounding inflammatory changes. Spleen: Normal in size without focal abnormality. Adrenals/Urinary Tract: Normal adrenal glands. No nephrolithiasis, hydronephrosis or mass. Small cyst within inferior pole of the left kidney measures 9 mm. Urinary bladder is unremarkable. Stomach/Bowel: Again seen are changes of left lower quadrant diverting loop descending colostomy. Large stool burden is again noted proximal to the ostomy site. Ulcerative infiltrative mass involving the sigmoid colon is again noted. This measures 4.7 by 5.0 by 6.3 cm, image 103/2. Formally this measured 4.4 x 4.5 by 5.6 cm. Vascular/Lymphatic: Normal appearance of the abdominal aorta. Abdominopelvic adenopathy is again noted. Left pelvic sidewall lymph node measures 1.6 x 1.4 cm, image 105/2. Previously 1.2 x 1.0 cm. Lymph node  within the sigmoid mesocolon measures 1.5 x 1.3 cm, image 99/2. This is compared with 1.2 x 1.0 cm previously. Perirectal lymph node measures 1.1 x 1.0 cm, image 110/2. Previously 0.8 x 0.7 cm. Reproductive: The ulcerative mass involving the sigmoid colon extends up to the left side of uterine fundus. Cannot exclude tumor extension into the myometrium, image 108/2. Other: Trace perihepatic ascites. Musculoskeletal: No acute or significant osseous findings. IMPRESSION: 1. Mild progression of disease. There is been increase in size of ulcerative, infiltrative mass involving the sigmoid colon. 2. Mild increase in size of index lesion within segment 6 of liver. 3. Pelvic adenopathy is mildly increased in size in the interval. 4. No evidence for metastatic disease to the chest.  Electronically Signed   By: Kerby Moors M.D.   On: 12/15/2021 14:38   IR REMOVAL TUN ACCESS W/ PORT W/O FL MOD SED  Result Date: 11/19/2021 INDICATION: 66 year old woman with history of colorectal malignancy presents to IR for removal of malposition non aspirating right chest port and placement of new left chest port. EXAM: REMOVAL RIGHT IJ VEIN PORT-A-CATH MEDICATIONS: None. ANESTHESIA/SEDATION: None FLUOROSCOPY TIME:  None COMPLICATIONS: None immediate. PROCEDURE: Informed written consent was obtained from the patient after a thorough discussion of the procedural risks, benefits and alternatives. All questions were addressed. Maximal Sterile Barrier Technique was utilized including caps, mask, sterile gowns, sterile gloves, sterile drape, hand hygiene and skin antiseptic. A timeout was performed prior to the initiation of the procedure. A timeout was performed prior to the initiation of the procedure. Patient positioned supine on the angiography table. Right neck and anterior upper chest prepped and draped in the usual sterile fashion. All elements of maximal sterile barrier were utilized including, cap, mask, sterile gown, sterile  gloves, large sterile drape, hand scrubbing and 2% Chlorhexidine for skin cleaning. The right internal jugular vein was evaluated with ultrasound and shown to be patent. A permanent ultrasound image was obtained and placed in the patient's medical record. Local anesthesia was provided with 1% lidocaine with epinephrine. Using sterile gel and a sterile probe cover, the right internal jugular vein was entered with a 21 ga needle during real time ultrasound guidance. 0.018 inch guidewire placed and 21 ga needle exchanged for transitional dilator set. Utilizing fluoroscopy, 0.035 inch guidewire advanced through the needle without difficulty. Attention then turned to the right anterior upper chest. Following local lidocaine administration, a port pocket was created. The catheter was connected to the port and brought from the pocket to the venotomy site through a subcutaneous tunnel. The catheter was cut to size and inserted through the peel-away sheath. The catheter tip was positioned at the cavoatrial junction using fluoroscopic guidance. The port aspirated and flushed well. The port pocket was closed with deep and superficial absorbable suture. The port pocket incision and venotomy sites were also sealed with Dermabond. IMPRESSION: Successful right IJ vein Port-A-Cath explant. Electronically Signed   By: Miachel Roux M.D.   On: 11/19/2021 13:29   IR IMAGING GUIDED PORT INSERTION  Result Date: 11/19/2021 INDICATION: 66 year old woman with history of colorectal malignancy presents to IR for removal of malposition non aspirating right chest port and placement of new left chest port. EXAM: IMPLANTED PORT A CATH PLACEMENT WITH ULTRASOUND AND FLUOROSCOPIC GUIDANCE MEDICATIONS: None ANESTHESIA/SEDATION: Moderate (conscious) sedation was employed during this procedure. A total of Versed 2 mg and Fentanyl 100 mcg was administered intravenously. Moderate Sedation Time: 32 minutes. The patient's level of consciousness and  vital signs were monitored continuously by radiology nursing throughout the procedure under my direct supervision. FLUOROSCOPY TIME:  1 minutes, 6 seconds (3.2 mGy) COMPLICATIONS: None immediate. PROCEDURE: The procedure, risks, benefits, and alternatives were explained to the patient. Questions regarding the procedure were encouraged and answered. The patient understands and consents to the procedure. A timeout was performed prior to the initiation of the procedure. Patient positioned supine on the angiography table. Left neck and anterior upper chest prepped and draped in the usual sterile fashion. All elements of maximal sterile barrier were utilized including, cap, mask, sterile gown, sterile gloves, large sterile drape, hand scrubbing and 2% Chlorhexidine for skin cleaning. The left internal jugular vein was evaluated with ultrasound and shown to be patent. A permanent ultrasound  image was obtained and placed in the patient's medical record. Local anesthesia was provided with 1% lidocaine with epinephrine. Using sterile gel and a sterile probe cover, the left internal jugular vein was entered with a 21 ga needle during real time ultrasound guidance. 0.018 inch guidewire placed and 21 ga needle exchanged for transitional dilator set. Utilizing fluoroscopy, 0.035 inch guidewire advanced through the needle without difficulty. Attention then turned to the left anterior upper chest. Following local lidocaine administration, a port pocket was created. The catheter was connected to the port and brought from the pocket to the venotomy site through a subcutaneous tunnel. The catheter was cut to size and inserted through the peel-away sheath. The catheter tip was positioned at the cavoatrial junction using fluoroscopic guidance. The port aspirated and flushed well. The port pocket was closed with deep and superficial absorbable suture. The port pocket incision and venotomy sites were also sealed with Dermabond.  IMPRESSION: Successful placement of a left internal jugular approach power injectable Port-A-Cath. The catheter is ready for immediate use. Electronically Signed   By: Miachel Roux M.D.   On: 11/19/2021 13:28     Assessment and plan- Patient is a 66 y.o. female   with metastatic adenocarcinoma of the sigmoid colon/rectum with peritoneal and liver metastases.  She is here for on treatment assessment prior to cycle 13 of 5-FU bevacizumab chemotherapy and discuss CT scan results  I have reviewed CT chest abdomen pelvis images independently and discussed findings with the patient.  There has been mild progression in her intra-abdominal adenopathy as well as primary colonic mass.  Solitary liver met remained stable.  I will therefore proceed with 5-FU bevacizumab chemotherapy for this cycle but starting next cycle on board she will be getting FOLFIRI bevacizumab.  She is K-ras mutant and therefore not a candidate for cetuximab or panitumumab.  We will also work on getting NGS testing on her tumor specimen.  Blood pressure and urine protein is otherwise acceptable to proceed with this today.  Normocytic anemia: Chemo induced.  Overall stable continue to monitor   Visit Diagnosis 1. Encounter for antineoplastic chemotherapy   2. Goals of care, counseling/discussion   3. Metastatic colon cancer in female [facility])   4. Antineoplastic chemotherapy induced anemia      Dr. Randa Evens, MD, MPH [facility] at [facility] [phone] 12/17/2021 12:17 PM

## 2021-12-17 NOTE — Patient Instructions (Signed)
[facility] CANCER CTR AT [place]  Discharge Instructions: Thank you for choosing [facility] to provide your oncology and hematology care.  If you have a lab appointment with the [facility], please go directly to the [facility] and check in at the registration area.  Wear comfortable clothing and clothing appropriate for easy access to any Portacath or PICC line.   We strive to give you quality time with your provider. You may need to reschedule your appointment if you arrive late (15 or more minutes).  Arriving late affects you and other patients whose appointments are after yours.  Also, if you miss three or more appointments without notifying the office, you may be dismissed from the clinic at the providers discretion.      For prescription refill requests, have your pharmacy contact our office and allow 72 hours for refills to be completed.    Today you received the following chemotherapy and/or immunotherapy agents: 40fu pump, Avastin      To help prevent nausea and vomiting after your treatment, we encourage you to take your nausea medication as directed.  BELOW ARE SYMPTOMS THAT SHOULD BE REPORTED IMMEDIATELY: *FEVER GREATER THAN 100.4 F (38 C) OR HIGHER *CHILLS OR SWEATING *NAUSEA AND VOMITING THAT IS NOT CONTROLLED WITH YOUR NAUSEA MEDICATION *UNUSUAL SHORTNESS OF BREATH *UNUSUAL BRUISING OR BLEEDING *URINARY PROBLEMS (pain or burning when urinating, or frequent urination) *BOWEL PROBLEMS (unusual diarrhea, constipation, pain near the anus) TENDERNESS IN MOUTH AND THROAT WITH OR WITHOUT PRESENCE OF ULCERS (sore throat, sores in mouth, or a toothache) UNUSUAL RASH, SWELLING OR PAIN  UNUSUAL VAGINAL DISCHARGE OR ITCHING   Items with * indicate a potential emergency and should be followed up as soon as possible or go to the Emergency Department if any problems should occur.  Please show the CHEMOTHERAPY ALERT CARD or IMMUNOTHERAPY ALERT CARD at  check-in to the Emergency Department and triage nurse.  Should you have questions after your visit or need to cancel or reschedule your appointment, please contact [facility] CANCER [place] AT [place]  [phone] and follow the prompts.  Office hours are 8:00 a.m. to 4:30 p.m. Monday - Friday. Please note that voicemails left after 4:00 p.m. may not be returned until the following business day.  We are closed weekends and major holidays. You have access to a nurse at all times for urgent questions. Please call the main number to the clinic [phone] and follow the prompts.  For any non-urgent questions, you may also contact your provider using MyChart. We now offer e-Visits for anyone 58 and older to request care online for non-urgent symptoms. For details visit [email or web address].   Also download the MyChart app! Go to the app store, search "MyChart", open the app, select [facility], and log in with your MyChart username and password.  Due to Covid, a mask is required upon entering the hospital/clinic. If you do not have a mask, one will be given to you upon arrival. For doctor visits, patients may have 1 support person aged 78 or older with them. For treatment visits, patients cannot have anyone with them due to current Covid guidelines and our immunocompromised population. Fluorouracil, 5-FU injection What is this medication? FLUOROURACIL, 5-FU (flure oh YOOR a sil) is a chemotherapy drug. It slows the growth of cancer cells. This medicine is used to treat many types of cancer like breast cancer, colon or rectal cancer, pancreatic cancer, and stomach cancer. This medicine may be used for other purposes;  ask your health care provider or pharmacist if you have questions. COMMON BRAND NAME(S): Adrucil What should I tell my care team before I take this medication? They need to know if you have any of these conditions: blood disorders dihydropyrimidine dehydrogenase (DPD)  deficiency infection (especially a virus infection such as chickenpox, cold sores, or herpes) kidney disease liver disease malnourished, poor nutrition recent or ongoing radiation therapy an unusual or allergic reaction to fluorouracil, other chemotherapy, other medicines, foods, dyes, or preservatives pregnant or trying to get pregnant breast-feeding How should I use this medication? This drug is given as an infusion or injection into a vein. It is administered in a hospital or clinic by a specially trained health care professional. Talk to your pediatrician regarding the use of this medicine in children. Special care may be needed. Overdosage: If you think you have taken too much of this medicine contact a poison control center or emergency room at once. NOTE: This medicine is only for you. Do not share this medicine with others. What if I miss a dose? It is important not to miss your dose. Call your doctor or health care professional if you are unable to keep an appointment. What may interact with this medication? Do not take this medicine with any of the following medications: live virus vaccines This medicine may also interact with the following medications: medicines that treat or prevent blood clots like warfarin, enoxaparin, and dalteparin This list may not describe all possible interactions. Give your health care provider a list of all the medicines, herbs, non-prescription drugs, or dietary supplements you use. Also tell them if you smoke, drink alcohol, or use illegal drugs. Some items may interact with your medicine. What should I watch for while using this medication? Visit your doctor for checks on your progress. This drug may make you feel generally unwell. This is not uncommon, as chemotherapy can affect healthy cells as well as cancer cells. Report any side effects. Continue your course of treatment even though you feel ill unless your doctor tells you to stop. In some cases,  you may be given additional medicines to help with side effects. Follow all directions for their use. Call your doctor or health care professional for advice if you get a fever, chills or sore throat, or other symptoms of a cold or flu. Do not treat yourself. This drug decreases your body's ability to fight infections. Try to avoid being around people who are sick. This medicine may increase your risk to bruise or bleed. Call your doctor or health care professional if you notice any unusual bleeding. Be careful brushing and flossing your teeth or using a toothpick because you may get an infection or bleed more easily. If you have any dental work done, tell your dentist you are receiving this medicine. Avoid taking products that contain aspirin, acetaminophen, ibuprofen, naproxen, or ketoprofen unless instructed by your doctor. These medicines may hide a fever. Do not become pregnant while taking this medicine. Women should inform their doctor if they wish to become pregnant or think they might be pregnant. There is a potential for serious side effects to an unborn child. Talk to your health care professional or pharmacist for more information. Do not breast-feed an infant while taking this medicine. Men should inform their doctor if they wish to father a child. This medicine may lower sperm counts. Do not treat diarrhea with over the counter products. Contact your doctor if you have diarrhea that lasts more than 2  days or if it is severe and watery. This medicine can make you more sensitive to the sun. Keep out of the sun. If you cannot avoid being in the sun, wear protective clothing and use sunscreen. Do not use sun lamps or tanning beds/booths. What side effects may I notice from receiving this medication? Side effects that you should report to your doctor or health care professional as soon as possible: allergic reactions like skin rash, itching or hives, swelling of the face, lips, or tongue low  blood counts - this medicine may decrease the number of white blood cells, red blood cells and platelets. You may be at increased risk for infections and bleeding. signs of infection - fever or chills, cough, sore throat, pain or difficulty passing urine signs of decreased platelets or bleeding - bruising, pinpoint red spots on the skin, black, tarry stools, blood in the urine signs of decreased red blood cells - unusually weak or tired, fainting spells, lightheadedness breathing problems changes in vision chest pain mouth sores nausea and vomiting pain, swelling, redness at site where injected pain, tingling, numbness in the hands or feet redness, swelling, or sores on hands or feet stomach pain unusual bleeding Side effects that usually do not require medical attention (report to your doctor or health care professional if they continue or are bothersome): changes in finger or toe nails diarrhea dry or itchy skin hair loss headache loss of appetite sensitivity of eyes to the light stomach upset unusually teary eyes This list may not describe all possible side effects. Call your doctor for medical advice about side effects. You may report side effects to FDA at 1-800-FDA-1088. Where should I keep my medication? This drug is given in a hospital or clinic and will not be stored at home. NOTE: This sheet is a summary. It may not cover all possible information. If you have questions about this medicine, talk to your doctor, pharmacist, or health care provider.  2022 Elsevier/Gold Standard (2021-08-05 00:00:00) Leucovorin injection What is this medication? LEUCOVORIN (loo koe VOR in) is used to prevent or treat the harmful effects of some medicines. This medicine is used to treat anemia caused by a low amount of folic acid in the body. It is also used with 5-fluorouracil (5-FU) to treat colon cancer. This medicine may be used for other purposes; ask your health care provider or pharmacist  if you have questions. What should I tell my care team before I take this medication? They need to know if you have any of these conditions: anemia from low levels of vitamin B-12 in the blood an unusual or allergic reaction to leucovorin, folic acid, other medicines, foods, dyes, or preservatives pregnant or trying to get pregnant breast-feeding How should I use this medication? This medicine is for injection into a muscle or into a vein. It is given by a health care professional in a hospital or clinic setting. Talk to your pediatrician regarding the use of this medicine in children. Special care may be needed. Overdosage: If you think you have taken too much of this medicine contact a poison control center or emergency room at once. NOTE: This medicine is only for you. Do not share this medicine with others. What if I miss a dose? This does not apply. What may interact with this medication? capecitabine fluorouracil phenobarbital phenytoin primidone trimethoprim-sulfamethoxazole This list may not describe all possible interactions. Give your health care provider a list of all the medicines, herbs, non-prescription drugs, or dietary supplements  you use. Also tell them if you smoke, drink alcohol, or use illegal drugs. Some items may interact with your medicine. What should I watch for while using this medication? Your condition will be monitored carefully while you are receiving this medicine. This medicine may increase the side effects of 5-fluorouracil, 5-FU. Tell your doctor or health care professional if you have diarrhea or mouth sores that do not get better or that get worse. What side effects may I notice from receiving this medication? Side effects that you should report to your doctor or health care professional as soon as possible: allergic reactions like skin rash, itching or hives, swelling of the face, lips, or tongue breathing problems fever, infection mouth  sores unusual bleeding or bruising unusually weak or tired Side effects that usually do not require medical attention (report to your doctor or health care professional if they continue or are bothersome): constipation or diarrhea loss of appetite nausea, vomiting This list may not describe all possible side effects. Call your doctor for medical advice about side effects. You may report side effects to FDA at 1-800-FDA-1088. Where should I keep my medication? This drug is given in a hospital or clinic and will not be stored at home. NOTE: This sheet is a summary. It may not cover all possible information. If you have questions about this medicine, talk to your doctor, pharmacist, or health care provider.  2022 Elsevier/Gold Standard (2008-05-24 00:00:00) Bevacizumab injection What is this medication? BEVACIZUMAB (be va SIZ yoo mab) is a monoclonal antibody. It is used to treat many types of cancer. This medicine may be used for other purposes; ask your health care provider or pharmacist if you have questions. COMMON BRAND NAME(S): Alymsys, Avastin, MVASI, Noah Charon What should I tell my care team before I take this medication? They need to know if you have any of these conditions: diabetes heart disease high blood pressure history of coughing up blood prior anthracycline chemotherapy (e.g., doxorubicin, daunorubicin, epirubicin) recent or ongoing radiation therapy recent or planning to have surgery stroke an unusual or allergic reaction to bevacizumab, hamster proteins, mouse proteins, other medicines, foods, dyes, or preservatives pregnant or trying to get pregnant breast-feeding How should I use this medication? This medicine is for infusion into a vein. It is given by a health care professional in a hospital or clinic setting. Talk to your pediatrician regarding the use of this medicine in children. Special care may be needed. Overdosage: If you think you have taken too much of  this medicine contact a poison control center or emergency room at once. NOTE: This medicine is only for you. Do not share this medicine with others. What if I miss a dose? It is important not to miss your dose. Call your doctor or health care professional if you are unable to keep an appointment. What may interact with this medication? Interactions are not expected. This list may not describe all possible interactions. Give your health care provider a list of all the medicines, herbs, non-prescription drugs, or dietary supplements you use. Also tell them if you smoke, drink alcohol, or use illegal drugs. Some items may interact with your medicine. What should I watch for while using this medication? Your condition will be monitored carefully while you are receiving this medicine. You will need important blood work and urine testing done while you are taking this medicine. This medicine may increase your risk to bruise or bleed. Call your doctor or health care professional if you notice any  unusual bleeding. Before having surgery, talk to your health care provider to make sure it is ok. This drug can increase the risk of poor healing of your surgical site or wound. You will need to stop this drug for 28 days before surgery. After surgery, wait at least 28 days before restarting this drug. Make sure the surgical site or wound is healed enough before restarting this drug. Talk to your health care provider if questions. Do not become pregnant while taking this medicine or for 6 months after stopping it. Women should inform their doctor if they wish to become pregnant or think they might be pregnant. There is a potential for serious side effects to an unborn child. Talk to your health care professional or pharmacist for more information. Do not breast-feed an infant while taking this medicine and for 6 months after the last dose. This medicine has caused ovarian failure in some women. This medicine may  interfere with the ability to have a child. You should talk to your doctor or health care professional if you are concerned about your fertility. What side effects may I notice from receiving this medication? Side effects that you should report to your doctor or health care professional as soon as possible: allergic reactions like skin rash, itching or hives, swelling of the face, lips, or tongue chest pain or chest tightness chills coughing up blood high fever seizures severe constipation signs and symptoms of bleeding such as bloody or black, tarry stools; red or dark-brown urine; spitting up blood or brown material that looks like coffee grounds; red spots on the skin; unusual bruising or bleeding from the eye, gums, or nose signs and symptoms of a blood clot such as breathing problems; chest pain; severe, sudden headache; pain, swelling, warmth in the leg signs and symptoms of a stroke like changes in vision; confusion; trouble speaking or understanding; severe headaches; sudden numbness or weakness of the face, arm or leg; trouble walking; dizziness; loss of balance or coordination stomach pain sweating swelling of legs or ankles vomiting weight gain Side effects that usually do not require medical attention (report to your doctor or health care professional if they continue or are bothersome): back pain changes in taste decreased appetite dry skin nausea tiredness This list may not describe all possible side effects. Call your doctor for medical advice about side effects. You may report side effects to FDA at 1-800-FDA-1088. Where should I keep my medication? This drug is given in a hospital or clinic and will not be stored at home. NOTE: This sheet is a summary. It may not cover all possible information. If you have questions about this medicine, talk to your doctor, pharmacist, or health care provider.  2022 Elsevier/Gold Standard (2021-08-05 00:00:00)

## 2021-12-19 ENCOUNTER — Inpatient Hospital Stay: Payer: BC Managed Care – PPO

## 2021-12-19 ENCOUNTER — Other Ambulatory Visit: Payer: Self-pay

## 2021-12-19 DIAGNOSIS — Z5112 Encounter for antineoplastic immunotherapy: Secondary | ICD-10-CM | POA: Diagnosis not present

## 2021-12-19 DIAGNOSIS — C189 Malignant neoplasm of colon, unspecified: Secondary | ICD-10-CM

## 2021-12-19 MED ORDER — HEPARIN SOD (PORK) LOCK FLUSH 100 UNIT/ML IV SOLN
500.0000 [IU] | Freq: Once | INTRAVENOUS | Status: AC | PRN
  Administered 2021-12-19: 500 [IU]
  Filled 2021-12-19: qty 5

## 2021-12-19 MED ORDER — SODIUM CHLORIDE 0.9% FLUSH
10.0000 mL | INTRAVENOUS | Status: DC | PRN
  Administered 2021-12-19: 10 mL
  Filled 2021-12-19: qty 10

## 2021-12-19 NOTE — Patient Instructions (Signed)
[facility] CANCER CTR AT [place]  Discharge Instructions: Thank you for choosing [facility] to provide your oncology and hematology care.  If you have a lab appointment with the [facility], please go directly to the [facility] and check in at the registration area.  Wear comfortable clothing and clothing appropriate for easy access to any Portacath or PICC line.   We strive to give you quality time with your provider. You may need to reschedule your appointment if you arrive late (15 or more minutes).  Arriving late affects you and other patients whose appointments are after yours.  Also, if you miss three or more appointments without notifying the office, you may be dismissed from the clinic at the providers discretion.      For prescription refill requests, have your pharmacy contact our office and allow 72 hours for refills to be completed.    Today you received the following chemotherapy and/or immunotherapy agents PUMP STOP      To help prevent nausea and vomiting after your treatment, we encourage you to take your nausea medication as directed.  BELOW ARE SYMPTOMS THAT SHOULD BE REPORTED IMMEDIATELY: *FEVER GREATER THAN 100.4 F (38 C) OR HIGHER *CHILLS OR SWEATING *NAUSEA AND VOMITING THAT IS NOT CONTROLLED WITH YOUR NAUSEA MEDICATION *UNUSUAL SHORTNESS OF BREATH *UNUSUAL BRUISING OR BLEEDING *URINARY PROBLEMS (pain or burning when urinating, or frequent urination) *BOWEL PROBLEMS (unusual diarrhea, constipation, pain near the anus) TENDERNESS IN MOUTH AND THROAT WITH OR WITHOUT PRESENCE OF ULCERS (sore throat, sores in mouth, or a toothache) UNUSUAL RASH, SWELLING OR PAIN  UNUSUAL VAGINAL DISCHARGE OR ITCHING   Items with * indicate a potential emergency and should be followed up as soon as possible or go to the Emergency Department if any problems should occur.  Please show the CHEMOTHERAPY ALERT CARD or IMMUNOTHERAPY ALERT CARD at check-in to  the Emergency Department and triage nurse.  Should you have questions after your visit or need to cancel or reschedule your appointment, please contact [facility] CANCER [place] AT [place]  [phone] and follow the prompts.  Office hours are 8:00 a.m. to 4:30 p.m. Monday - Friday. Please note that voicemails left after 4:00 p.m. may not be returned until the following business day.  We are closed weekends and major holidays. You have access to a nurse at all times for urgent questions. Please call the main number to the clinic [phone] and follow the prompts.  For any non-urgent questions, you may also contact your provider using MyChart. We now offer e-Visits for anyone 72 and older to request care online for non-urgent symptoms. For details visit [email or web address].   Also download the MyChart app! Go to the app store, search "MyChart", open the app, select [facility], and log in with your MyChart username and password.  Due to Covid, a mask is required upon entering the hospital/clinic. If you do not have a mask, one will be given to you upon arrival. For doctor visits, patients may have 1 support person aged 66 or older with them. For treatment visits, patients cannot have anyone with them due to current Covid guidelines and our immunocompromised population.

## 2021-12-31 ENCOUNTER — Inpatient Hospital Stay (HOSPITAL_BASED_OUTPATIENT_CLINIC_OR_DEPARTMENT_OTHER): Payer: BC Managed Care – PPO | Admitting: Oncology

## 2021-12-31 ENCOUNTER — Other Ambulatory Visit: Payer: Self-pay

## 2021-12-31 ENCOUNTER — Inpatient Hospital Stay: Payer: BC Managed Care – PPO

## 2021-12-31 ENCOUNTER — Inpatient Hospital Stay: Payer: BC Managed Care – PPO | Attending: Oncology

## 2021-12-31 ENCOUNTER — Encounter: Payer: Self-pay | Admitting: Oncology

## 2021-12-31 VITALS — BP 116/80 | HR 98 | Temp 97.0°F | Resp 16 | Wt 125.7 lb

## 2021-12-31 DIAGNOSIS — C187 Malignant neoplasm of sigmoid colon: Secondary | ICD-10-CM | POA: Insufficient documentation

## 2021-12-31 DIAGNOSIS — C787 Secondary malignant neoplasm of liver and intrahepatic bile duct: Secondary | ICD-10-CM | POA: Insufficient documentation

## 2021-12-31 DIAGNOSIS — C786 Secondary malignant neoplasm of retroperitoneum and peritoneum: Secondary | ICD-10-CM | POA: Diagnosis not present

## 2021-12-31 DIAGNOSIS — D6959 Other secondary thrombocytopenia: Secondary | ICD-10-CM

## 2021-12-31 DIAGNOSIS — Z5111 Encounter for antineoplastic chemotherapy: Secondary | ICD-10-CM | POA: Diagnosis present

## 2021-12-31 DIAGNOSIS — Z5112 Encounter for antineoplastic immunotherapy: Secondary | ICD-10-CM | POA: Insufficient documentation

## 2021-12-31 DIAGNOSIS — C189 Malignant neoplasm of colon, unspecified: Secondary | ICD-10-CM

## 2021-12-31 DIAGNOSIS — T451X5A Adverse effect of antineoplastic and immunosuppressive drugs, initial encounter: Secondary | ICD-10-CM

## 2021-12-31 DIAGNOSIS — C20 Malignant neoplasm of rectum: Secondary | ICD-10-CM | POA: Insufficient documentation

## 2021-12-31 DIAGNOSIS — Z452 Encounter for adjustment and management of vascular access device: Secondary | ICD-10-CM | POA: Insufficient documentation

## 2021-12-31 DIAGNOSIS — Z7189 Other specified counseling: Secondary | ICD-10-CM

## 2021-12-31 DIAGNOSIS — D6481 Anemia due to antineoplastic chemotherapy: Secondary | ICD-10-CM | POA: Diagnosis not present

## 2021-12-31 DIAGNOSIS — Z933 Colostomy status: Secondary | ICD-10-CM

## 2021-12-31 LAB — COMPREHENSIVE METABOLIC PANEL
ALT: 11 U/L (ref 0–44)
AST: 18 U/L (ref 15–41)
Albumin: 3.2 g/dL — ABNORMAL LOW (ref 3.5–5.0)
Alkaline Phosphatase: 77 U/L (ref 38–126)
Anion gap: 8 (ref 5–15)
BUN: 9 mg/dL (ref 8–23)
CO2: 24 mmol/L (ref 22–32)
Calcium: 9 mg/dL (ref 8.9–10.3)
Chloride: 103 mmol/L (ref 98–111)
Creatinine, Ser: 0.51 mg/dL (ref 0.44–1.00)
GFR, Estimated: >60 mL/min (ref >60)
Glucose, Bld: 110 mg/dL — ABNORMAL HIGH (ref 70–99)
Potassium: 3.5 mmol/L (ref 3.5–5.1)
Sodium: 135 mmol/L (ref 135–145)
Total Bilirubin: 0.8 mg/dL (ref 0.3–1.2)
Total Protein: 7.3 g/dL (ref 6.5–8.1)

## 2021-12-31 LAB — CBC WITH DIFFERENTIAL/PLATELET
Abs Immature Granulocytes: 0.02 10*3/uL (ref 0.00–0.07)
Basophils Absolute: 0.1 10*3/uL (ref 0.0–0.1)
Basophils Relative: 1 %
Eosinophils Absolute: 0.2 10*3/uL (ref 0.0–0.5)
Eosinophils Relative: 4 %
HCT: 32.2 % — ABNORMAL LOW (ref 36.0–46.0)
Hemoglobin: 10.2 g/dL — ABNORMAL LOW (ref 12.0–15.0)
Immature Granulocytes: 0 %
Lymphocytes Relative: 16 %
Lymphs Abs: 0.8 10*3/uL (ref 0.7–4.0)
MCH: 29.1 pg (ref 26.0–34.0)
MCHC: 31.7 g/dL (ref 30.0–36.0)
MCV: 91.7 fL (ref 80.0–100.0)
Monocytes Absolute: 0.8 10*3/uL (ref 0.1–1.0)
Monocytes Relative: 14 %
Neutro Abs: 3.5 10*3/uL (ref 1.7–7.7)
Neutrophils Relative %: 65 %
Platelets: 140 10*3/uL — ABNORMAL LOW (ref 150–400)
RBC: 3.51 MIL/uL — ABNORMAL LOW (ref 3.87–5.11)
RDW: 14 % (ref 11.5–15.5)
WBC: 5.3 10*3/uL (ref 4.0–10.5)
nRBC: 0 % (ref 0.0–0.2)

## 2021-12-31 LAB — PROTEIN, URINE, RANDOM: Total Protein, Urine: 8 mg/dL

## 2021-12-31 MED ORDER — SODIUM CHLORIDE 0.9 % IV SOLN
5.0000 mg/kg | Freq: Once | INTRAVENOUS | Status: AC
  Administered 2021-12-31: 300 mg via INTRAVENOUS
  Filled 2021-12-31: qty 12

## 2021-12-31 MED ORDER — SODIUM CHLORIDE 0.9 % IV SOLN
Freq: Once | INTRAVENOUS | Status: AC
  Filled 2021-12-31: qty 250

## 2021-12-31 MED ORDER — SODIUM CHLORIDE 0.9 % IV SOLN
2400.0000 mg/m2 | INTRAVENOUS | Status: DC
  Administered 2021-12-31: 4100 mg via INTRAVENOUS
  Filled 2021-12-31: qty 82

## 2021-12-31 MED ORDER — ATROPINE SULFATE 1 MG/ML IV SOLN
0.5000 mg | Freq: Once | INTRAVENOUS | Status: AC
  Administered 2021-12-31: 0.5 mg via INTRAVENOUS
  Filled 2021-12-31: qty 1

## 2021-12-31 MED ORDER — SODIUM CHLORIDE 0.9 % IV SOLN
150.0000 mg/m2 | Freq: Once | INTRAVENOUS | Status: AC
  Administered 2021-12-31: 260 mg via INTRAVENOUS
  Filled 2021-12-31: qty 10

## 2021-12-31 MED ORDER — FLUOROURACIL CHEMO INJECTION 2.5 GM/50ML
400.0000 mg/m2 | Freq: Once | INTRAVENOUS | Status: AC
  Administered 2021-12-31: 700 mg via INTRAVENOUS
  Filled 2021-12-31: qty 14

## 2021-12-31 MED ORDER — SODIUM CHLORIDE 0.9 % IV SOLN
409.0000 mg/m2 | Freq: Once | INTRAVENOUS | Status: AC
  Administered 2021-12-31: 700 mg via INTRAVENOUS
  Filled 2021-12-31: qty 35

## 2021-12-31 MED ORDER — LEUCOVORIN CALCIUM INJECTION 350 MG
700.0000 mg | Freq: Once | INTRAVENOUS | Status: DC
  Filled 2021-12-31: qty 35

## 2021-12-31 MED ORDER — SODIUM CHLORIDE 0.9 % IV SOLN
10.0000 mg | Freq: Once | INTRAVENOUS | Status: AC
  Administered 2021-12-31: 10 mg via INTRAVENOUS
  Filled 2021-12-31: qty 10

## 2021-12-31 NOTE — Patient Instructions (Addendum)
[facility] CANCER CTR AT [place]  Discharge Instructions: Thank you for choosing [facility] to provide your oncology and hematology care.  If you have a lab appointment with the [facility], please go directly to the [facility] and check in at the registration area.  Wear comfortable clothing and clothing appropriate for easy access to any Portacath or PICC line.   We strive to give you quality time with your provider. You may need to reschedule your appointment if you arrive late (15 or more minutes).  Arriving late affects you and other patients whose appointments are after yours.  Also, if you miss three or more appointments without notifying the office, you may be dismissed from the clinic at the providers discretion.      For prescription refill requests, have your pharmacy contact our office and allow 72 hours for refills to be completed.    Today you received the following chemotherapy and/or immunotherapy agents : Folfiri / Avastin/ 5FU   Irinotecan injection What is this medication? IRINOTECAN (ir in oh TEE kan ) is a chemotherapy drug. It is used to treat colon and rectal cancer. This medicine may be used for other purposes; ask your health care provider or pharmacist if you have questions. COMMON BRAND NAME(S): Camptosar What should I tell my care team before I take this medication? They need to know if you have any of these conditions: dehydration diarrhea infection (especially a virus infection such as chickenpox, cold sores, or herpes) liver disease low blood counts, like low white cell, platelet, or red cell counts low levels of calcium, magnesium, or potassium in the blood recent or ongoing radiation therapy an unusual or allergic reaction to irinotecan, other medicines, foods, dyes, or preservatives pregnant or trying to get pregnant breast-feeding How should I use this medication? This drug is given as an infusion into a vein. It is  administered in a hospital or clinic by a specially trained health care professional. Talk to your pediatrician regarding the use of this medicine in children. Special care may be needed. Overdosage: If you think you have taken too much of this medicine contact a poison control center or emergency room at once. NOTE: This medicine is only for you. Do not share this medicine with others. What if I miss a dose? It is important not to miss your dose. Call your doctor or health care professional if you are unable to keep an appointment. What may interact with this medication? Do not take this medicine with any of the following medications: cobicistat itraconazole This medicine may interact with the following medications: antiviral medicines for HIV or AIDS certain antibiotics like rifampin or rifabutin certain medicines for fungal infections like ketoconazole, posaconazole, and voriconazole certain medicines for seizures like carbamazepine, phenobarbital, phenotoin clarithromycin gemfibrozil nefazodone [facility]. John's Wort This list may not describe all possible interactions. Give your health care provider a list of all the medicines, herbs, non-prescription drugs, or dietary supplements you use. Also tell them if you smoke, drink alcohol, or use illegal drugs. Some items may interact with your medicine. What should I watch for while using this medication? Your condition will be monitored carefully while you are receiving this medicine. You will need important blood work done while you are taking this medicine. This drug may make you feel generally unwell. This is not uncommon, as chemotherapy can affect healthy cells as well as cancer cells. Report any side effects. Continue your course of treatment even though you feel  ill unless your doctor tells you to stop. In some cases, you may be given additional medicines to help with side effects. Follow all directions for their use. You may get drowsy or  dizzy. Do not drive, use machinery, or do anything that needs mental alertness until you know how this medicine affects you. Do not stand or sit up quickly, especially if you are an older patient. This reduces the risk of dizzy or fainting spells. Call your health care professional for advice if you get a fever, chills, or sore throat, or other symptoms of a cold or flu. Do not treat yourself. This medicine decreases your body's ability to fight infections. Try to avoid being around people who are sick. Avoid taking products that contain aspirin, acetaminophen, ibuprofen, naproxen, or ketoprofen unless instructed by your doctor. These medicines may hide a fever. This medicine may increase your risk to bruise or bleed. Call your doctor or health care professional if you notice any unusual bleeding. Be careful brushing and flossing your teeth or using a toothpick because you may get an infection or bleed more easily. If you have any dental work done, tell your dentist you are receiving this medicine. Do not become pregnant while taking this medicine or for 6 months after stopping it. Women should inform their health care professional if they wish to become pregnant or think they might be pregnant. Men should not father a child while taking this medicine and for 3 months after stopping it. There is potential for serious side effects to an unborn child. Talk to your health care professional for more information. Do not breast-feed an infant while taking this medicine or for 7 days after stopping it. This medicine has caused ovarian failure in some women. This medicine may make it more difficult to get pregnant. Talk to your health care professional if you are concerned about your fertility. This medicine has caused decreased sperm counts in some men. This may make it more difficult to father a child. Talk to your health care professional if you are concerned about your fertility. What side effects may I notice  from receiving this medication? Side effects that you should report to your doctor or health care professional as soon as possible: allergic reactions like skin rash, itching or hives, swelling of the face, lips, or tongue chest pain diarrhea flushing, runny nose, sweating during infusion low blood counts - this medicine may decrease the number of white blood cells, red blood cells and platelets. You may be at increased risk for infections and bleeding. nausea, vomiting pain, swelling, warmth in the leg signs of decreased platelets or bleeding - bruising, pinpoint red spots on the skin, black, tarry stools, blood in the urine signs of infection - fever or chills, cough, sore throat, pain or difficulty passing urine signs of decreased red blood cells - unusually weak or tired, fainting spells, lightheadedness Side effects that usually do not require medical attention (report to your doctor or health care professional if they continue or are bothersome): constipation hair loss headache loss of appetite mouth sores stomach pain This list may not describe all possible side effects. Call your doctor for medical advice about side effects. You may report side effects to FDA at 1-800-FDA-1088. Where should I keep my medication? This drug is given in a hospital or clinic and will not be stored at home. NOTE: This sheet is a summary. It may not cover all possible information. If you have questions about this medicine, talk  to your doctor, pharmacist, or health care provider.  2022 Elsevier/Gold Standard (2021-08-05 00:00:00)         To help prevent nausea and vomiting after your treatment, we encourage you to take your nausea medication as directed.  BELOW ARE SYMPTOMS THAT SHOULD BE REPORTED IMMEDIATELY: *FEVER GREATER THAN 100.4 F (38 C) OR HIGHER *CHILLS OR SWEATING *NAUSEA AND VOMITING THAT IS NOT CONTROLLED WITH YOUR NAUSEA MEDICATION *UNUSUAL SHORTNESS OF BREATH *UNUSUAL BRUISING  OR BLEEDING *URINARY PROBLEMS (pain or burning when urinating, or frequent urination) *BOWEL PROBLEMS (unusual diarrhea, constipation, pain near the anus) TENDERNESS IN MOUTH AND THROAT WITH OR WITHOUT PRESENCE OF ULCERS (sore throat, sores in mouth, or a toothache) UNUSUAL RASH, SWELLING OR PAIN  UNUSUAL VAGINAL DISCHARGE OR ITCHING   Items with * indicate a potential emergency and should be followed up as soon as possible or go to the Emergency Department if any problems should occur.  Please show the CHEMOTHERAPY ALERT CARD or IMMUNOTHERAPY ALERT CARD at check-in to the Emergency Department and triage nurse.  Should you have questions after your visit or need to cancel or reschedule your appointment, please contact [facility] CANCER [place] AT [place]  [phone] and follow the prompts.  Office hours are 8:00 a.m. to 4:30 p.m. Monday - Friday. Please note that voicemails left after 4:00 p.m. may not be returned until the following business day.  We are closed weekends and major holidays. You have access to a nurse at all times for urgent questions. Please call the main number to the clinic [phone] and follow the prompts.  For any non-urgent questions, you may also contact your provider using MyChart. We now offer e-Visits for anyone 66 and older to request care online for non-urgent symptoms. For details visit [email or web address].   Also download the MyChart app! Go to the app store, search "MyChart", open the app, select [facility], and log in with your MyChart username and password.  Due to Covid, a mask is required upon entering the hospital/clinic. If you do not have a mask, one will be given to you upon arrival. For doctor visits, patients may have 1 support person aged 60 or older with them. For treatment visits, patients cannot have anyone with them due to current Covid guidelines and our immunocompromised population.

## 2021-12-31 NOTE — Progress Notes (Signed)
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  She received IV Venofer for that.  I was concerned about malignancy given the degree of anemia and therefore obtain CT abdomen and pelvis with contrast CT showed 6.4 cm enhancing mass in the right liver and another mass measuring 4 cm.  Large volume stool in the right colon transverse and descending colon with irregular mass lesion in the distal sigmoid colon extending into the rectosigmoid junction with lateral extracolonic extension into the pelvic sidewall.  Apparent luminal narrowing at the level of the lesion which accounts for large volume of stool proximally.  Multiple pericolonic and perirectal lymph nodes.  Multiple peritoneal nodules are seen in the lower right pelvic sidewall.  Colon mass appears to be invading posterior uterus.Thrombosis of the superior rectal vein may be bland thrombus although direct tumor invasion not excluded.   Patient had a flexible sigmoidoscopy which showed a large obstructing mass in the rectosigmoid colon.  Scope could not be traversed beyond the obstruction.  Biopsies were taken and were consistent with adenocarcinoma. Patient had a diverting colostomy on 05/01/2021 with Dr. Dahlia Byes   Patient started palliative FOLFOX chemotherapy in June 2022 with plans to add Avastin after 6 cycles of  treatment.  Patient developed allergic reaction to oxaliplatin with cycle 10 as evidenced by diaphoresis vomiting and flushing.  Plan is to hold off on further doses of oxaliplatin.  She has K-ras mutation positive  Progression noted on scans in January 2023.  Plan to switch to FOLFIRI bevacizumab    Interval history-patient reports occasional diarrhea and need to change her colostomy bag frequently in the last few days.  Otherwise tolerating treatment well without any significant side effects  ECOG PS- 1 Pain scale- 0   Review of systems- Review of Systems  Constitutional:  Positive for malaise/fatigue. Negative for chills, fever and weight loss.  HENT:  Negative for congestion, ear discharge and nosebleeds.   Eyes:  Negative for blurred vision.  Respiratory:  Negative for cough, hemoptysis, sputum production, shortness of breath and wheezing.   Cardiovascular:  Negative for chest pain, palpitations, orthopnea and claudication.  Gastrointestinal:  Positive for diarrhea. Negative for abdominal pain, blood in stool, constipation, heartburn, melena, nausea and vomiting.  Genitourinary:  Negative for dysuria, flank pain, frequency, hematuria and urgency.  Musculoskeletal:  Negative for back pain, joint pain and myalgias.  Skin:  Negative for rash.  Neurological:  Negative for dizziness, tingling, focal weakness, seizures, weakness and headaches.  Endo/Heme/Allergies:  Does not bruise/bleed easily.  Psychiatric/Behavioral:  Negative for depression and suicidal ideas. The patient does not have insomnia.       Allergies  Allergen Reactions   Oxaliplatin Nausea And Vomiting     Past Medical History:  Diagnosis Date   Allergy    Anemia    Cancer ([place])      Past Surgical History:  Procedure Laterality Date   FLEXIBLE SIGMOIDOSCOPY N/A  04/24/2021   Procedure: FLEXIBLE SIGMOIDOSCOPY;  Surgeon: [name], MD;  Location[facility] ENDOSCOPY;  Service: Gastroenterology;  Laterality: N/A;    IR CV LINE INJECTION  11/13/2021   IR IMAGING GUIDED PORT INSERTION  11/19/2021   IR REMOVAL TUN ACCESS W/ PORT W/O FL MOD SED  11/19/2021   PORTACATH PLACEMENT N/A 05/01/2021   Procedure: INSERTION PORT-A-CATH;  Surgeon: Jules Husbands, MD;  Location: [facility] ORS;  Service: General;  Laterality: N/A;    Social History   Socioeconomic History   Marital status: Married    Spouse name: Not on file   Number of children: Not on file   Years of education: Not on file   Highest education level: Not on file  Occupational History   Not on file  Tobacco Use   Smoking status: Never   Smokeless tobacco: Never  Vaping Use   Vaping Use: Never used  Substance and Sexual Activity   Alcohol use: Not Currently   Drug use: Never   Sexual activity: Not Currently  Other Topics Concern   Not on file  Social History Narrative   Not on file   Social Determinants of Health   Financial Resource Strain: Not on file  Food Insecurity: Not on file  Transportation Needs: Not on file  Physical Activity: Not on file  Stress: Not on file  Social Connections: Not on file  Intimate Partner Violence: Not on file    Family History  Problem Relation Age of Onset   Stroke Mother    Hypertension Mother    Cancer Father      Current Outpatient Medications:    acetaminophen (TYLENOL) 325 MG tablet, Take 650 mg by mouth every 6 (six) hours as needed., Disp: , Rfl:    lidocaine-prilocaine (EMLA) cream, Apply 1 application topically as needed., Disp: 30 g, Rfl: 2   potassium chloride SA (KLOR-CON M) 20 MEQ tablet, Take 1 tablet (20 mEq total) by mouth daily., Disp: 21 tablet, Rfl: 0   folic acid (FOLVITE) 1 MG tablet, Take 1 tablet (1 mg total) by mouth daily. (Patient not taking: Reported on 11/12/2021), Disp: 30 tablet, Rfl: 3   oxycodone (OXY-IR) 5 MG capsule, Take 5 mg by mouth every 4 (four) hours as needed. (Patient not taking: Reported on 11/12/2021), Disp: , Rfl:  No current facility-administered  medications for this visit.  Facility-Administered Medications Ordered in Other Visits:    fluorouracil (ADRUCIL) 4,100 mg in sodium chloride 0.9 % 68 mL chemo infusion, 2,400 mg/m2 (Treatment Plan Recorded), Intravenous, 1 day or 1 dose, Sindy Guadeloupe, MD   fluorouracil (ADRUCIL) chemo injection 700 mg, 400 mg/m2 (Treatment Plan Recorded), Intravenous, Once, Sindy Guadeloupe, MD   irinotecan (CAMPTOSAR) 260 mg in sodium chloride 0.9 % 500 mL chemo infusion, 150 mg/m2 (Treatment Plan Recorded), Intravenous, Once, Sindy Guadeloupe, MD, Last Rate: 342 mL/hr at 12/31/21 1226, 260 mg at 12/31/21 1226   leucovorin 700 mg in sodium chloride 0.9 % 250 mL infusion, 409 mg/m2 (Treatment Plan Recorded), Intravenous, Once, Sindy Guadeloupe, MD, Last Rate: 190 mL/hr at 12/31/21 1227, 700 mg at 12/31/21 1227  Physical exam:  Vitals:   12/31/21 1034  BP: 116/80  Pulse: 98  Resp: 16  Temp: (!) 97 F (36.1 C)  TempSrc: Tympanic  SpO2: 98%  Weight: 125 lb 11.2 oz (57 kg)   Physical Exam Constitutional:      General: She is not in acute distress. Cardiovascular:     Rate and  Rhythm: Normal rate and regular rhythm.     Heart sounds: Normal heart sounds.  Pulmonary:     Effort: Pulmonary effort is normal.     Breath sounds: Normal breath sounds.  Abdominal:     General: Bowel sounds are normal.     Palpations: Abdomen is soft.     Comments: Colostomy in place  Skin:    General: Skin is warm and dry.  Neurological:     Mental Status: She is alert and oriented to person, place, and time.     CMP Latest Ref Rng & Units 12/31/2021  Glucose 70 - 99 mg/dL 110(H)  BUN 8 - 23 mg/dL 9  Creatinine 0.44 - 1.00 mg/dL 0.51  Sodium 135 - 145 mmol/L 135  Potassium 3.5 - 5.1 mmol/L 3.5  Chloride 98 - 111 mmol/L 103  CO2 22 - 32 mmol/L 24  Calcium 8.9 - 10.3 mg/dL 9.0  Total Protein 6.5 - 8.1 g/dL 7.3  Total Bilirubin 0.3 - 1.2 mg/dL 0.8  Alkaline Phos 38 - 126 U/L 77  AST 15 - 41 U/L 18  ALT 0 - 44 U/L 11    CBC Latest Ref Rng & Units 12/31/2021  WBC 4.0 - 10.5 K/uL 5.3  Hemoglobin 12.0 - 15.0 g/dL 10.2(L)  Hematocrit 36.0 - 46.0 % 32.2(L)  Platelets 150 - 400 K/uL 140(L)    No images are attached to the encounter.  CT CHEST ABDOMEN PELVIS W CONTRAST  Result Date: 12/15/2021 CLINICAL DATA:  Restaging metastatic colon cancer. EXAM: CT CHEST, ABDOMEN, AND PELVIS WITH CONTRAST TECHNIQUE: Multidetector CT imaging of the chest, abdomen and pelvis was performed following the standard protocol during bolus administration of intravenous contrast. RADIATION DOSE REDUCTION: This exam was performed according to the departmental dose-optimization program which includes automated exposure control, adjustment of the mA and/or kV according to patient size and/or use of iterative reconstruction technique. CONTRAST:  15m OMNIPAQUE IOHEXOL 300 MG/ML  SOLN COMPARISON:  08/21/2021 FINDINGS: CT CHEST FINDINGS Cardiovascular: The heart size appears within normal limits. There is no pericardial effusion identified. Mediastinum/Nodes: No enlarged mediastinal, hilar, or axillary lymph nodes. Thyroid gland, trachea, and esophagus demonstrate no significant findings. Lungs/Pleura: New subsegmental atelectasis within the right lower lobe. No pleural effusion or edema identified. No airspace consolidation. No suspicious lung nodules Musculoskeletal: No chest wall mass or suspicious bone lesions identified. CT ABDOMEN PELVIS FINDINGS Hepatobiliary: Signs of liver metastases are again noted. Within segment 6 there is a mass measuring 5.2 x 5.3 by 6.3 cm, image 64/2. Previously this was measured at 5.1 x 5.1 by 6.1 cm. No new liver metastases identified. Gallbladder is normal. No bile duct dilatation. Pancreas: Unremarkable. No pancreatic ductal dilatation or surrounding inflammatory changes. Spleen: Normal in size without focal abnormality. Adrenals/Urinary Tract: Normal adrenal glands. No nephrolithiasis, hydronephrosis or mass. Small  cyst within inferior pole of the left kidney measures 9 mm. Urinary bladder is unremarkable. Stomach/Bowel: Again seen are changes of left lower quadrant diverting loop descending colostomy. Large stool burden is again noted proximal to the ostomy site. Ulcerative infiltrative mass involving the sigmoid colon is again noted. This measures 4.7 by 5.0 by 6.3 cm, image 103/2. Formally this measured 4.4 x 4.5 by 5.6 cm. Vascular/Lymphatic: Normal appearance of the abdominal aorta. Abdominopelvic adenopathy is again noted. Left pelvic sidewall lymph node measures 1.6 x 1.4 cm, image 105/2. Previously 1.2 x 1.0 cm. Lymph node within the sigmoid mesocolon measures 1.5 x 1.3 cm, image 99/2. This is compared with  1.2 x 1.0 cm previously. Perirectal lymph node measures 1.1 x 1.0 cm, image 110/2. Previously 0.8 x 0.7 cm. Reproductive: The ulcerative mass involving the sigmoid colon extends up to the left side of uterine fundus. Cannot exclude tumor extension into the myometrium, image 108/2. Other: Trace perihepatic ascites. Musculoskeletal: No acute or significant osseous findings. IMPRESSION: 1. Mild progression of disease. There is been increase in size of ulcerative, infiltrative mass involving the sigmoid colon. 2. Mild increase in size of index lesion within segment 6 of liver. 3. Pelvic adenopathy is mildly increased in size in the interval. 4. No evidence for metastatic disease to the chest. Electronically Signed   By: Kerby Moors M.D.   On: 12/15/2021 14:38     Assessment and plan- Patient is a 66 y.o. female   with metastatic adenocarcinoma of the sigmoid colon/rectum with peritoneal and liver metastases.  She is here for on treatment assessment prior to cycle 14 of FOLFIRI bevacizumab chemotherapy  Counts okay to proceed with cycle 14 of FOLFIRI bevacizumab chemotherapy today.  Pump DC on day 3.  I will see her back in 2 weeks for cycle 15.  I have asked her to take as needed Imodium especially since she is  starting irinotecan.Blood pressure and urine protein otherwise acceptable to proceed with bevacizumab today.  Normocytic anemia: Overall stable continue to monitor   mild thrombocytopenia likely secondary to chemotherapy continue to monitor   Visit Diagnosis 1. Metastatic colon cancer in female [facility])   2. Encounter for antineoplastic chemotherapy   3. Antineoplastic chemotherapy induced anemia   4. Chemotherapy-induced thrombocytopenia      Dr. Randa Evens, MD, MPH [facility] at [facility] [phone] 12/31/2021 12:58 PM

## 2021-12-31 NOTE — Progress Notes (Signed)
Ok per Dr Janese Banks to proceed with tx using urine protein from 12/17/2021

## 2021-12-31 NOTE — Progress Notes (Signed)
Patient here she reports occasion bloating and gas.

## 2022-01-01 LAB — CEA: CEA: 176 ng/mL — ABNORMAL HIGH (ref 0.0–4.7)

## 2022-01-02 ENCOUNTER — Inpatient Hospital Stay: Payer: BC Managed Care – PPO

## 2022-01-02 ENCOUNTER — Other Ambulatory Visit: Payer: Self-pay

## 2022-01-02 DIAGNOSIS — Z5112 Encounter for antineoplastic immunotherapy: Secondary | ICD-10-CM | POA: Diagnosis not present

## 2022-01-02 DIAGNOSIS — C189 Malignant neoplasm of colon, unspecified: Secondary | ICD-10-CM

## 2022-01-02 MED ORDER — SODIUM CHLORIDE 0.9% FLUSH
10.0000 mL | INTRAVENOUS | Status: DC | PRN
  Administered 2022-01-02: 10 mL
  Filled 2022-01-02: qty 10

## 2022-01-02 MED ORDER — HEPARIN SOD (PORK) LOCK FLUSH 100 UNIT/ML IV SOLN
500.0000 [IU] | Freq: Once | INTRAVENOUS | Status: AC | PRN
  Administered 2022-01-02: 500 [IU]
  Filled 2022-01-02: qty 5

## 2022-01-06 ENCOUNTER — Telehealth: Payer: Self-pay | Admitting: Surgery

## 2022-01-06 NOTE — Telephone Encounter (Signed)
Patient is calling said she is needing colostomy bags patient said she is needing some more/. Please call patient and advise.

## 2022-01-13 ENCOUNTER — Other Ambulatory Visit: Payer: Self-pay | Admitting: *Deleted

## 2022-01-14 ENCOUNTER — Other Ambulatory Visit: Payer: Self-pay

## 2022-01-14 ENCOUNTER — Inpatient Hospital Stay (HOSPITAL_BASED_OUTPATIENT_CLINIC_OR_DEPARTMENT_OTHER): Payer: BC Managed Care – PPO | Admitting: Oncology

## 2022-01-14 ENCOUNTER — Inpatient Hospital Stay: Payer: BC Managed Care – PPO

## 2022-01-14 ENCOUNTER — Emergency Department: Payer: BC Managed Care – PPO

## 2022-01-14 ENCOUNTER — Encounter: Payer: Self-pay | Admitting: Oncology

## 2022-01-14 ENCOUNTER — Inpatient Hospital Stay
Admission: EM | Admit: 2022-01-14 | Discharge: 2022-01-17 | DRG: 375 | Disposition: A | Discharge status: Home / Self Care | Payer: BC Managed Care – PPO | Attending: Student in an Organized Health Care Education/Training Program | Admitting: Student in an Organized Health Care Education/Training Program

## 2022-01-14 VITALS — BP 100/70 | HR 94 | Temp 96.8°F | Resp 16 | Ht 66.0 in | Wt 124.0 lb

## 2022-01-14 DIAGNOSIS — Z5111 Encounter for antineoplastic chemotherapy: Secondary | ICD-10-CM

## 2022-01-14 DIAGNOSIS — Z933 Colostomy status: Secondary | ICD-10-CM

## 2022-01-14 DIAGNOSIS — D6481 Anemia due to antineoplastic chemotherapy: Secondary | ICD-10-CM

## 2022-01-14 DIAGNOSIS — D509 Iron deficiency anemia, unspecified: Secondary | ICD-10-CM | POA: Diagnosis present

## 2022-01-14 DIAGNOSIS — Z888 Allergy status to other drugs, medicaments and biological substances status: Secondary | ICD-10-CM

## 2022-01-14 DIAGNOSIS — C189 Malignant neoplasm of colon, unspecified: Secondary | ICD-10-CM | POA: Diagnosis not present

## 2022-01-14 DIAGNOSIS — Z5112 Encounter for antineoplastic immunotherapy: Secondary | ICD-10-CM | POA: Diagnosis not present

## 2022-01-14 DIAGNOSIS — D62 Acute posthemorrhagic anemia: Secondary | ICD-10-CM

## 2022-01-14 DIAGNOSIS — D500 Iron deficiency anemia secondary to blood loss (chronic): Secondary | ICD-10-CM

## 2022-01-14 DIAGNOSIS — K922 Gastrointestinal hemorrhage, unspecified: Principal | ICD-10-CM | POA: Diagnosis present

## 2022-01-14 DIAGNOSIS — T451X5A Adverse effect of antineoplastic and immunosuppressive drugs, initial encounter: Secondary | ICD-10-CM

## 2022-01-14 DIAGNOSIS — K921 Melena: Secondary | ICD-10-CM

## 2022-01-14 DIAGNOSIS — K7689 Other specified diseases of liver: Secondary | ICD-10-CM | POA: Diagnosis present

## 2022-01-14 DIAGNOSIS — Z8249 Family history of ischemic heart disease and other diseases of the circulatory system: Secondary | ICD-10-CM

## 2022-01-14 DIAGNOSIS — Z823 Family history of stroke: Secondary | ICD-10-CM

## 2022-01-14 DIAGNOSIS — Z20822 Contact with and (suspected) exposure to covid-19: Secondary | ICD-10-CM | POA: Diagnosis present

## 2022-01-14 LAB — COMPREHENSIVE METABOLIC PANEL
ALT: 13 U/L (ref 0–44)
ALT: 12 U/L (ref 0–44)
AST: 21 U/L (ref 15–41)
AST: 20 U/L (ref 15–41)
Albumin: 3 g/dL — ABNORMAL LOW (ref 3.5–5.0)
Albumin: 2.8 g/dL — ABNORMAL LOW (ref 3.5–5.0)
Alkaline Phosphatase: 80 U/L (ref 38–126)
Alkaline Phosphatase: 72 U/L (ref 38–126)
Anion gap: 4 — ABNORMAL LOW (ref 5–15)
Anion gap: 6 (ref 5–15)
BUN: 10 mg/dL (ref 8–23)
BUN: 14 mg/dL (ref 8–23)
CO2: 27 mmol/L (ref 22–32)
CO2: 24 mmol/L (ref 22–32)
Calcium: 8.7 mg/dL — ABNORMAL LOW (ref 8.9–10.3)
Calcium: 9 mg/dL (ref 8.9–10.3)
Chloride: 103 mmol/L (ref 98–111)
Chloride: 107 mmol/L (ref 98–111)
Creatinine, Ser: 0.45 mg/dL (ref 0.44–1.00)
Creatinine, Ser: 0.73 mg/dL (ref 0.44–1.00)
GFR, Estimated: >60 mL/min (ref >60)
GFR, Estimated: >60 mL/min (ref >60)
Glucose, Bld: 103 mg/dL — ABNORMAL HIGH (ref 70–99)
Glucose, Bld: 178 mg/dL — ABNORMAL HIGH (ref 70–99)
Potassium: 3.7 mmol/L (ref 3.5–5.1)
Potassium: 3.7 mmol/L (ref 3.5–5.1)
Sodium: 134 mmol/L — ABNORMAL LOW (ref 135–145)
Sodium: 137 mmol/L (ref 135–145)
Total Bilirubin: 0.2 mg/dL — ABNORMAL LOW (ref 0.3–1.2)
Total Bilirubin: 0.5 mg/dL (ref 0.3–1.2)
Total Protein: 6.6 g/dL (ref 6.5–8.1)
Total Protein: 6.5 g/dL (ref 6.5–8.1)

## 2022-01-14 LAB — CBC WITH DIFFERENTIAL/PLATELET
Abs Immature Granulocytes: 0.02 10*3/uL (ref 0.00–0.07)
Abs Immature Granulocytes: 0.01 10*3/uL (ref 0.00–0.07)
Basophils Absolute: 0.1 10*3/uL (ref 0.0–0.1)
Basophils Absolute: 0 10*3/uL (ref 0.0–0.1)
Basophils Relative: 1 %
Basophils Relative: 0 %
Eosinophils Absolute: 0.2 10*3/uL (ref 0.0–0.5)
Eosinophils Absolute: 0 10*3/uL (ref 0.0–0.5)
Eosinophils Relative: 4 %
Eosinophils Relative: 0 %
HCT: 30.5 % — ABNORMAL LOW (ref 36.0–46.0)
HCT: 30.1 % — ABNORMAL LOW (ref 36.0–46.0)
Hemoglobin: 9.7 g/dL — ABNORMAL LOW (ref 12.0–15.0)
Hemoglobin: 9.2 g/dL — ABNORMAL LOW (ref 12.0–15.0)
Immature Granulocytes: 0 %
Immature Granulocytes: 0 %
Lymphocytes Relative: 17 %
Lymphocytes Relative: 6 %
Lymphs Abs: 1 10*3/uL (ref 0.7–4.0)
Lymphs Abs: 0.3 10*3/uL — ABNORMAL LOW (ref 0.7–4.0)
MCH: 29.3 pg (ref 26.0–34.0)
MCH: 28.5 pg (ref 26.0–34.0)
MCHC: 31.8 g/dL (ref 30.0–36.0)
MCHC: 30.6 g/dL (ref 30.0–36.0)
MCV: 92.1 fL (ref 80.0–100.0)
MCV: 93.2 fL (ref 80.0–100.0)
Monocytes Absolute: 0.8 10*3/uL (ref 0.1–1.0)
Monocytes Absolute: 0.1 10*3/uL (ref 0.1–1.0)
Monocytes Relative: 14 %
Monocytes Relative: 2 %
Neutro Abs: 3.7 10*3/uL (ref 1.7–7.7)
Neutro Abs: 3.8 10*3/uL (ref 1.7–7.7)
Neutrophils Relative %: 64 %
Neutrophils Relative %: 92 %
Platelets: 154 10*3/uL (ref 150–400)
Platelets: 188 10*3/uL (ref 150–400)
RBC: 3.31 MIL/uL — ABNORMAL LOW (ref 3.87–5.11)
RBC: 3.23 MIL/uL — ABNORMAL LOW (ref 3.87–5.11)
RDW: 13.9 % (ref 11.5–15.5)
RDW: 13.7 % (ref 11.5–15.5)
WBC: 5.7 10*3/uL (ref 4.0–10.5)
WBC: 4.2 10*3/uL (ref 4.0–10.5)
nRBC: 0 % (ref 0.0–0.2)
nRBC: 0 % (ref 0.0–0.2)

## 2022-01-14 LAB — RESP PANEL BY RT-PCR (FLU A&B, COVID) ARPGX2
Influenza A by PCR: NEGATIVE
Influenza B by PCR: NEGATIVE
SARS Coronavirus 2 by RT PCR: NEGATIVE

## 2022-01-14 LAB — PROTIME-INR
INR: 1.1 (ref 0.8–1.2)
Prothrombin Time: 14.4 seconds (ref 11.4–15.2)

## 2022-01-14 LAB — PROTEIN, URINE, RANDOM: Total Protein, Urine: 14 mg/dL

## 2022-01-14 MED ORDER — ONDANSETRON HCL 4 MG PO TABS: 4.0000 mg | ORAL_TABLET | Freq: Four times a day (QID) | ORAL | Status: DC | PRN

## 2022-01-14 MED ORDER — FLUOROURACIL CHEMO INJECTION 2.5 GM/50ML
400.0000 mg/m2 | Freq: Once | INTRAVENOUS | Status: AC
  Administered 2022-01-14: 700 mg via INTRAVENOUS
  Filled 2022-01-14: qty 14

## 2022-01-14 MED ORDER — PANTOPRAZOLE 80MG IVPB - SIMPLE MED
80.0000 mg | Freq: Once | INTRAVENOUS | Status: AC
  Administered 2022-01-14: 80 mg via INTRAVENOUS
  Filled 2022-01-14 (×2): qty 100

## 2022-01-14 MED ORDER — PANTOPRAZOLE INFUSION (NEW) - SIMPLE MED
8.0000 mg/h | INTRAVENOUS | Status: DC
  Administered 2022-01-14 – 2022-01-15 (×3): 8 mg/h via INTRAVENOUS
  Filled 2022-01-14 (×5): qty 100

## 2022-01-14 MED ORDER — ACETAMINOPHEN 325 MG PO TABS: 650.0000 mg | ORAL_TABLET | Freq: Four times a day (QID) | ORAL | Status: DC | PRN

## 2022-01-14 MED ORDER — SODIUM CHLORIDE 0.9 % IV SOLN
10.0000 mg | Freq: Once | INTRAVENOUS | Status: AC
  Administered 2022-01-14: 10 mg via INTRAVENOUS
  Filled 2022-01-14: qty 10

## 2022-01-14 MED ORDER — SODIUM CHLORIDE 0.9 % IV SOLN
150.0000 mg/m2 | Freq: Once | INTRAVENOUS | Status: AC
  Administered 2022-01-14: 260 mg via INTRAVENOUS
  Filled 2022-01-14: qty 10

## 2022-01-14 MED ORDER — SODIUM CHLORIDE 0.9 % IV SOLN: INTRAVENOUS | Status: DC

## 2022-01-14 MED ORDER — ONDANSETRON HCL 4 MG/2ML IJ SOLN: 4.0000 mg | Freq: Four times a day (QID) | INTRAMUSCULAR | Status: DC | PRN

## 2022-01-14 MED ORDER — SODIUM CHLORIDE 0.9 % IV SOLN
Freq: Once | INTRAVENOUS | Status: AC
  Filled 2022-01-14: qty 250

## 2022-01-14 MED ORDER — SODIUM CHLORIDE 0.9 % IV SOLN
409.0000 mg/m2 | Freq: Once | INTRAVENOUS | Status: AC
  Administered 2022-01-14: 700 mg via INTRAVENOUS
  Filled 2022-01-14: qty 35

## 2022-01-14 MED ORDER — SODIUM CHLORIDE 0.9 % IV SOLN
5.0000 mg/kg | Freq: Once | INTRAVENOUS | Status: AC
  Administered 2022-01-14: 300 mg via INTRAVENOUS
  Filled 2022-01-14: qty 12

## 2022-01-14 MED ORDER — ACETAMINOPHEN 650 MG RE SUPP: 650.0000 mg | Freq: Four times a day (QID) | RECTAL | Status: DC | PRN

## 2022-01-14 MED ORDER — ATROPINE SULFATE 1 MG/ML IV SOLN
0.5000 mg | Freq: Once | INTRAVENOUS | Status: AC
  Administered 2022-01-14: 0.5 mg via INTRAVENOUS
  Filled 2022-01-14: qty 1

## 2022-01-14 MED ORDER — SODIUM CHLORIDE 0.9 % IV SOLN
2400.0000 mg/m2 | INTRAVENOUS | Status: DC
  Administered 2022-01-14: 4100 mg via INTRAVENOUS
  Filled 2022-01-14: qty 82

## 2022-01-14 MED ORDER — LIDOCAINE-PRILOCAINE 2.5-2.5 % EX CREA: 1.0000 "application " | TOPICAL_CREAM | CUTANEOUS | 2 refills | Status: DC | PRN

## 2022-01-14 MED ORDER — PANTOPRAZOLE SODIUM 40 MG IV SOLR: 40.0000 mg | Freq: Two times a day (BID) | INTRAVENOUS | Status: DC

## 2022-01-14 MED ORDER — IOHEXOL 350 MG/ML SOLN
100.0000 mL | Freq: Once | INTRAVENOUS | Status: AC | PRN
  Administered 2022-01-14: 100 mL via INTRAVENOUS

## 2022-01-14 NOTE — ED Notes (Signed)
Protonix ordered from pharmacy 

## 2022-01-14 NOTE — Patient Instructions (Signed)
[facility] AT [place]  Discharge Instructions: Thank you for choosing [facility] to provide your oncology and hematology care.  If you have a lab appointment with the [facility], please go directly to the [facility] and check in at the registration area.  Wear comfortable clothing and clothing appropriate for easy access to any Portacath or PICC line.   We strive to give you quality time with your provider. You may need to reschedule your appointment if you arrive late (15 or more minutes).  Arriving late affects you and other patients whose appointments are after yours.  Also, if you miss three or more appointments without notifying the office, you may be dismissed from the clinic at the providers discretion.      For prescription refill requests, have your pharmacy contact our office and allow 72 hours for refills to be completed.    Today you received the following chemotherapy and/or immunotherapy agents avastin, 5FU, irrinotecan, leucovorin      To help prevent nausea and vomiting after your treatment, we encourage you to take your nausea medication as directed.  BELOW ARE SYMPTOMS THAT SHOULD BE REPORTED IMMEDIATELY: *FEVER GREATER THAN 100.4 F (38 C) OR HIGHER *CHILLS OR SWEATING *NAUSEA AND VOMITING THAT IS NOT CONTROLLED WITH YOUR NAUSEA MEDICATION *UNUSUAL SHORTNESS OF BREATH *UNUSUAL BRUISING OR BLEEDING *URINARY PROBLEMS (pain or burning when urinating, or frequent urination) *BOWEL PROBLEMS (unusual diarrhea, constipation, pain near the anus) TENDERNESS IN MOUTH AND THROAT WITH OR WITHOUT PRESENCE OF ULCERS (sore throat, sores in mouth, or a toothache) UNUSUAL RASH, SWELLING OR PAIN  UNUSUAL VAGINAL DISCHARGE OR ITCHING   Items with * indicate a potential emergency and should be followed up as soon as possible or go to the Emergency Department if any problems should occur.  Please show the CHEMOTHERAPY ALERT CARD or IMMUNOTHERAPY  ALERT CARD at check-in to the Emergency Department and triage nurse.  Should you have questions after your visit or need to cancel or reschedule your appointment, please contact [facility] [place] [facility] [place]  [phone] and follow the prompts.  Office hours are 8:00 a.m. to 4:30 p.m. Monday - Friday. Please note that voicemails left after 4:00 p.m. may not be returned until the following business day.  We are closed weekends and major holidays. You have access to a nurse at all times for urgent questions. Please call the main number to the clinic [phone] and follow the prompts.  For any non-urgent questions, you may also contact your provider using MyChart. We now offer e-Visits for anyone 42 and older to request care online for non-urgent symptoms. For details visit [email or web address].   Also download the MyChart app! Go to the app store, search "MyChart", open the app, select [place], and log in with your MyChart username and password.  Due to Covid, a mask is required upon entering the hospital/clinic. If you do not have a mask, one will be given to you upon arrival. For doctor visits, patients may have 1 support person aged 79 or older with them. For treatment visits, patients cannot have anyone with them due to current Covid guidelines and our immunocompromised population.   Leucovorin injection What is this medication? LEUCOVORIN (loo koe VOR in) is used to prevent or treat the harmful effects of some medicines. This medicine is used to treat anemia caused by a low amount of folic acid in the body. It is also used with 5-fluorouracil (5-FU) to treat colon cancer.  This medicine may be used for other purposes; ask your health care provider or pharmacist if you have questions. What should I tell my care team before I take this medication? They need to know if you have any of these conditions: anemia from low levels of vitamin B-12 in the blood an unusual or allergic  reaction to leucovorin, folic acid, other medicines, foods, dyes, or preservatives pregnant or trying to get pregnant breast-feeding How should I use this medication? This medicine is for injection into a muscle or into a vein. It is given by a health care professional in a hospital or clinic setting. Talk to your pediatrician regarding the use of this medicine in children. Special care may be needed. Overdosage: If you think you have taken too much of this medicine contact a poison control center or emergency room at once. NOTE: This medicine is only for you. Do not share this medicine with others. What if I miss a dose? This does not apply. What may interact with this medication? capecitabine fluorouracil phenobarbital phenytoin primidone trimethoprim-sulfamethoxazole This list may not describe all possible interactions. Give your health care provider a list of all the medicines, herbs, non-prescription drugs, or dietary supplements you use. Also tell them if you smoke, drink alcohol, or use illegal drugs. Some items may interact with your medicine. What should I watch for while using this medication? Your condition will be monitored carefully while you are receiving this medicine. This medicine may increase the side effects of 5-fluorouracil, 5-FU. Tell your doctor or health care professional if you have diarrhea or mouth sores that do not get better or that get worse. What side effects may I notice from receiving this medication? Side effects that you should report to your doctor or health care professional as soon as possible: allergic reactions like skin rash, itching or hives, swelling of the face, lips, or tongue breathing problems fever, infection mouth sores unusual bleeding or bruising unusually weak or tired Side effects that usually do not require medical attention (report to your doctor or health care professional if they continue or are bothersome): constipation or  diarrhea loss of appetite nausea, vomiting This list may not describe all possible side effects. Call your doctor for medical advice about side effects. You may report side effects to FDA at 1-800-FDA-1088. Where should I keep my medication? This drug is given in a hospital or clinic and will not be stored at home. NOTE: This sheet is a summary. It may not cover all possible information. If you have questions about this medicine, talk to your doctor, pharmacist, or health care provider.  2022 Elsevier/Gold Standard (2008-05-24 00:00:00)   Irinotecan injection What is this medication? IRINOTECAN (ir in oh TEE kan ) is a chemotherapy drug. It is used to treat colon and rectal cancer. This medicine may be used for other purposes; ask your health care provider or pharmacist if you have questions. COMMON BRAND NAME(S): Camptosar What should I tell my care team before I take this medication? They need to know if you have any of these conditions: dehydration diarrhea infection (especially a virus infection such as chickenpox, cold sores, or herpes) liver disease low blood counts, like low white cell, platelet, or red cell counts low levels of calcium, magnesium, or potassium in the blood recent or ongoing radiation therapy an unusual or allergic reaction to irinotecan, other medicines, foods, dyes, or preservatives pregnant or trying to get pregnant breast-feeding How should I use this medication? This drug is  given as an infusion into a vein. It is administered in a hospital or clinic by a specially trained health care professional. Talk to your pediatrician regarding the use of this medicine in children. Special care may be needed. Overdosage: If you think you have taken too much of this medicine contact a poison control center or emergency room at once. NOTE: This medicine is only for you. Do not share this medicine with others. What if I miss a dose? It is important not to miss your  dose. Call your doctor or health care professional if you are unable to keep an appointment. What may interact with this medication? Do not take this medicine with any of the following medications: cobicistat itraconazole This medicine may interact with the following medications: antiviral medicines for HIV or AIDS certain antibiotics like rifampin or rifabutin certain medicines for fungal infections like ketoconazole, posaconazole, and voriconazole certain medicines for seizures like carbamazepine, phenobarbital, phenotoin clarithromycin gemfibrozil nefazodone St. John's Wort This list may not describe all possible interactions. Give your health care provider a list of all the medicines, herbs, non-prescription drugs, or dietary supplements you use. Also tell them if you smoke, drink alcohol, or use illegal drugs. Some items may interact with your medicine. What should I watch for while using this medication? Your condition will be monitored carefully while you are receiving this medicine. You will need important blood work done while you are taking this medicine. This drug may make you feel generally unwell. This is not uncommon, as chemotherapy can affect healthy cells as well as cancer cells. Report any side effects. Continue your course of treatment even though you feel ill unless your doctor tells you to stop. In some cases, you may be given additional medicines to help with side effects. Follow all directions for their use. You may get drowsy or dizzy. Do not drive, use machinery, or do anything that needs mental alertness until you know how this medicine affects you. Do not stand or sit up quickly, especially if you are an older patient. This reduces the risk of dizzy or fainting spells. Call your health care professional for advice if you get a fever, chills, or sore throat, or other symptoms of a cold or flu. Do not treat yourself. This medicine decreases your body's ability to fight  infections. Try to avoid being around people who are sick. Avoid taking products that contain aspirin, acetaminophen, ibuprofen, naproxen, or ketoprofen unless instructed by your doctor. These medicines may hide a fever. This medicine may increase your risk to bruise or bleed. Call your doctor or health care professional if you notice any unusual bleeding. Be careful brushing and flossing your teeth or using a toothpick because you may get an infection or bleed more easily. If you have any dental work done, tell your dentist you are receiving this medicine. Do not become pregnant while taking this medicine or for 6 months after stopping it. Women should inform their health care professional if they wish to become pregnant or think they might be pregnant. Men should not father a child while taking this medicine and for 3 months after stopping it. There is potential for serious side effects to an unborn child. Talk to your health care professional for more information. Do not breast-feed an infant while taking this medicine or for 7 days after stopping it. This medicine has caused ovarian failure in some women. This medicine may make it more difficult to get pregnant. Talk to your health care  professional if you are concerned about your fertility. This medicine has caused decreased sperm counts in some men. This may make it more difficult to father a child. Talk to your health care professional if you are concerned about your fertility. What side effects may I notice from receiving this medication? Side effects that you should report to your doctor or health care professional as soon as possible: allergic reactions like skin rash, itching or hives, swelling of the face, lips, or tongue chest pain diarrhea flushing, runny nose, sweating during infusion low blood counts - this medicine may decrease the number of white blood cells, red blood cells and platelets. You may be at increased risk for infections  and bleeding. nausea, vomiting pain, swelling, warmth in the leg signs of decreased platelets or bleeding - bruising, pinpoint red spots on the skin, black, tarry stools, blood in the urine signs of infection - fever or chills, cough, sore throat, pain or difficulty passing urine signs of decreased red blood cells - unusually weak or tired, fainting spells, lightheadedness Side effects that usually do not require medical attention (report to your doctor or health care professional if they continue or are bothersome): constipation hair loss headache loss of appetite mouth sores stomach pain This list may not describe all possible side effects. Call your doctor for medical advice about side effects. You may report side effects to FDA at 1-800-FDA-1088. Where should I keep my medication? This drug is given in a hospital or clinic and will not be stored at home. NOTE: This sheet is a summary. It may not cover all possible information. If you have questions about this medicine, talk to your doctor, pharmacist, or health care provider.  2022 Elsevier/Gold Standard (2021-08-05 00:00:00)   Fluorouracil, 5-FU injection What is this medication? FLUOROURACIL, 5-FU (flure oh YOOR a sil) is a chemotherapy drug. It slows the growth of cancer cells. This medicine is used to treat many types of cancer like breast cancer, colon or rectal cancer, pancreatic cancer, and stomach cancer. This medicine may be used for other purposes; ask your health care provider or pharmacist if you have questions. COMMON BRAND NAME(S): Adrucil What should I tell my care team before I take this medication? They need to know if you have any of these conditions: blood disorders dihydropyrimidine dehydrogenase (DPD) deficiency infection (especially a virus infection such as chickenpox, cold sores, or herpes) kidney disease liver disease malnourished, poor nutrition recent or ongoing radiation therapy an unusual or  allergic reaction to fluorouracil, other chemotherapy, other medicines, foods, dyes, or preservatives pregnant or trying to get pregnant breast-feeding How should I use this medication? This drug is given as an infusion or injection into a vein. It is administered in a hospital or clinic by a specially trained health care professional. Talk to your pediatrician regarding the use of this medicine in children. Special care may be needed. Overdosage: If you think you have taken too much of this medicine contact a poison control center or emergency room at once. NOTE: This medicine is only for you. Do not share this medicine with others. What if I miss a dose? It is important not to miss your dose. Call your doctor or health care professional if you are unable to keep an appointment. What may interact with this medication? Do not take this medicine with any of the following medications: live virus vaccines This medicine may also interact with the following medications: medicines that treat or prevent blood clots like warfarin, enoxaparin,  and dalteparin This list may not describe all possible interactions. Give your health care provider a list of all the medicines, herbs, non-prescription drugs, or dietary supplements you use. Also tell them if you smoke, drink alcohol, or use illegal drugs. Some items may interact with your medicine. What should I watch for while using this medication? Visit your doctor for checks on your progress. This drug may make you feel generally unwell. This is not uncommon, as chemotherapy can affect healthy cells as well as cancer cells. Report any side effects. Continue your course of treatment even though you feel ill unless your doctor tells you to stop. In some cases, you may be given additional medicines to help with side effects. Follow all directions for their use. Call your doctor or health care professional for advice if you get a fever, chills or sore throat, or  other symptoms of a cold or flu. Do not treat yourself. This drug decreases your body's ability to fight infections. Try to avoid being around people who are sick. This medicine may increase your risk to bruise or bleed. Call your doctor or health care professional if you notice any unusual bleeding. Be careful brushing and flossing your teeth or using a toothpick because you may get an infection or bleed more easily. If you have any dental work done, tell your dentist you are receiving this medicine. Avoid taking products that contain aspirin, acetaminophen, ibuprofen, naproxen, or ketoprofen unless instructed by your doctor. These medicines may hide a fever. Do not become pregnant while taking this medicine. Women should inform their doctor if they wish to become pregnant or think they might be pregnant. There is a potential for serious side effects to an unborn child. Talk to your health care professional or pharmacist for more information. Do not breast-feed an infant while taking this medicine. Men should inform their doctor if they wish to father a child. This medicine may lower sperm counts. Do not treat diarrhea with over the counter products. Contact your doctor if you have diarrhea that lasts more than 2 days or if it is severe and watery. This medicine can make you more sensitive to the sun. Keep out of the sun. If you cannot avoid being in the sun, wear protective clothing and use sunscreen. Do not use sun lamps or tanning beds/booths. What side effects may I notice from receiving this medication? Side effects that you should report to your doctor or health care professional as soon as possible: allergic reactions like skin rash, itching or hives, swelling of the face, lips, or tongue low blood counts - this medicine may decrease the number of white blood cells, red blood cells and platelets. You may be at increased risk for infections and bleeding. signs of infection - fever or chills,  cough, sore throat, pain or difficulty passing urine signs of decreased platelets or bleeding - bruising, pinpoint red spots on the skin, black, tarry stools, blood in the urine signs of decreased red blood cells - unusually weak or tired, fainting spells, lightheadedness breathing problems changes in vision chest pain mouth sores nausea and vomiting pain, swelling, redness at site where injected pain, tingling, numbness in the hands or feet redness, swelling, or sores on hands or feet stomach pain unusual bleeding Side effects that usually do not require medical attention (report to your doctor or health care professional if they continue or are bothersome): changes in finger or toe nails diarrhea dry or itchy skin hair loss headache loss of  appetite sensitivity of eyes to the light stomach upset unusually teary eyes This list may not describe all possible side effects. Call your doctor for medical advice about side effects. You may report side effects to FDA at 1-800-FDA-1088. Where should I keep my medication? This drug is given in a hospital or clinic and will not be stored at home. NOTE: This sheet is a summary. It may not cover all possible information. If you have questions about this medicine, talk to your doctor, pharmacist, or health care provider.  2022 Elsevier/Gold Standard (2021-08-05 00:00:00)  Bevacizumab injection What is this medication? BEVACIZUMAB (be va SIZ yoo mab) is a monoclonal antibody. It is used to treat many types of cancer. This medicine may be used for other purposes; ask your health care provider or pharmacist if you have questions. COMMON BRAND NAME(S): Alymsys, Avastin, MVASI, Noah Charon What should I tell my care team before I take this medication? They need to know if you have any of these conditions: diabetes heart disease high blood pressure history of coughing up blood prior anthracycline chemotherapy (e.g., doxorubicin, daunorubicin,  epirubicin) recent or ongoing radiation therapy recent or planning to have surgery stroke an unusual or allergic reaction to bevacizumab, hamster proteins, mouse proteins, other medicines, foods, dyes, or preservatives pregnant or trying to get pregnant breast-feeding How should I use this medication? This medicine is for infusion into a vein. It is given by a health care professional in a hospital or clinic setting. Talk to your pediatrician regarding the use of this medicine in children. Special care may be needed. Overdosage: If you think you have taken too much of this medicine contact a poison control center or emergency room at once. NOTE: This medicine is only for you. Do not share this medicine with others. What if I miss a dose? It is important not to miss your dose. Call your doctor or health care professional if you are unable to keep an appointment. What may interact with this medication? Interactions are not expected. This list may not describe all possible interactions. Give your health care provider a list of all the medicines, herbs, non-prescription drugs, or dietary supplements you use. Also tell them if you smoke, drink alcohol, or use illegal drugs. Some items may interact with your medicine. What should I watch for while using this medication? Your condition will be monitored carefully while you are receiving this medicine. You will need important blood work and urine testing done while you are taking this medicine. This medicine may increase your risk to bruise or bleed. Call your doctor or health care professional if you notice any unusual bleeding. Before having surgery, talk to your health care provider to make sure it is ok. This drug can increase the risk of poor healing of your surgical site or wound. You will need to stop this drug for 28 days before surgery. After surgery, wait at least 28 days before restarting this drug. Make sure the surgical site or wound is  healed enough before restarting this drug. Talk to your health care provider if questions. Do not become pregnant while taking this medicine or for 6 months after stopping it. Women should inform their doctor if they wish to become pregnant or think they might be pregnant. There is a potential for serious side effects to an unborn child. Talk to your health care professional or pharmacist for more information. Do not breast-feed an infant while taking this medicine and for 6 months after the last dose.  This medicine has caused ovarian failure in some women. This medicine may interfere with the ability to have a child. You should talk to your doctor or health care professional if you are concerned about your fertility. What side effects may I notice from receiving this medication? Side effects that you should report to your doctor or health care professional as soon as possible: allergic reactions like skin rash, itching or hives, swelling of the face, lips, or tongue chest pain or chest tightness chills coughing up blood high fever seizures severe constipation signs and symptoms of bleeding such as bloody or black, tarry stools; red or dark-brown urine; spitting up blood or brown material that looks like coffee grounds; red spots on the skin; unusual bruising or bleeding from the eye, gums, or nose signs and symptoms of a blood clot such as breathing problems; chest pain; severe, sudden headache; pain, swelling, warmth in the leg signs and symptoms of a stroke like changes in vision; confusion; trouble speaking or understanding; severe headaches; sudden numbness or weakness of the face, arm or leg; trouble walking; dizziness; loss of balance or coordination stomach pain sweating swelling of legs or ankles vomiting weight gain Side effects that usually do not require medical attention (report to your doctor or health care professional if they continue or are bothersome): back pain changes in  taste decreased appetite dry skin nausea tiredness This list may not describe all possible side effects. Call your doctor for medical advice about side effects. You may report side effects to FDA at 1-800-FDA-1088. Where should I keep my medication? This drug is given in a hospital or clinic and will not be stored at home. NOTE: This sheet is a summary. It may not cover all possible information. If you have questions about this medicine, talk to your doctor, pharmacist, or health care provider.  2022 Elsevier/Gold Standard (2021-08-05 00:00:00)

## 2022-01-14 NOTE — Assessment & Plan Note (Signed)
On palliative chemotherapy Oncology consult in the a.m.

## 2022-01-14 NOTE — ED Notes (Signed)
Pt is changed into clean ostomy bag

## 2022-01-14 NOTE — ED Triage Notes (Signed)
Report per EMS, Bleeding from ostomy and rectal x 1 hr 30 min. Pt called family when bleeding episode started. Per EMS, 30 ml blood noted on bathroom floor. Pt denies any syncopal episodes. Pt currently alert and oriented. Colon cancer pt.

## 2022-01-14 NOTE — ED Provider Notes (Signed)
[facility] Provider Note    Event Date/Time   First MD Initiated Contact with Patient 01/14/22 1948     (approximate)   History   GI Bleeding   HPI  Alicia Coleman is a 66 y.o. female with past medical history of iron deficiency anemia and colon cancer started on palliative chemotherapy status post colostomy who presents with EMS from home for evaluation of some fairly sudden onset of some bleeding from her ostomy and from rectum that occurred earlier today.  She states she has never had any episode like this before.  She is on any blood thinners.  She does not take NSAIDs or drink alcohol.  She states she is otherwise been in her usual state of health without any recent fevers, chills, cough, vomiting, diarrhea, rash or any other similar episodes.  No other acute concerns at this time.      Physical Exam  Triage Vital Signs: ED Triage Vitals  Enc Vitals Group     BP      Pulse      Resp      Temp      Temp src      SpO2      Weight      Height      Head Circumference      Peak Flow      Pain Score      Pain Loc      Pain Edu?      Excl. in [place]?     Most recent vital signs: Vitals:   01/14/22 2000 01/14/22 2123  BP: 115/85 120/78  Pulse: 96 81  Resp: 17 16  Temp:    SpO2: 100% 100%    General: Awake, no distress.  CV:  Good peripheral perfusion.  No murmurs. Resp:  Normal effort.  Abd:  No distention.  Soft.  There are some blood clots around the ostomy site but no active bleeding.  There is also some fresh blood around the rectum without any active bleeding or obvious source of bleeding. Other:  Patient is awake and alert.   ED Results / Procedures / Treatments  Labs (all labs ordered are listed, but only abnormal results are displayed) Labs Reviewed  CBC WITH DIFFERENTIAL/PLATELET - Abnormal; Notable for the following components:      Result Value   RBC 3.23 (*)    Hemoglobin 9.2 (*)    HCT 30.1 (*)    Lymphs Abs 0.3 (*)     All other components within normal limits  COMPREHENSIVE METABOLIC PANEL - Abnormal; Notable for the following components:   Glucose, Bld 178 (*)    Albumin 2.8 (*)    All other components within normal limits  RESP PANEL BY RT-PCR (FLU A&B, COVID) ARPGX2  PROTIME-INR  TYPE AND SCREEN     EKG    RADIOLOGY  CT abdomen pelvis interpreted by myself shows no active extravasation and appearance of a large colonic mass.  Also reviewed radiologist interpretation and agree with the findings of signs of large colonic mass and concerning findings for bleeding from tumor.  There are signs of metastatic disease and local invasion.  PROCEDURES:  Critical Care performed: No  .1-3 Lead EKG Interpretation Performed by: Lucrezia Starch, MD Authorized by: Lucrezia Starch, MD     Interpretation: normal     ECG rate assessment: normal     Rhythm: sinus rhythm     Ectopy: none  Conduction: normal    The patient is on the cardiac monitor to evaluate for evidence of arrhythmia and/or significant heart rate changes.   MEDICATIONS ORDERED IN ED: Medications  pantoprozole (PROTONIX) 80 mg /NS 100 mL infusion (8 mg/hr Intravenous New Bag/Given 01/14/22 2122)  pantoprazole (PROTONIX) injection 40 mg (has no administration in time range)  pantoprazole (PROTONIX) 80 mg /NS 100 mL IVPB (0 mg Intravenous Stopped 01/14/22 2115)  iohexol (OMNIPAQUE) 350 MG/ML injection 100 mL (100 mLs Intravenous Contrast Given 01/14/22 2029)     IMPRESSION / MDM / ASSESSMENT AND PLAN / ED COURSE  I reviewed the triage vital signs and the nursing notes.                              Differential diagnosis includes, but is not limited to GI bleed from malignancy, AVM, versus other etiologies such as ulcerative erosions.  CBC systemic cytosis and hemoglobin 9.2 compared to 9.7 yesterday.  Platelets are within normal limits.  CMP shows a glucose of 178 without any other significant electrolyte or metabolic  derangements.  Albumin is 2.8.  INR is 1.1.  COVID influenza PCR is negative.  CT abdomen pelvis interpreted by myself shows no active extravasation and appearance of a large colonic mass.  Also reviewed radiologist interpretation and agree with the findings of signs of large colonic mass and concerning findings for bleeding from tumor.  There are signs of metastatic disease and local invasion.  Dellis Anes think of any active bleeding given this is a new bleeding likely from tumor and with patient currently living alone I think it is reasonable to observe her overnight to ensure she does not have any significant recurrence and have her hemoglobin rechecked in the morning.  I discussed this with patient and she is amenable with plan.  I also discussed with hospitalist who is in agreement we will place admission orders.         FINAL CLINICAL IMPRESSION(S) / ED DIAGNOSES   Final diagnoses:  Gastrointestinal hemorrhage, unspecified gastrointestinal hemorrhage type     Rx / DC Orders   ED Discharge Orders     None        Note:  This document was prepared using Dragon voice recognition software and may include unintentional dictation errors.   Lucrezia Starch, MD 01/14/22 [id number]

## 2022-01-14 NOTE — Assessment & Plan Note (Signed)
Hemoglobin appears stable at this time at 9.2, baseline 9.5-10.2 Serial H&H Type and cross and transfuse if needed

## 2022-01-14 NOTE — Assessment & Plan Note (Addendum)
CTA abdomen and pelvis suggesting tumor related bleeding GI consulted Consider vascular consult Palliative care consult

## 2022-01-14 NOTE — ED Notes (Signed)
Patient transported to CT 

## 2022-01-14 NOTE — H&P (Signed)
History and Physical    Patient: Alicia Coleman NAT:[id number] DOB: 04-28-1956 DOA: 01/14/2022 DOS: the patient was seen and examined on 01/14/2022 PCP: Pcp, No  Patient coming from: Home  Chief Complaint:  Chief Complaint  Patient presents with   GI Bleeding    HPI: Alicia Coleman is a 66 y.o. female with medical history significant of Metastatic colon cancer diagnosed June 2022 now with colostomy, on palliative chemotherapy since June 4270 complicated by an allergic reaction, recently restarted on chemotherapy, most recently evaluated by oncology on 12/31/2021 who presents to the ED with a complaint of seeing blood in her colostomy bag and rectal bleeding.  She endorses fatigue but denies chest pain, shortness of breath or palpitations.  Denies abdominal pain.  On arrival mildly tachycardic at 102 with BP 134/112 with otherwise normal vitals Blood work significant for hemoglobin of 9.2, baseline 9.5-10.2  EKG personally viewed and interpreted: With normal sinus rhythm with no acute ST-T wave changes  CTA abdomen and pelvis:...... findings compatible with tumor related bleeding  Patient was started on a Protonix bolus and infusion Hospitalist consulted for admission.  Review of Systems: As mentioned in the history of present illness. All other systems reviewed and are negative. Past Medical History:  Diagnosis Date   Allergy    Anemia    Cancer [facility])    Past Surgical History:  Procedure Laterality Date   FLEXIBLE SIGMOIDOSCOPY N/A 04/24/2021   Procedure: FLEXIBLE SIGMOIDOSCOPY;  Surgeon: [name], MD;  Location: [facility] ENDOSCOPY;  Service: Gastroenterology;  Laterality: N/A;   IR CV LINE INJECTION  11/13/2021   IR IMAGING GUIDED PORT INSERTION  11/19/2021   IR REMOVAL TUN ACCESS W/ PORT W/O FL MOD SED  11/19/2021   PORTACATH PLACEMENT N/A 05/01/2021   Procedure: INSERTION PORT-A-CATH;  Surgeon: Jules Husbands, MD;  Location: [facility] ORS;  Service: General;  Laterality: N/A;   Social  History:  reports that she has never smoked. She has never used smokeless tobacco. She reports that she does not currently use alcohol. She reports that she does not use drugs.  Allergies  Allergen Reactions   Oxaliplatin Nausea And Vomiting    Family History  Problem Relation Age of Onset   Stroke Mother    Hypertension Mother    Cancer Father     Prior to Admission medications   Medication Sig Start Date End Date Taking? Authorizing Provider  lidocaine-prilocaine (EMLA) cream Apply 1 application topically as needed. 01/14/22  Yes Sindy Guadeloupe, MD  acetaminophen (TYLENOL) 325 MG tablet Take 650 mg by mouth every 6 (six) hours as needed. Patient not taking: Reported on 01/14/2022    [provider]  folic acid (FOLVITE) 1 MG tablet Take 1 tablet (1 mg total) by mouth daily. Patient not taking: Reported on 11/12/2021 05/19/21   Sindy Guadeloupe, MD  oxycodone (OXY-IR) 5 MG capsule Take 5 mg by mouth every 4 (four) hours as needed. Patient not taking: Reported on 11/12/2021    [provider]  potassium chloride SA (KLOR-CON M) 20 MEQ tablet Take 1 tablet (20 mEq total) by mouth daily. Patient not taking: Reported on 01/14/2022 12/03/21   Sindy Guadeloupe, MD    Physical Exam: Vitals:   01/14/22 1955 01/14/22 1957 01/14/22 2000 01/14/22 2123  BP: (!) 134/112  115/85 120/78  Pulse: (!) 102  96 81  Resp: 16  17 16   Temp:  98.1 F (36.7 C)    TempSrc:  Oral    SpO2:  100%  100% 100%  Weight:      Height:       Physical Exam Vitals and nursing note reviewed.  Constitutional:      General: She is not in acute distress.    Appearance: Normal appearance.  HENT:     Head: Normocephalic and atraumatic.  Cardiovascular:     Rate and Rhythm: Normal rate and regular rhythm.     Pulses: Normal pulses.     Heart sounds: Normal heart sounds. No murmur heard. Pulmonary:     Effort: Pulmonary effort is normal.     Breath sounds: Normal breath sounds. No wheezing or rhonchi.   Abdominal:     General: Bowel sounds are normal.     Palpations: Abdomen is soft.     Tenderness: There is no abdominal tenderness.     Comments: Colostomy with small amount of dark blood in bag  Musculoskeletal:        General: No swelling or tenderness. Normal range of motion.     Cervical back: Normal range of motion and neck supple.     Right lower leg: Edema present.     Left lower leg: Edema present.  Skin:    General: Skin is warm and dry.  Neurological:     General: No focal deficit present.     Mental Status: She is alert. Mental status is at baseline.  Psychiatric:        Mood and Affect: Mood normal.        Behavior: Behavior normal.     Data Reviewed: Notes from primary care and specialist visits, past discharge summaries. Prior diagnostic testing as applicable to current admission diagnoses Updated medications and problem lists for reconciliation ED course, including vitals, labs, imaging, treatment and response to treatment Triage notes and ED providers notes   Assessment and Plan: * Hematochezia CTA abdomen and pelvis suggesting tumor related bleeding GI consulted Consider vascular consult Palliative care consult  Iron deficiency anemia- (present on admission) Hemoglobin appears stable at this time at 9.2, baseline 9.5-10.2 Serial H&H Type and cross and transfuse if needed   Colostomy status ([place]) Routine colostomy care  Metastatic colon cancer in female [facility])- (present on admission) On palliative chemotherapy Oncology consult in the a.m.       Advance Care Planning:   Code Status: Full Code   Consults: GI  Family Communication: none  Severity of Illness: The appropriate patient status for this patient is INPATIENT. Inpatient status is judged to be reasonable and necessary in order to provide the required intensity of service to ensure the patient's safety. The patient's presenting symptoms, physical exam findings, and initial radiographic  and laboratory data in the context of their chronic comorbidities is felt to place them at high risk for further clinical deterioration. Furthermore, it is not anticipated that the patient will be medically stable for discharge from the hospital within 2 midnights of admission.   * I certify that at the point of admission it is my clinical judgment that the patient will require inpatient hospital care spanning beyond 2 midnights from the point of admission due to high intensity of service, high risk for further deterioration and high frequency of surveillance required.*  Author: Athena Masse, MD 01/14/2022 11:31 PM  For on call review [email or web address].

## 2022-01-14 NOTE — Assessment & Plan Note (Signed)
Routine colostomy care

## 2022-01-14 NOTE — Progress Notes (Signed)
Pt tolerated all infusions well today with no problems or complaints.  Pt left chemo suite stable and ambulatory with her 5FU pump infusing as ordered.

## 2022-01-15 ENCOUNTER — Other Ambulatory Visit: Payer: Self-pay

## 2022-01-15 DIAGNOSIS — K921 Melena: Secondary | ICD-10-CM | POA: Diagnosis present

## 2022-01-15 DIAGNOSIS — K7689 Other specified diseases of liver: Secondary | ICD-10-CM | POA: Diagnosis present

## 2022-01-15 DIAGNOSIS — K922 Gastrointestinal hemorrhage, unspecified: Secondary | ICD-10-CM | POA: Diagnosis not present

## 2022-01-15 DIAGNOSIS — D508 Other iron deficiency anemias: Secondary | ICD-10-CM

## 2022-01-15 DIAGNOSIS — D500 Iron deficiency anemia secondary to blood loss (chronic): Secondary | ICD-10-CM | POA: Diagnosis not present

## 2022-01-15 DIAGNOSIS — Z5112 Encounter for antineoplastic immunotherapy: Secondary | ICD-10-CM | POA: Diagnosis not present

## 2022-01-15 DIAGNOSIS — D62 Acute posthemorrhagic anemia: Secondary | ICD-10-CM

## 2022-01-15 DIAGNOSIS — Z933 Colostomy status: Secondary | ICD-10-CM | POA: Diagnosis not present

## 2022-01-15 DIAGNOSIS — Z20822 Contact with and (suspected) exposure to covid-19: Secondary | ICD-10-CM | POA: Diagnosis present

## 2022-01-15 DIAGNOSIS — Z823 Family history of stroke: Secondary | ICD-10-CM | POA: Diagnosis not present

## 2022-01-15 DIAGNOSIS — Z8249 Family history of ischemic heart disease and other diseases of the circulatory system: Secondary | ICD-10-CM | POA: Diagnosis not present

## 2022-01-15 DIAGNOSIS — C189 Malignant neoplasm of colon, unspecified: Secondary | ICD-10-CM | POA: Diagnosis present

## 2022-01-15 DIAGNOSIS — Z888 Allergy status to other drugs, medicaments and biological substances status: Secondary | ICD-10-CM | POA: Diagnosis not present

## 2022-01-15 LAB — HEMOGLOBIN AND HEMATOCRIT, BLOOD
HCT: 22.8 % — ABNORMAL LOW (ref 36.0–46.0)
HCT: 25.6 % — ABNORMAL LOW (ref 36.0–46.0)
Hemoglobin: 7.1 g/dL — ABNORMAL LOW (ref 12.0–15.0)
Hemoglobin: 8.4 g/dL — ABNORMAL LOW (ref 12.0–15.0)

## 2022-01-15 LAB — PREPARE RBC (CROSSMATCH)

## 2022-01-15 LAB — CEA: CEA: 211 ng/mL — ABNORMAL HIGH (ref 0.0–4.7)

## 2022-01-15 MED ORDER — PEG 3350-KCL-NA BICARB-NACL 420 G PO SOLR
4000.0000 mL | Freq: Once | ORAL | Status: AC
  Administered 2022-01-15: 4000 mL via ORAL
  Filled 2022-01-15: qty 4000

## 2022-01-15 MED ORDER — SODIUM CHLORIDE 0.9% IV SOLUTION: Freq: Once | INTRAVENOUS | Status: AC

## 2022-01-15 MED ORDER — CHLORHEXIDINE GLUCONATE CLOTH 2 % EX PADS
6.0000 | MEDICATED_PAD | Freq: Every day | CUTANEOUS | Status: DC
  Administered 2022-01-16 – 2022-01-17 (×2): 6 via TOPICAL

## 2022-01-15 MED ORDER — SODIUM CHLORIDE 0.9 % IV SOLN: INTRAVENOUS | Status: DC

## 2022-01-15 NOTE — H&P (Signed)
Alicia Coleman , MD [address] [place], [place], [place], [id number], [place], [place], [place], [address] Phone: [phone]  Fax: [phone]  Consultation  Referring Provider:    ER Primary Care Physician:  Pcp, No Primary Gastroenterologist:  Dr. Vicente Males         Reason for Consultation:     GI bleed  Date of Admission:  01/14/2022 Date of Consultation:  01/15/2022         HPI:   Alicia Coleman is a 66 y.o. female with a history of colon cancer with metastasis initially had a CT scan of the abdomen in 5//2022 for iron deficiency anemia and was found to have a large mass in the distal sigmoid colon extending to the rectosigmoid junction and upper rectum compatible with colon cancer the mass extended directly from the lesion into the pelvic sidewall suggesting tumor extension and invasion multiple abnormal lymph nodes were seen in the distal sigmoid colon and rectum concerning for metastatic disease.  Liver metastasis were noted.  The mass appears to directly invade the posterior uterus.  I performed a sigmoidoscopy which showed a large obstructing mass in the rectosigmoid and the scope could not traverse beyond obstruction patient had a diverting colostomy on 05/01/2021 with Dr. Dahlia Byes .  Has been on palliative chemotherapy.   She presented to the emergency room with a complaint of seeing blood in her colostomy bag as well as rectal bleeding.  She underwent a CT angiogram in the emergency room that showed large pelvic mass compatible with known colonic neoplasm with extension beyond the rectal lumen involvement of adjacent pelvic structures high density material in the lumen of the diabetic colon below the double barrel ostomy compatible with tumor related bleeding.  Signs of metastatic disease and local invasion noted.  When I spoke to the patient she states that she has not had any rectal bleeding but has noticed blood in the colostomy bag.  No abdominal pain.  Baseline  hemoglobin between 9.5 and 10.5 g.  This morning hemoglobin is 7.1 g.  MCV is 93.  On 12/17/2021 ferritin of 532.  B12 is in the borderline lower side of normal at 512. Past Medical History:  Diagnosis Date   Allergy    Anemia    Cancer [facility])     Past Surgical History:  Procedure Laterality Date   FLEXIBLE SIGMOIDOSCOPY N/A 04/24/2021   Procedure: FLEXIBLE SIGMOIDOSCOPY;  Surgeon: Alicia Bellows, MD;  Location: [facility] ENDOSCOPY;  Service: Gastroenterology;  Laterality: N/A;   IR CV LINE INJECTION  11/13/2021   IR IMAGING GUIDED PORT INSERTION  11/19/2021   IR REMOVAL TUN ACCESS W/ PORT W/O FL MOD SED  11/19/2021   PORTACATH PLACEMENT N/A 05/01/2021   Procedure: INSERTION PORT-A-CATH;  Surgeon: Jules Husbands, MD;  Location: [facility] ORS;  Service: General;  Laterality: N/A;    Prior to Admission medications   Medication Sig Start Date End Date Taking? Authorizing Provider  lidocaine-prilocaine (EMLA) cream Apply 1 application topically as needed. 01/14/22  Yes Sindy Guadeloupe, MD  acetaminophen (TYLENOL) 325 MG tablet Take 650 mg by mouth every 6 (six) hours as needed. Patient not taking: Reported on 01/14/2022    [provider]  folic acid (FOLVITE) 1 MG tablet Take 1 tablet (1 mg total) by mouth daily. Patient not taking: Reported on 11/12/2021 05/19/21   Sindy Guadeloupe, MD  oxycodone (OXY-IR) 5 MG capsule Take 5 mg by mouth every 4 (four) hours as  needed. Patient not taking: Reported on 11/12/2021    [provider]  potassium chloride SA (KLOR-CON M) 20 MEQ tablet Take 1 tablet (20 mEq total) by mouth daily. Patient not taking: Reported on 01/14/2022 12/03/21   Sindy Guadeloupe, MD    Family History  Problem Relation Age of Onset   Stroke Mother    Hypertension Mother    Cancer Father      Social History   Tobacco Use   Smoking status: Never   Smokeless tobacco: Never  Vaping Use   Vaping Use: Never used  Substance Use Topics   Alcohol use: Not Currently    Drug use: Never    Allergies as of 01/14/2022 - Review Complete 01/14/2022  Allergen Reaction Noted   Oxaliplatin Nausea And Vomiting 10/14/2021    Review of Systems:    All systems reviewed and negative except where noted in HPI.   Physical Exam:  Vital signs in last 24 hours: Temp:  [96.8 F (36 C)-98.2 F (36.8 C)] 97.8 F (36.6 C) (02/16 0734) Pulse Rate:  [68-102] 75 (02/16 0734) Resp:  [15-18] 15 (02/16 0734) BP: (91-134)/(63-112) 91/63 (02/16 0734) SpO2:  [99 %-100 %] 100 % (02/16 0734) Weight:  [56.2 kg-57.7 kg] 57.7 kg (02/16 0024) Last BM Date : 01/14/22 General:   Pleasant, cooperative in NAD Head:  Normocephalic and atraumatic. Eyes:   No icterus.   Conjunctiva pink. PERRLA. Ears:  Normal auditory acuity. Neck:  Supple; no masses or thyroidomegaly Lungs: Respirations even and unlabored. Lungs clear to auscultation bilaterally.   No wheezes, crackles, or rhonchi.  Heart:  Regular rate and rhythm;  Without murmur, clicks, rubs or gallops Abdomen:  Soft, nondistended, nontender. Normal bowel sounds. No appreciable masses or hepatomegaly.  No rebound or guarding.  Ostomy bag has dark red blood in Neurologic:  Alert and oriented x3;  grossly normal neurologically. Skin:  Intact without significant lesions or rashes. Cervical Nodes:  No significant cervical adenopathy. Psych:  Alert and cooperative. Normal affect.  LAB RESULTS: Recent Labs    01/14/22 0919 01/14/22 1958 01/15/22 0410  WBC 5.7 4.2  --   HGB 9.7* 9.2* 7.1*  HCT 30.5* 30.1* 22.8*  PLT 154 188  --    BMET Recent Labs    01/14/22 0919 01/14/22 1958  NA 134* 137  K 3.7 3.7  CL 103 107  CO2 27 24  GLUCOSE 103* 178*  BUN 10 14  CREATININE 0.45 0.73  CALCIUM 8.7* 9.0   LFT Recent Labs    01/14/22 1958  PROT 6.5  ALBUMIN 2.8*  AST 20  ALT 12  ALKPHOS 72  BILITOT 0.5   PT/INR Recent Labs    01/14/22 1958  LABPROT 14.4  INR 1.1    STUDIES: CT Angio Abd/Pel W and/or Wo  Contrast  Result Date: 01/14/2022 CLINICAL DATA:  A 66 year old female presents for evaluation of GI bleeding. EXAM: CTA ABDOMEN AND PELVIS WITHOUT AND WITH CONTRAST TECHNIQUE: Multidetector CT imaging of the abdomen and pelvis was performed using the standard protocol during bolus administration of intravenous contrast. Multiplanar reconstructed images and MIPs were obtained and reviewed to evaluate the vascular anatomy. RADIATION DOSE REDUCTION: This exam was performed according to the departmental dose-optimization program which includes automated exposure control, adjustment of the mA and/or kV according to patient size and/or use of iterative reconstruction technique. CONTRAST:  162mL OMNIPAQUE IOHEXOL 350 MG/ML SOLN COMPARISON:  Comparison is made with December 15, 2021. FINDINGS: VASCULAR Aorta: Normal caliber  aorta without aneurysm, dissection, vasculitis or significant stenosis. Celiac: Widely patent with normal caliber. No signs of dissection or aneurysm. SMA: Patent without evidence of aneurysm, dissection, vasculitis or significant stenosis. Renals: Single renal arteries which are patent without signs of acute process. IMA: Patent without evidence of aneurysm, dissection, vasculitis or significant stenosis. Inflow: Patent without evidence of aneurysm, dissection, vasculitis or significant stenosis. Proximal Outflow: Bilateral common femoral and visualized portions of the superficial and profunda femoral arteries are patent without evidence of aneurysm, dissection, vasculitis or significant stenosis. Veins: Smooth contours of the IVC. Normal caliber of the iliac veins. Review of the MIP images confirms the above findings. NON-VASCULAR Lower chest: The incidental imaging of the lung bases with basilar atelectasis, no effusion or sign of consolidation. Hepatobiliary: Signs of RIGHT hepatic mass (image 22/7) 5.8 x 5.4 as compared to 5.3 x 5.2 cm on the study from January 16th cephalad extension from the  mass appears similar. There are no new hepatic lesions. The portal vein is patent. No pericholecystic stranding. Pancreas: Unremarkable. No pancreatic ductal dilatation or surrounding inflammatory changes. Spleen: Unremarkable. Adrenals/Urinary Tract: Adrenal glands are normal. There is symmetric renal enhancement with a small LEFT renal cyst. No hydronephrosis. No perinephric stranding. No stranding adjacent to the urinary bladder. Stomach/Bowel: No acute small bowel process. Stomach is unremarkable. Small bowel is nondilated. The appendix is normal. Double barrel ostomy in the LEFT lower quadrant, diverting colostomy is again noted. Downstream from the diverting colostomy is a large colonic mass with extra colonic extension and central necrotic debris which is in communication with the colonic lumen. This shows no substantial change compared to imaging from January 16th and there are signs of extensive enhancing tumor that abuts the uterus and extends along the LEFT rectal wall, associated with LEFT pelvic sidewall nodularity near the sacro sciatic notch which is unchanged. No overt signs of active extravasation though this is likely the site of bleeding. A moderate amount of stool, formed stool remains present in the rectum without increase in moderate rectal distension that was seen on the previous exam. Nodular soft tissue changes in the sigmoid mesentery show no change compatible with tumor in this area. Dominant area of the mass measuring up to 6.3 x 4.1 cm as compared to 5.9 x 3.9 cm. There is higher density material within the upstream lumen of the colon which is mildly distended previously collapsed on the prior imaging study. Lymphatic: Normal caliber of the abdominal aorta as outlined above. Signs of mesenteric adenopathy and LEFT pelvic sidewall adenopathy the not changed since previous imaging. Nodule or lymph node at the sacro sciatic notch measuring 14 mm is stable over the short interval.  Reproductive: Uterus is inseparable from the pelvic mass that arises from the sigmoid colon. Other: No free air or signs of ascites. Musculoskeletal: Osteopenia. No acute musculoskeletal process or destructive bone finding. IMPRESSION: VASCULAR No acute vascular process such as discrete site of extravasation identified on the current study. NON-VASCULAR Prominent vessels about a large pelvic mass compatible with known colonic neoplasm with extension beyond the rectal lumen, involvement of adjacent pelvic structures now with upstream dilation and high-density material in the lumen of the diverted colon below the double barrel ostomy. Findings are compatible with tumor related bleeding. Signs of metastatic disease and local invasion. Enlarging colonic neoplasm and increasing size in the short interval of the RIGHT hepatic lobe mass. Electronically Signed   By: Zetta Bills M.D.   On: 01/14/2022 21:27  Impression / Plan:   Xuan Mateus is a 66 y.o. y/o female with a history of metastatic colon cancer diagnosed in early 2022.  On palliative chemotherapy and has had a diverting colostomy.  She came to the emergency room with complaints of blood in the colostomy bag as well as rectal bleeding.  She has a mass in the rectum completely obstructing the lumen which could not be traversed.  CT angiogram showed features suggestive of bleeding from the tumor.  The ostomy bag still has blood in it.  Discussed with the patient that we can perform a colonoscopy and evaluate the remaining colon and if there is a tumor that is bleeding or oozing we can apply Hemospray on it.  I will plan procedure for tomorrow  I have discussed alternative options, risks & benefits,  which include, but are not limited to, bleeding, infection, perforation,respiratory complication & drug reaction.  The patient agrees with this plan & written consent will be obtained.   '  Thank you for involving me in the care of this patient.       LOS: 0 days   Alicia Bellows, MD  01/15/2022, 8:41 AM

## 2022-01-15 NOTE — Plan of Care (Signed)
°  Problem: Education: Goal: Knowledge of General Education information will improve Description: Including pain rating scale, medication(s)/side effects and non-pharmacologic comfort measures Outcome: Progressing   Problem: Clinical Measurements: Goal: Respiratory complications will improve Outcome: Progressing   Problem: Activity: Goal: Risk for activity intolerance will decrease Outcome: Progressing   Problem: Pain Managment: Goal: General experience of comfort will improve Outcome: Progressing   Problem: Safety: Goal: Ability to remain free from injury will improve Outcome: Progressing   

## 2022-01-15 NOTE — Progress Notes (Addendum)
Pt hgb this morning was at 7.1 from 9.2 yesterday. BP at 100/75 HR 73. Pt on IV protonix and IV fluids continously. Pt asymptomatic. NP Randol Kern made aware. Will continue to monitor.  Update 0540: NP Randol Kern placed order. Blood bank made aware of order. Will continue to monitor.  Update 0635: Blood transfusion started. Pt tolerating well. Will continue to monitor.

## 2022-01-15 NOTE — Progress Notes (Addendum)
Pt was admitted on the floor with no signs of distress. Pt alert x 4. VSS. Pt was educated about safety and ascom within pt reach. Will continue to monitor.

## 2022-01-15 NOTE — Progress Notes (Signed)
Per Oncologist, pt started with 5FU pump, 4100mg , on 2/15 at 1400hrs. Pump will be removed by cancer center nursing staff on 2/17 at 1100hrs. Please continue pump while patient is admitted until pump d/c on 2/17.

## 2022-01-15 NOTE — Progress Notes (Signed)
Cross Cover 2 gm drop in HGB. 1 unit PRBC transfusion ordered and placed patient on PPI for SUP

## 2022-01-15 NOTE — Progress Notes (Signed)
PROGRESS NOTE  Alicia Coleman    DOB: 12/31/1955, 66 y.o.  GGE:[id number]    Code Status: Full Code   DOA: 01/14/2022   LOS: 0   Brief hospital course  Alicia Coleman is a 66 y.o. female with a PMH significant for metastatic colon cancer diagnosed 04/2021 with colostomy and palliative chemotherapy complicated by an allergic reaction.  Recently restarted on chemotherapy and evaluated most recently by oncology on 12/31/2021. They presented from home to the ED on 01/14/2022 with bleeding rectally and in her colostomy bag x 1 days.  She denies symptoms of anemia. In the ED, it was found that they had mild tachycardia but otherwise hemodynamically stable with hemoglobin of 9.2 which is close to baseline.  CT scan was consistent with her metastatic disease causing bleeding. They were treated with IV Protonix.  GI was consulted for further evaluation. Patient was admitted to medicine service for further workup and management of GI bleeding as outlined in detail below.  01/15/22 -stable  Assessment & Plan  Principal Problem:   Hematochezia Active Problems:   Iron deficiency anemia   Metastatic colon cancer in female [facility])   Colostomy status ([place])  GI bleed-related to known malignancy.  Hemoglobin 9.7 on presentation which gradually decreased 9.7>9.2>7.1 and required 1 unit PRBCs.  Patient states that she has continued bleeding in colostomy bag but no more per rectum.  Hemoglobin 8.4 s/p transfusion. -GI following, appreciate recommendations -CBC a.m.  Anemia-multifactorial.  Acute blood loss, chemotherapy side effect, iron deficiency -Curbside to her heme-onc for further recommendations  Metastatic colon cancer.  CTA abdomen and pelvis showed enlarging colonic neoplasm and increasing size in the short interval of the right hepatic lobe mass -Continue palliative chemotherapy which, per patient is due to finish 2/17  Body mass index is 20.53 kg/m.  VTE ppx: SCDs Start: 01/14/22 2325  Diet:      Diet   Diet NPO time specified   Subjective 01/15/22    Pt reports feeling overall well. She has not had any more rectal bleeding but continues to have significant bleeding in her colostomy bag. She is hungry and would like to eat.    Objective   Vitals:   01/15/22 0502 01/15/22 0505 01/15/22 0624 01/15/22 0650  BP: 100/75 92/65 100/68 98/71  Pulse: 74 68 70 70  Resp: 18  18 18   Temp: 98.2 F (36.8 C)  97.9 F (36.6 C) 98 F (36.7 C)  TempSrc: Oral  Oral Oral  SpO2: 100%  100% 100%  Weight:      Height:        Intake/Output Summary (Last 24 hours) at 01/15/2022 0722 Last data filed at 01/15/2022 4650 Gross per 24 hour  Intake 799.83 ml  Output 800 ml  Net -0.17 ml   Filed Weights   01/14/22 1954 01/15/22 0024  Weight: 56.2 kg 57.7 kg     Physical Exam:  General: awake, alert, NAD HEENT: atraumatic, clear conjunctiva, anicteric sclera, MMM, hearing grossly normal Respiratory: normal respiratory effort. Cardiovascular: normal S1/S2, RRR, no JVD, murmurs, quick capillary refill  Gastrointestinal: soft, NT, ND. Positive for wine colored discharge in colostomy bag consistent with bloody stool Nervous: A&O x3. no gross focal neurologic deficits, normal speech Extremities: moves all equally, no edema, low tone Skin: dry, intact, normal temperature, normal color. No rashes, lesions or ulcers on exposed skin Psychiatry: normal mood, congruent affect  Labs   I have personally reviewed the following labs and imaging studies CBC  Component Value Date/Time   WBC 4.2 01/14/2022 1958   RBC 3.23 (L) 01/14/2022 1958   HGB 7.1 (L) 01/15/2022 0410   HCT 22.8 (L) 01/15/2022 0410   PLT 188 01/14/2022 1958   MCV 93.2 01/14/2022 1958   MCH 28.5 01/14/2022 1958   MCHC 30.6 01/14/2022 1958   RDW 13.7 01/14/2022 1958   LYMPHSABS 0.3 (L) 01/14/2022 1958   MONOABS 0.1 01/14/2022 1958   EOSABS 0.0 01/14/2022 1958   BASOSABS 0.0 01/14/2022 1958   BMP Latest Ref Rng & Units  01/14/2022 01/14/2022 12/31/2021  Glucose 70 - 99 mg/dL 178(H) 103(H) 110(H)  BUN 8 - 23 mg/dL 14 10 9   Creatinine 0.44 - 1.00 mg/dL 0.73 0.45 0.51  Sodium 135 - 145 mmol/L 137 134(L) 135  Potassium 3.5 - 5.1 mmol/L 3.7 3.7 3.5  Chloride 98 - 111 mmol/L 107 103 103  CO2 22 - 32 mmol/L 24 27 24   Calcium 8.9 - 10.3 mg/dL 9.0 8.7(L) 9.0    CT Angio Abd/Pel W and/or Wo Contrast  Result Date: 01/14/2022 CLINICAL DATA:  A 66 year old female presents for evaluation of GI bleeding. EXAM: CTA ABDOMEN AND PELVIS WITHOUT AND WITH CONTRAST TECHNIQUE: Multidetector CT imaging of the abdomen and pelvis was performed using the standard protocol during bolus administration of intravenous contrast. Multiplanar reconstructed images and MIPs were obtained and reviewed to evaluate the vascular anatomy. RADIATION DOSE REDUCTION: This exam was performed according to the departmental dose-optimization program which includes automated exposure control, adjustment of the mA and/or kV according to patient size and/or use of iterative reconstruction technique. CONTRAST:  115mL OMNIPAQUE IOHEXOL 350 MG/ML SOLN COMPARISON:  Comparison is made with December 15, 2021. FINDINGS: VASCULAR Aorta: Normal caliber aorta without aneurysm, dissection, vasculitis or significant stenosis. Celiac: Widely patent with normal caliber. No signs of dissection or aneurysm. SMA: Patent without evidence of aneurysm, dissection, vasculitis or significant stenosis. Renals: Single renal arteries which are patent without signs of acute process. IMA: Patent without evidence of aneurysm, dissection, vasculitis or significant stenosis. Inflow: Patent without evidence of aneurysm, dissection, vasculitis or significant stenosis. Proximal Outflow: Bilateral common femoral and visualized portions of the superficial and profunda femoral arteries are patent without evidence of aneurysm, dissection, vasculitis or significant stenosis. Veins: Smooth contours of the IVC.  Normal caliber of the iliac veins. Review of the MIP images confirms the above findings. NON-VASCULAR Lower chest: The incidental imaging of the lung bases with basilar atelectasis, no effusion or sign of consolidation. Hepatobiliary: Signs of RIGHT hepatic mass (image 22/7) 5.8 x 5.4 as compared to 5.3 x 5.2 cm on the study from January 16th cephalad extension from the mass appears similar. There are no new hepatic lesions. The portal vein is patent. No pericholecystic stranding. Pancreas: Unremarkable. No pancreatic ductal dilatation or surrounding inflammatory changes. Spleen: Unremarkable. Adrenals/Urinary Tract: Adrenal glands are normal. There is symmetric renal enhancement with a small LEFT renal cyst. No hydronephrosis. No perinephric stranding. No stranding adjacent to the urinary bladder. Stomach/Bowel: No acute small bowel process. Stomach is unremarkable. Small bowel is nondilated. The appendix is normal. Double barrel ostomy in the LEFT lower quadrant, diverting colostomy is again noted. Downstream from the diverting colostomy is a large colonic mass with extra colonic extension and central necrotic debris which is in communication with the colonic lumen. This shows no substantial change compared to imaging from January 16th and there are signs of extensive enhancing tumor that abuts the uterus and extends along the LEFT rectal wall, associated  with LEFT pelvic sidewall nodularity near the sacro sciatic notch which is unchanged. No overt signs of active extravasation though this is likely the site of bleeding. A moderate amount of stool, formed stool remains present in the rectum without increase in moderate rectal distension that was seen on the previous exam. Nodular soft tissue changes in the sigmoid mesentery show no change compatible with tumor in this area. Dominant area of the mass measuring up to 6.3 x 4.1 cm as compared to 5.9 x 3.9 cm. There is higher density material within the upstream lumen  of the colon which is mildly distended previously collapsed on the prior imaging study. Lymphatic: Normal caliber of the abdominal aorta as outlined above. Signs of mesenteric adenopathy and LEFT pelvic sidewall adenopathy the not changed since previous imaging. Nodule or lymph node at the sacro sciatic notch measuring 14 mm is stable over the short interval. Reproductive: Uterus is inseparable from the pelvic mass that arises from the sigmoid colon. Other: No free air or signs of ascites. Musculoskeletal: Osteopenia. No acute musculoskeletal process or destructive bone finding. IMPRESSION: VASCULAR No acute vascular process such as discrete site of extravasation identified on the current study. NON-VASCULAR Prominent vessels about a large pelvic mass compatible with known colonic neoplasm with extension beyond the rectal lumen, involvement of adjacent pelvic structures now with upstream dilation and high-density material in the lumen of the diverted colon below the double barrel ostomy. Findings are compatible with tumor related bleeding. Signs of metastatic disease and local invasion. Enlarging colonic neoplasm and increasing size in the short interval of the RIGHT hepatic lobe mass. Electronically Signed   By: Zetta Bills M.D.   On: 01/14/2022 21:27    Disposition Plan & Communication  Patient status: Observation  Admitted From: Home Planned disposition location: Home Anticipated discharge date: 2/18 pending GI and heme/onc evaluation and/or interventions  Family Communication: none    Author: Richarda Osmond, DO [facility] 01/15/2022, 7:22 AM   Available by Epic secure chat 7AM-7PM. If 7PM-7AM, please contact night-coverage.  [facility] contact information found on [email or web address].

## 2022-01-16 ENCOUNTER — Encounter: Payer: Self-pay | Admitting: Student in an Organized Health Care Education/Training Program

## 2022-01-16 ENCOUNTER — Inpatient Hospital Stay: Payer: BC Managed Care – PPO

## 2022-01-16 ENCOUNTER — Encounter
Admission: EM | Disposition: A | Discharge status: Home / Self Care | Payer: Self-pay | Source: Home / Self Care | Attending: Student in an Organized Health Care Education/Training Program

## 2022-01-16 ENCOUNTER — Inpatient Hospital Stay: Payer: BC Managed Care – PPO | Admitting: Anesthesiology

## 2022-01-16 DIAGNOSIS — C189 Malignant neoplasm of colon, unspecified: Secondary | ICD-10-CM

## 2022-01-16 DIAGNOSIS — Z5112 Encounter for antineoplastic immunotherapy: Secondary | ICD-10-CM | POA: Diagnosis not present

## 2022-01-16 DIAGNOSIS — K921 Melena: Secondary | ICD-10-CM | POA: Diagnosis not present

## 2022-01-16 DIAGNOSIS — K922 Gastrointestinal hemorrhage, unspecified: Secondary | ICD-10-CM | POA: Diagnosis not present

## 2022-01-16 HISTORY — PX: ESOPHAGOGASTRODUODENOSCOPY: SHX5428

## 2022-01-16 HISTORY — PX: COLONOSCOPY WITH PROPOFOL: SHX5780

## 2022-01-16 LAB — CBC
HCT: 26.7 % — ABNORMAL LOW (ref 36.0–46.0)
Hemoglobin: 8.6 g/dL — ABNORMAL LOW (ref 12.0–15.0)
MCH: 28.9 pg (ref 26.0–34.0)
MCHC: 32.2 g/dL (ref 30.0–36.0)
MCV: 89.6 fL (ref 80.0–100.0)
Platelets: 121 10*3/uL — ABNORMAL LOW (ref 150–400)
RBC: 2.98 MIL/uL — ABNORMAL LOW (ref 3.87–5.11)
RDW: 13.9 % (ref 11.5–15.5)
WBC: 4.5 10*3/uL (ref 4.0–10.5)
nRBC: 0 % (ref 0.0–0.2)

## 2022-01-16 LAB — BPAM RBC
Blood Product Expiration Date: 202303212359
ISSUE DATE / TIME: 202302160629
Unit Type and Rh: 5100

## 2022-01-16 LAB — TYPE AND SCREEN
ABO/RH(D): O POS
Antibody Screen: NEGATIVE
Unit division: 0

## 2022-01-16 SURGERY — COLONOSCOPY WITH PROPOFOL
Anesthesia: General

## 2022-01-16 MED ORDER — PHENYLEPHRINE HCL (PRESSORS) 10 MG/ML IV SOLN
INTRAVENOUS | Status: DC | PRN
  Administered 2022-01-16 (×2): 80 ug via INTRAVENOUS

## 2022-01-16 MED ORDER — PROPOFOL 500 MG/50ML IV EMUL
INTRAVENOUS | Status: AC
Start: 2022-01-16 — End: ?
  Filled 2022-01-16: qty 50

## 2022-01-16 MED ORDER — EPHEDRINE 5 MG/ML INJ
INTRAVENOUS | Status: AC
  Filled 2022-01-16: qty 5

## 2022-01-16 MED ORDER — DEXMEDETOMIDINE HCL IN NACL 200 MCG/50ML IV SOLN
INTRAVENOUS | Status: DC | PRN
  Administered 2022-01-16: 8 ug via INTRAVENOUS

## 2022-01-16 MED ORDER — PROPOFOL 10 MG/ML IV BOLUS
INTRAVENOUS | Status: DC | PRN
  Administered 2022-01-16: 80 mg via INTRAVENOUS

## 2022-01-16 MED ORDER — SODIUM CHLORIDE 0.9% FLUSH
10.0000 mL | INTRAVENOUS | Status: DC | PRN
  Administered 2022-01-16: 10 mL
  Filled 2022-01-16: qty 10

## 2022-01-16 MED ORDER — PROPOFOL 500 MG/50ML IV EMUL
INTRAVENOUS | Status: DC | PRN
  Administered 2022-01-16: 150 ug/kg/min via INTRAVENOUS

## 2022-01-16 MED ORDER — LIDOCAINE HCL (CARDIAC) PF 100 MG/5ML IV SOSY
PREFILLED_SYRINGE | INTRAVENOUS | Status: DC | PRN
  Administered 2022-01-16: 40 mg via INTRAVENOUS

## 2022-01-16 MED ORDER — SODIUM CHLORIDE 0.9 % IV SOLN
200.0000 mg | Freq: Every day | INTRAVENOUS | Status: DC
  Administered 2022-01-17: 08:00:00 200 mg via INTRAVENOUS
  Filled 2022-01-16: qty 200

## 2022-01-16 NOTE — Anesthesia Preprocedure Evaluation (Signed)
Anesthesia Evaluation  Patient identified by MRN, date of birth, ID band Patient awake    Reviewed: Allergy & Precautions, NPO status , Patient's Chart, lab work & pertinent test results  History of Anesthesia Complications Negative for: history of anesthetic complications  Airway Mallampati: II  TM Distance: >3 FB Neck ROM: Full    Dental no notable dental hx. (+) Teeth Intact   Pulmonary neg pulmonary ROS, neg sleep apnea, neg COPD, Patient abstained from smoking.Not current smoker,    Pulmonary exam normal breath sounds clear to auscultation       Cardiovascular Exercise Tolerance: Good METS(-) hypertension(-) CAD and (-) Past MI negative cardio ROS  (-) dysrhythmias  Rhythm:Regular Rate:Normal - Systolic murmurs    Neuro/Psych negative neurological ROS  negative psych ROS   GI/Hepatic neg GERD  ,(+)     (-) substance abuse  ,   Endo/Other  neg diabetes  Renal/GU negative Renal ROS     Musculoskeletal   Abdominal   Peds  Hematology  (+) Blood dyscrasia, anemia ,   Anesthesia Other Findings Past Medical History: No date: Allergy No date: Anemia No date: Cancer [facility])  Reproductive/Obstetrics                             Anesthesia Physical Anesthesia Plan  ASA: 3  Anesthesia Plan: General   Post-op Pain Management: Minimal or no pain anticipated   Induction: Intravenous  PONV Risk Score and Plan: 3 and Propofol infusion, TIVA and Ondansetron  Airway Management Planned: Nasal Cannula  Additional Equipment: None  Intra-op Plan:   Post-operative Plan:   Informed Consent: I have reviewed the patients History and Physical, chart, labs and discussed the procedure including the risks, benefits and alternatives for the proposed anesthesia with the patient or authorized representative who has indicated his/her understanding and acceptance.     Dental advisory given  Plan  Discussed with: CRNA and Surgeon  Anesthesia Plan Comments: (Discussed risks of anesthesia with patient, including possibility of difficulty with spontaneous ventilation under anesthesia necessitating airway intervention, PONV, and rare risks such as cardiac or respiratory or neurological events, and allergic reactions. Discussed the role of CRNA in patient's perioperative care. Patient understands.)        Anesthesia Quick Evaluation

## 2022-01-16 NOTE — Anesthesia Postprocedure Evaluation (Signed)
Anesthesia Post Note  Patient: Alicia Coleman  Procedure(s) Performed: COLONOSCOPY WITH PROPOFOL ESOPHAGOGASTRODUODENOSCOPY (EGD)  Patient location during evaluation: Endoscopy Anesthesia Type: General Level of consciousness: awake and alert Pain management: pain level controlled Vital Signs Assessment: post-procedure vital signs reviewed and stable Respiratory status: spontaneous breathing, nonlabored ventilation, respiratory function stable and patient connected to nasal cannula oxygen Cardiovascular status: blood pressure returned to baseline and stable Postop Assessment: no apparent nausea or vomiting Anesthetic complications: no   No notable events documented.   Last Vitals:  Vitals:   01/16/22 1230 01/16/22 1240  BP: 115/75 113/81  Pulse: 73 73  Resp: 20 18  Temp:    SpO2: 100% 100%    Last Pain:  Vitals:   01/16/22 1240  TempSrc:   PainSc: 0-No pain                 Arita Miss

## 2022-01-16 NOTE — H&P (Signed)
[name], MD [address] [place], [place], [place], [address] [address], [place], [place], [place], [address] Phone: [phone]  Fax: [phone]  Primary Care Physician:  Pcp, No   Pre-Procedure History & Physical: HPI:  Alicia Coleman is a 66 y.o. female is here for an endoscopy and colonoscopy    Past Medical History:  Diagnosis Date   Allergy    Anemia    Cancer [facility])     Past Surgical History:  Procedure Laterality Date   FLEXIBLE SIGMOIDOSCOPY N/A 04/24/2021   Procedure: FLEXIBLE SIGMOIDOSCOPY;  Surgeon: [name], MD;  Location: [facility] ENDOSCOPY;  Service: Gastroenterology;  Laterality: N/A;   IR CV LINE INJECTION  11/13/2021   IR IMAGING GUIDED PORT INSERTION  11/19/2021   IR REMOVAL TUN ACCESS W/ PORT W/O FL MOD SED  11/19/2021   PORTACATH PLACEMENT N/A 05/01/2021   Procedure: INSERTION PORT-A-CATH;  Surgeon: Jules Husbands, MD;  Location: [facility] ORS;  Service: General;  Laterality: N/A;    Prior to Admission medications   Medication Sig Start Date End Date Taking? Authorizing Provider  lidocaine-prilocaine (EMLA) cream Apply 1 application topically as needed. 01/14/22  Yes Sindy Guadeloupe, MD  acetaminophen (TYLENOL) 325 MG tablet Take 650 mg by mouth every 6 (six) hours as needed. Patient not taking: Reported on 01/14/2022    [provider]  folic acid (FOLVITE) 1 MG tablet Take 1 tablet (1 mg total) by mouth daily. Patient not taking: Reported on 11/12/2021 05/19/21   Sindy Guadeloupe, MD  oxycodone (OXY-IR) 5 MG capsule Take 5 mg by mouth every 4 (four) hours as needed. Patient not taking: Reported on 11/12/2021    [provider]  potassium chloride SA (KLOR-CON M) 20 MEQ tablet Take 1 tablet (20 mEq total) by mouth daily. Patient not taking: Reported on 01/14/2022 12/03/21   Sindy Guadeloupe, MD    Allergies as of 01/14/2022 - Review Complete 01/14/2022  Allergen Reaction Noted   Oxaliplatin Nausea And Vomiting 10/14/2021    Family  History  Problem Relation Age of Onset   Stroke Mother    Hypertension Mother    Cancer Father     Social History   Socioeconomic History   Marital status: Married    Spouse name: Not on file   Number of children: Not on file   Years of education: Not on file   Highest education level: Not on file  Occupational History   Not on file  Tobacco Use   Smoking status: Never   Smokeless tobacco: Never  Vaping Use   Vaping Use: Never used  Substance and Sexual Activity   Alcohol use: Not Currently   Drug use: Never   Sexual activity: Not Currently  Other Topics Concern   Not on file  Social History Narrative   Not on file   Social Determinants of Health   Financial Resource Strain: Not on file  Food Insecurity: Not on file  Transportation Needs: Not on file  Physical Activity: Not on file  Stress: Not on file  Social Connections: Not on file  Intimate Partner Violence: Not on file    Review of Systems: See HPI, otherwise negative ROS  Physical Exam: BP 112/72 (BP Location: Right Arm)    Pulse 83    Temp 98 F (36.7 C) (Oral)    Resp 18    Ht 5\' 6"  (1.676 m)    Wt 57.7 kg    LMP  (LMP  Unknown)    SpO2 100%    BMI 20.53 kg/m  General:   Alert,  pleasant and cooperative in NAD Head:  Normocephalic and atraumatic. Neck:  Supple; no masses or thyromegaly. Lungs:  Clear throughout to auscultation, normal respiratory effort.    Heart:  +S1, +S2, Regular rate and rhythm, No edema. Abdomen:  Soft, nontender and nondistended. Normal bowel sounds, without guarding, and without rebound.  Ostomy bag noted Neurologic:  Alert and  oriented x4;  grossly normal neurologically.  Impression/Plan: Alicia Coleman is here for an endoscopy and colonoscopy  to be performed for  evaluation of melena     Risks, benefits, limitations, and alternatives regarding endoscopy have been reviewed with the patient.  Questions have been answered.  All parties agreeable.   [name], MD   01/16/2022, 11:06 AM

## 2022-01-16 NOTE — Progress Notes (Signed)
Returned from Endoscopy in stable condition.

## 2022-01-16 NOTE — Op Note (Signed)
[facility] Gastroenterology Patient Name: Alicia Coleman Procedure Date: 01/16/2022 11:34 AM MRN: [id number] Account #: [id number] Date of Birth: 1956/02/18 Admit Type: Inpatient Age: 66 Room: [facility] ENDO ROOM 4 Gender: Female Note Status: Finalized Instrument Name: Upper Endoscope [id number] Procedure:             Colonoscopy Indications:           Melena Providers:             [name] MD, MD Referring MD:          No Local Md, MD (Referring MD) Medicines:             Monitored Anesthesia Care Complications:         No immediate complications. Procedure:             Pre-Anesthesia Assessment:                        - Prior to the procedure, a History and Physical was                         performed, and patient medications, allergies and                         sensitivities were reviewed. The patient's tolerance                         of previous anesthesia was reviewed.                        - The risks and benefits of the procedure and the                         sedation options and risks were discussed with the                         patient. All questions were answered and informed                         consent was obtained.                        - ASA Grade Assessment: III - A patient with severe                         systemic disease.                        After obtaining informed consent, the colonoscope was                         passed under direct vision. Throughout the procedure,                         the patient's blood pressure, pulse, and oxygen                         saturations were monitored continuously. The Endoscope                         was introduced through the  descending colostomy with                         the intention of advancing to the cecum. The scope was                         advanced to the ascending colon before the procedure                         was aborted. Medications were given. The colonoscopy                          was performed with ease. The patient tolerated the                         procedure well. The quality of the bowel preparation                         was unsatisfactory. Findings:      a large amount of extensive amounts of semi-liquid stool was found in       the descending colon, in the transverse colon and in the ascending colon       it was green in color and no blood was seen Impression:            - Preparation of the colon was unsatisfactory.                        - Stool in the descending colon, in the transverse                         colon and in the ascending colon.                        - No specimens collected. Recommendation:        - Return patient to hospital ward for ongoing care.                        - Advance diet as tolerated.                        - Continue present medications.                        - Unlikely she is bleeding from the colon , lots of                         brown and green stool seen , no red or black stool                         indicating no bleed. Likely bleed from peristomal                         area. If there is further concern for bleeding please                         examine the stoma area for local bleeding . Procedure Code(s):     --- Professional ---  44388, 53, Colonoscopy through stoma; diagnostic,                         including collection of specimen(s) by brushing or                         washing, when performed (separate procedure) Diagnosis Code(s):     --- Professional ---                        K92.1, Melena (includes Hematochezia) CPT copyright 2019 American Medical Association. All rights reserved. The codes documented in this report are preliminary and upon coder review may  be revised to meet current compliance requirements. [name], MD [name] MD, MD 01/16/2022 12:09:58 PM This report has been signed electronically. Number of Addenda: 0 Note Initiated On:  01/16/2022 11:34 AM Scope Withdrawal Time: 0 hours 2 minutes 28 seconds  Total Procedure Duration: 0 hours 13 minutes 24 seconds  Estimated Blood Loss:  Estimated blood loss: none.      [facility]

## 2022-01-16 NOTE — Progress Notes (Signed)
To Endo in stable condition.

## 2022-01-16 NOTE — Progress Notes (Signed)
5FU chemo pump being removed by cancer center staff.

## 2022-01-16 NOTE — Progress Notes (Signed)
PROGRESS NOTE  Alicia Coleman    DOB: [birth year], 66 y.o.  NTI:[id number]    Code Status: Full Code   DOA: 01/14/2022   LOS: 1   Brief hospital course  Alicia Coleman is a 66 y.o. female with a PMH significant for metastatic colon cancer diagnosed 04/2021 with colostomy and palliative chemotherapy complicated by an allergic reaction.  Recently restarted on chemotherapy and evaluated most recently by oncology on 12/31/2021. They presented from home to the ED on 01/14/2022 with bleeding rectally and in her colostomy bag x 1 days.  She denies symptoms of anemia. In the ED, it was found that they had mild tachycardia but otherwise hemodynamically stable with hemoglobin of 9.2 which is close to baseline.  CT scan was consistent with her metastatic disease causing bleeding. They were treated with IV Protonix.  GI was consulted for further evaluation. Patient was admitted to medicine service for further workup and management of GI bleeding as outlined in detail below.  01/16/22 -stable  Assessment & Plan  Principal Problem:   Hematochezia Active Problems:   Iron deficiency anemia   Metastatic colon cancer in female [facility])   Colostomy status ([place])   Gastrointestinal hemorrhage   Acute blood loss anemia   GI bleed  GI bleed-related to known malignancy.  Hemoglobin 9.7 on presentation which gradually decreased 9.7>9.2>7.1 and required 1 unit PRBCs.  Patient states that bleeding into colostomy bag has diminished. Hemoglobin 8.6 s/p transfusion. -GI following, appreciate recommendations -CBC a.m. - colonoscopy planned today  Anemia-multifactorial.  Acute blood loss, chemotherapy side effect, iron deficiency -heme/onc following, appreciate recs  Metastatic colon cancer.  CTA abdomen and pelvis showed enlarging colonic neoplasm and increasing size in the short interval of the right hepatic lobe mass -Continue palliative chemotherapy  - pump to be discontinued today  Body mass index is 20.53  kg/m.  VTE ppx: SCDs Start: 01/14/22 2325  Diet:     Diet   Diet NPO time specified   Subjective 01/16/22    Pt reports feeling some mild abdominal pain but is able to rest. She denies further bleeding. No other complaints at this time.     Objective   Vitals:   01/15/22 0942 01/15/22 1701 01/15/22 2015 01/16/22 0123  BP: 106/72 94/61 94/67  111/75  Pulse: 75 82 89 74  Resp: 16 17 17 18   Temp: 97.9 F (36.6 C) 98 F (36.7 C) (!) 97.4 F (36.3 C) 97.9 F (36.6 C)  TempSrc: Oral  Oral Oral  SpO2: 100% 100% (!) 87% 100%  Weight:      Height:        Intake/Output Summary (Last 24 hours) at 01/16/2022 0706 Last data filed at 01/16/2022 8676 Gross per 24 hour  Intake 1206.92 ml  Output 500 ml  Net 706.92 ml    Filed Weights   01/14/22 1954 01/15/22 0024  Weight: 56.2 kg 57.7 kg     Physical Exam:  General: awake, alert, NAD HEENT: atraumatic, clear conjunctiva, anicteric sclera, MMM, hearing grossly normal Respiratory: normal respiratory effort. Cardiovascular: normal S1/S2, RRR, no JVD, murmurs, quick capillary refill  Gastrointestinal: soft, NT, ND. Colostomy bag intact with liquid stool Nervous: A&O x3. no gross focal neurologic deficits, normal speech Extremities: moves all equally, no edema, low tone Skin: dry, intact, normal temperature, normal color. No rashes, lesions or ulcers on exposed skin Psychiatry: normal mood, congruent affect  Labs   I have personally reviewed the following labs and imaging studies CBC    Component  Value Date/Time   WBC 4.5 01/16/2022 0428   RBC 2.98 (L) 01/16/2022 0428   HGB 8.6 (L) 01/16/2022 0428   HCT 26.7 (L) 01/16/2022 0428   PLT 121 (L) 01/16/2022 0428   MCV 89.6 01/16/2022 0428   MCH 28.9 01/16/2022 0428   MCHC 32.2 01/16/2022 0428   RDW 13.9 01/16/2022 0428   LYMPHSABS 0.3 (L) 01/14/2022 1958   MONOABS 0.1 01/14/2022 1958   EOSABS 0.0 01/14/2022 1958   BASOSABS 0.0 01/14/2022 1958   BMP Latest Ref Rng & Units  01/14/2022 01/14/2022 12/31/2021  Glucose 70 - 99 mg/dL 178(H) 103(H) 110(H)  BUN 8 - 23 mg/dL 14 10 9   Creatinine 0.44 - 1.00 mg/dL 0.73 0.45 0.51  Sodium 135 - 145 mmol/L 137 134(L) 135  Potassium 3.5 - 5.1 mmol/L 3.7 3.7 3.5  Chloride 98 - 111 mmol/L 107 103 103  CO2 22 - 32 mmol/L 24 27 24   Calcium 8.9 - 10.3 mg/dL 9.0 8.7(L) 9.0    CT Angio Abd/Pel W and/or Wo Contrast  Result Date: 01/14/2022 CLINICAL DATA:  A 66 year old female presents for evaluation of GI bleeding. EXAM: CTA ABDOMEN AND PELVIS WITHOUT AND WITH CONTRAST TECHNIQUE: Multidetector CT imaging of the abdomen and pelvis was performed using the standard protocol during bolus administration of intravenous contrast. Multiplanar reconstructed images and MIPs were obtained and reviewed to evaluate the vascular anatomy. RADIATION DOSE REDUCTION: This exam was performed according to the departmental dose-optimization program which includes automated exposure control, adjustment of the mA and/or kV according to patient size and/or use of iterative reconstruction technique. CONTRAST:  180mL OMNIPAQUE IOHEXOL 350 MG/ML SOLN COMPARISON:  Comparison is made with December 15, 2021. FINDINGS: VASCULAR Aorta: Normal caliber aorta without aneurysm, dissection, vasculitis or significant stenosis. Celiac: Widely patent with normal caliber. No signs of dissection or aneurysm. SMA: Patent without evidence of aneurysm, dissection, vasculitis or significant stenosis. Renals: Single renal arteries which are patent without signs of acute process. IMA: Patent without evidence of aneurysm, dissection, vasculitis or significant stenosis. Inflow: Patent without evidence of aneurysm, dissection, vasculitis or significant stenosis. Proximal Outflow: Bilateral common femoral and visualized portions of the superficial and profunda femoral arteries are patent without evidence of aneurysm, dissection, vasculitis or significant stenosis. Veins: Smooth contours of the IVC.  Normal caliber of the iliac veins. Review of the MIP images confirms the above findings. NON-VASCULAR Lower chest: The incidental imaging of the lung bases with basilar atelectasis, no effusion or sign of consolidation. Hepatobiliary: Signs of RIGHT hepatic mass (image 22/7) 5.8 x 5.4 as compared to 5.3 x 5.2 cm on the study from January 16th cephalad extension from the mass appears similar. There are no new hepatic lesions. The portal vein is patent. No pericholecystic stranding. Pancreas: Unremarkable. No pancreatic ductal dilatation or surrounding inflammatory changes. Spleen: Unremarkable. Adrenals/Urinary Tract: Adrenal glands are normal. There is symmetric renal enhancement with a small LEFT renal cyst. No hydronephrosis. No perinephric stranding. No stranding adjacent to the urinary bladder. Stomach/Bowel: No acute small bowel process. Stomach is unremarkable. Small bowel is nondilated. The appendix is normal. Double barrel ostomy in the LEFT lower quadrant, diverting colostomy is again noted. Downstream from the diverting colostomy is a large colonic mass with extra colonic extension and central necrotic debris which is in communication with the colonic lumen. This shows no substantial change compared to imaging from January 16th and there are signs of extensive enhancing tumor that abuts the uterus and extends along the LEFT rectal wall, associated  with LEFT pelvic sidewall nodularity near the sacro sciatic notch which is unchanged. No overt signs of active extravasation though this is likely the site of bleeding. A moderate amount of stool, formed stool remains present in the rectum without increase in moderate rectal distension that was seen on the previous exam. Nodular soft tissue changes in the sigmoid mesentery show no change compatible with tumor in this area. Dominant area of the mass measuring up to 6.3 x 4.1 cm as compared to 5.9 x 3.9 cm. There is higher density material within the upstream lumen  of the colon which is mildly distended previously collapsed on the prior imaging study. Lymphatic: Normal caliber of the abdominal aorta as outlined above. Signs of mesenteric adenopathy and LEFT pelvic sidewall adenopathy the not changed since previous imaging. Nodule or lymph node at the sacro sciatic notch measuring 14 mm is stable over the short interval. Reproductive: Uterus is inseparable from the pelvic mass that arises from the sigmoid colon. Other: No free air or signs of ascites. Musculoskeletal: Osteopenia. No acute musculoskeletal process or destructive bone finding. IMPRESSION: VASCULAR No acute vascular process such as discrete site of extravasation identified on the current study. NON-VASCULAR Prominent vessels about a large pelvic mass compatible with known colonic neoplasm with extension beyond the rectal lumen, involvement of adjacent pelvic structures now with upstream dilation and high-density material in the lumen of the diverted colon below the double barrel ostomy. Findings are compatible with tumor related bleeding. Signs of metastatic disease and local invasion. Enlarging colonic neoplasm and increasing size in the short interval of the RIGHT hepatic lobe mass. Electronically Signed   By: Zetta Bills M.D.   On: 01/14/2022 21:27    Disposition Plan & Communication  Patient status: Inpatient  Admitted From: Home Planned disposition location: Home Anticipated discharge date: 2/18 pending GI and heme/onc evaluation and/or interventions  Family Communication: none    Author: Richarda Osmond, DO [facility] 01/16/2022, 7:06 AM   Available by Epic secure chat 7AM-7PM. If 7PM-7AM, please contact night-coverage.  [facility] contact information found on [email or web address].

## 2022-01-16 NOTE — Consult Note (Signed)
[place] CONSULT NOTE  Patient Care Team: Pcp, No as PCP - General Clent Jacks, RN as Oncology Nurse Navigator Sindy Guadeloupe, MD as Consulting Physician (Oncology)  CHIEF COMPLAINTS/PURPOSE OF CONSULTATION: Colon cancer/bleeding from the colostomy  HISTORY OF PRESENTING ILLNESS:  Alicia Coleman 66 y.o.  female with a history of metastatic colon cancer with colostomy and palliative chemotherapy is currently admitted to hospital for blood in the colostomy bag.  Patient is currently on FOLFIRI with Avastin.  On admission hemoglobin around 8.5.  CT scan abdomen pelvis vascular study-prominent blood vessels around large pelvic mass with extension beyond rectal lumen involvement of the pelvic structures; signs of metastatic disease local invasion likely colonic neoplasm increasing size of the right hepatic lobe mass.   Patient interim underwent EGD colonoscopy this afternoon.  Patient complains of fatigue.  Denies any pain.  Patient currently has 5-FU pump infusing.  Review of Systems  Constitutional:  Positive for malaise/fatigue and weight loss. Negative for chills, diaphoresis and fever.  HENT:  Negative for nosebleeds and sore throat.   Eyes:  Negative for double vision.  Respiratory:  Positive for shortness of breath. Negative for cough, hemoptysis, sputum production and wheezing.   Cardiovascular:  Negative for chest pain, palpitations, orthopnea and leg swelling.  Gastrointestinal:  Positive for blood in stool. Negative for abdominal pain, constipation, diarrhea, heartburn, melena, nausea and vomiting.  Genitourinary:  Negative for dysuria, frequency and urgency.  Musculoskeletal:  Negative for back pain and joint pain.  Skin: Negative.  Negative for itching and rash.  Neurological:  Positive for dizziness. Negative for tingling, focal weakness, weakness and headaches.  Endo/Heme/Allergies:  Does not bruise/bleed easily.  Psychiatric/Behavioral:  Negative for  depression. The patient is not nervous/anxious and does not have insomnia.     MEDICAL HISTORY:  Past Medical History:  Diagnosis Date   Allergy    Anemia    Cancer ([place])     SURGICAL HISTORY: Past Surgical History:  Procedure Laterality Date   FLEXIBLE SIGMOIDOSCOPY N/A 04/24/2021   Procedure: FLEXIBLE SIGMOIDOSCOPY;  Surgeon: [name], MD;  Location: [facility] ENDOSCOPY;  Service: Gastroenterology;  Laterality: N/A;   IR CV LINE INJECTION  11/13/2021   IR IMAGING GUIDED PORT INSERTION  11/19/2021   IR REMOVAL TUN ACCESS W/ PORT W/O FL MOD SED  11/19/2021   PORTACATH PLACEMENT N/A 05/01/2021   Procedure: INSERTION PORT-A-CATH;  Surgeon: Jules Husbands, MD;  Location: [facility] ORS;  Service: General;  Laterality: N/A;    SOCIAL HISTORY: Social History   Socioeconomic History   Marital status: Married    Spouse name: Not on file   Number of children: Not on file   Years of education: Not on file   Highest education level: Not on file  Occupational History   Not on file  Tobacco Use   Smoking status: Never   Smokeless tobacco: Never  Vaping Use   Vaping Use: Never used  Substance and Sexual Activity   Alcohol use: Not Currently   Drug use: Never   Sexual activity: Not Currently  Other Topics Concern   Not on file  Social History Narrative   Not on file   Social Determinants of Health   Financial Resource Strain: Not on file  Food Insecurity: Not on file  Transportation Needs: Not on file  Physical Activity: Not on file  Stress: Not on file  Social Connections: Not on file  Intimate Partner Violence: Not on file    FAMILY  HISTORY: Family History  Problem Relation Age of Onset   Stroke Mother    Hypertension Mother    Cancer Father     ALLERGIES:  is allergic to oxaliplatin.  MEDICATIONS:  Current Facility-Administered Medications  Medication Dose Route Frequency Provider Last Rate Last Admin   0.9 %  sodium chloride infusion   Intravenous Continuous Athena Masse, MD 75 mL/hr at 01/16/22 1141 Continued from Pre-op at 01/16/22 1141   0.9 %  sodium chloride infusion   Intravenous Continuous [name], MD 20 mL/hr at 01/16/22 1112 New Bag at 01/16/22 1112   acetaminophen (TYLENOL) tablet 650 mg  650 mg Oral Q6H PRN Athena Masse, MD       Or   acetaminophen (TYLENOL) suppository 650 mg  650 mg Rectal Q6H PRN Athena Masse, MD       Chlorhexidine Gluconate Cloth 2 % PADS 6 each  6 each Topical Q0600 Athena Masse, MD   6 each at 01/16/22 0641   [START ON 01/17/2022] iron sucrose (VENOFER) 200 mg in sodium chloride 0.9 % 100 mL IVPB  200 mg Intravenous Daily Charlaine Dalton R, MD       ondansetron [facility]) tablet 4 mg  4 mg Oral Q6H PRN Athena Masse, MD       Or   ondansetron [facility]) injection 4 mg  4 mg Intravenous Q6H PRN Athena Masse, MD       [START ON 01/18/2022] pantoprazole (PROTONIX) injection 40 mg  40 mg Intravenous Q12H Lucrezia Starch, MD          .  PHYSICAL EXAMINATION:  Vitals:   01/16/22 1743 01/16/22 2004  BP: 120/77 108/75  Pulse: 86 81  Resp: 16 18  Temp: 98.6 F (37 C) 98.5 F (36.9 C)  SpO2: 100% 100%   Filed Weights   01/14/22 1954 01/15/22 0024 01/16/22 1114  Weight: 124 lb (56.2 kg) 127 lb 3.3 oz (57.7 kg) 127 lb 3.3 oz (57.7 kg)  Colostomy draining green color stool.  No evidence of blood.  Physical Exam Vitals and nursing note reviewed.  HENT:     Head: Normocephalic and atraumatic.     Mouth/Throat:     Pharynx: Oropharynx is clear.  Eyes:     Extraocular Movements: Extraocular movements intact.     Pupils: Pupils are equal, round, and reactive to light.  Cardiovascular:     Rate and Rhythm: Normal rate and regular rhythm.  Pulmonary:     Comments: Decreased breath sounds bilaterally.  Abdominal:     Palpations: Abdomen is soft.  Musculoskeletal:        General: Normal range of motion.     Cervical back: Normal range of motion.  Skin:    General: Skin is warm.  Neurological:      General: No focal deficit present.     Mental Status: She is alert and oriented to person, place, and time.  Psychiatric:        Behavior: Behavior normal.        Judgment: Judgment normal.     LABORATORY DATA:  I have reviewed the data as listed Lab Results  Component Value Date   WBC 4.5 01/16/2022   HGB 8.6 (L) 01/16/2022   HCT 26.7 (L) 01/16/2022   MCV 89.6 01/16/2022   PLT 121 (L) 01/16/2022   Recent Labs    12/31/21 0959 01/14/22 0919 01/14/22 1958  NA 135 134* 137  K 3.5 3.7 3.7  CL 103 103 107  CO2 24 27 24   GLUCOSE 110* 103* 178*  BUN 9 10 14   CREATININE 0.51 0.45 0.73  CALCIUM 9.0 8.7* 9.0  GFRNONAA >60 >60 >60  PROT 7.3 6.6 6.5  ALBUMIN 3.2* 3.0* 2.8*  AST 18 21 20   ALT 11 13 12   ALKPHOS 77 80 72  BILITOT 0.8 0.2* 0.5    RADIOGRAPHIC STUDIES: I have personally reviewed the radiological images as listed and agreed with the findings in the report. CT Angio Abd/Pel W and/or Wo Contrast  Result Date: 01/14/2022 CLINICAL DATA:  A 66 year old female presents for evaluation of GI bleeding. EXAM: CTA ABDOMEN AND PELVIS WITHOUT AND WITH CONTRAST TECHNIQUE: Multidetector CT imaging of the abdomen and pelvis was performed using the standard protocol during bolus administration of intravenous contrast. Multiplanar reconstructed images and MIPs were obtained and reviewed to evaluate the vascular anatomy. RADIATION DOSE REDUCTION: This exam was performed according to the departmental dose-optimization program which includes automated exposure control, adjustment of the mA and/or kV according to patient size and/or use of iterative reconstruction technique. CONTRAST:  125mL OMNIPAQUE IOHEXOL 350 MG/ML SOLN COMPARISON:  Comparison is made with December 15, 2021. FINDINGS: VASCULAR Aorta: Normal caliber aorta without aneurysm, dissection, vasculitis or significant stenosis. Celiac: Widely patent with normal caliber. No signs of dissection or aneurysm. SMA: Patent without  evidence of aneurysm, dissection, vasculitis or significant stenosis. Renals: Single renal arteries which are patent without signs of acute process. IMA: Patent without evidence of aneurysm, dissection, vasculitis or significant stenosis. Inflow: Patent without evidence of aneurysm, dissection, vasculitis or significant stenosis. Proximal Outflow: Bilateral common femoral and visualized portions of the superficial and profunda femoral arteries are patent without evidence of aneurysm, dissection, vasculitis or significant stenosis. Veins: Smooth contours of the IVC. Normal caliber of the iliac veins. Review of the MIP images confirms the above findings. NON-VASCULAR Lower chest: The incidental imaging of the lung bases with basilar atelectasis, no effusion or sign of consolidation. Hepatobiliary: Signs of RIGHT hepatic mass (image 22/7) 5.8 x 5.4 as compared to 5.3 x 5.2 cm on the study from January 16th cephalad extension from the mass appears similar. There are no new hepatic lesions. The portal vein is patent. No pericholecystic stranding. Pancreas: Unremarkable. No pancreatic ductal dilatation or surrounding inflammatory changes. Spleen: Unremarkable. Adrenals/Urinary Tract: Adrenal glands are normal. There is symmetric renal enhancement with a small LEFT renal cyst. No hydronephrosis. No perinephric stranding. No stranding adjacent to the urinary bladder. Stomach/Bowel: No acute small bowel process. Stomach is unremarkable. Small bowel is nondilated. The appendix is normal. Double barrel ostomy in the LEFT lower quadrant, diverting colostomy is again noted. Downstream from the diverting colostomy is a large colonic mass with extra colonic extension and central necrotic debris which is in communication with the colonic lumen. This shows no substantial change compared to imaging from January 16th and there are signs of extensive enhancing tumor that abuts the uterus and extends along the LEFT rectal wall,  associated with LEFT pelvic sidewall nodularity near the sacro sciatic notch which is unchanged. No overt signs of active extravasation though this is likely the site of bleeding. A moderate amount of stool, formed stool remains present in the rectum without increase in moderate rectal distension that was seen on the previous exam. Nodular soft tissue changes in the sigmoid mesentery show no change compatible with tumor in this area. Dominant area of the mass measuring up to 6.3 x 4.1 cm as compared to 5.9  x 3.9 cm. There is higher density material within the upstream lumen of the colon which is mildly distended previously collapsed on the prior imaging study. Lymphatic: Normal caliber of the abdominal aorta as outlined above. Signs of mesenteric adenopathy and LEFT pelvic sidewall adenopathy the not changed since previous imaging. Nodule or lymph node at the sacro sciatic notch measuring 14 mm is stable over the short interval. Reproductive: Uterus is inseparable from the pelvic mass that arises from the sigmoid colon. Other: No free air or signs of ascites. Musculoskeletal: Osteopenia. No acute musculoskeletal process or destructive bone finding. IMPRESSION: VASCULAR No acute vascular process such as discrete site of extravasation identified on the current study. NON-VASCULAR Prominent vessels about a large pelvic mass compatible with known colonic neoplasm with extension beyond the rectal lumen, involvement of adjacent pelvic structures now with upstream dilation and high-density material in the lumen of the diverted colon below the double barrel ostomy. Findings are compatible with tumor related bleeding. Signs of metastatic disease and local invasion. Enlarging colonic neoplasm and increasing size in the short interval of the RIGHT hepatic lobe mass. Electronically Signed   By: Zetta Bills M.D.   On: 01/14/2022 58:56    #66 year old female patient with a history of metastatic colorectal cancer currently on  FOLFOX plus Avastin is currently admitted to hospital for bleeding from the ostomy.  #Metastatic colorectal cancer-on FOLFIRI plus Avastin.  CT scan shows progressive disease.  #Bright red blood from the colostomy-s/p EGD/colonoscopy-no obvious evidence of any bleeding noted.  Possibly peristomal bleeding.   #Anemia secondary to #2-s/p PRBC transfusion.  Recommendations:  #Given the absence of any GI bleed-Protonix could be discontinued.   #Discussed with patient regarding radiation for palliation of bleeding.  This could be arranged as outpatient.  Discussed with Dr. Donella Stade  #Recommend IV iron infusion given the ongoing anemia/blood loss.  #Clinically stable patient will be discharged from oncology standpoint.  We will make follow-up appointments at the cancer center with patient's primary oncologist Dr. Janese Banks.  Discussed with Dr. Janese Banks.  All questions were answered. The patient knows to call the clinic with any problems, questions or concerns.    Cammie Sickle, MD 01/16/2022 10:38 PM

## 2022-01-16 NOTE — Op Note (Signed)
[facility] Gastroenterology Patient Name: Alicia Coleman Procedure Date: 01/16/2022 11:39 AM MRN: [id number] Account #: [id number] Date of Birth: 09-Jan-1956 Admit Type: Outpatient Age: 66 Room: [facility] ENDO ROOM 4 Gender: Female Note Status: Finalized Instrument Name: Upper Endoscope [id number] Procedure:             Upper GI endoscopy Indications:           Melena Providers:             [name] MD, MD Referring MD:          No Local Md, MD (Referring MD) Medicines:             Monitored Anesthesia Care Complications:         No immediate complications. Procedure:             Pre-Anesthesia Assessment:                        - Prior to the procedure, a History and Physical was                         performed, and patient medications, allergies and                         sensitivities were reviewed. The patient's tolerance                         of previous anesthesia was reviewed.                        - The risks and benefits of the procedure and the                         sedation options and risks were discussed with the                         patient. All questions were answered and informed                         consent was obtained.                        - ASA Grade Assessment: III - A patient with severe                         systemic disease.                        After obtaining informed consent, the endoscope was                         passed under direct vision. Throughout the procedure,                         the patient's blood pressure, pulse, and oxygen                         saturations were monitored continuously. The Endoscope                         was introduced  through the mouth, and advanced to the                         third part of duodenum. After obtaining informed                         consent, the endoscope was passed under direct vision.                         Throughout the procedure, the patient's blood                          pressure, pulse, and oxygen saturations were monitored                         continuously.The upper GI endoscopy was accomplished                         with ease. The patient tolerated the procedure well. Findings:      The esophagus was normal.      The stomach was normal.      The examined duodenum was normal. Impression:            - Normal esophagus.                        - Normal stomach.                        - Normal examined duodenum.                        - No specimens collected. Recommendation:        - Perform a colonoscopy today. Procedure Code(s):     --- Professional ---                        [phone], Esophagogastroduodenoscopy, flexible,                         transoral; diagnostic, including collection of                         specimen(s) by brushing or washing, when performed                         (separate procedure) Diagnosis Code(s):     --- Professional ---                        K92.1, Melena (includes Hematochezia) CPT copyright 2019 American Medical Association. All rights reserved. The codes documented in this report are preliminary and upon coder review may  be revised to meet current compliance requirements. [name], MD [name] MD, MD 01/16/2022 11:51:40 AM This report has been signed electronically. Number of Addenda: 0 Note Initiated On: 01/16/2022 11:39 AM Estimated Blood Loss:  Estimated blood loss: none.      [facility]

## 2022-01-16 NOTE — Transfer of Care (Signed)
Immediate Anesthesia Transfer of Care Note  Patient: Alicia Coleman  Procedure(s) Performed: COLONOSCOPY WITH PROPOFOL ESOPHAGOGASTRODUODENOSCOPY (EGD)  Patient Location: Endoscopy Unit  Anesthesia Type:General  Level of Consciousness: drowsy  Airway & Oxygen Therapy: Patient Spontanous Breathing  Post-op Assessment: Report given to RN and Post -op Vital signs reviewed and stable  Post vital signs: Reviewed and stable  Last Vitals:  Vitals Value Taken Time  BP 97/69 01/16/22 1210  Temp 36.1 C 01/16/22 1210  Pulse 76 01/16/22 1210  Resp 13 01/16/22 1210  SpO2 100 % 01/16/22 1210    Last Pain:  Vitals:   01/16/22 1210  TempSrc: Temporal  PainSc: Asleep         Complications: No notable events documented.

## 2022-01-17 DIAGNOSIS — D500 Iron deficiency anemia secondary to blood loss (chronic): Secondary | ICD-10-CM

## 2022-01-17 LAB — CBC
HCT: 26.1 % — ABNORMAL LOW (ref 36.0–46.0)
Hemoglobin: 8.3 g/dL — ABNORMAL LOW (ref 12.0–15.0)
MCH: 28.7 pg (ref 26.0–34.0)
MCHC: 31.8 g/dL (ref 30.0–36.0)
MCV: 90.3 fL (ref 80.0–100.0)
Platelets: 130 10*3/uL — ABNORMAL LOW (ref 150–400)
RBC: 2.89 MIL/uL — ABNORMAL LOW (ref 3.87–5.11)
RDW: 13.6 % (ref 11.5–15.5)
WBC: 3.9 10*3/uL — ABNORMAL LOW (ref 4.0–10.5)
nRBC: 0 % (ref 0.0–0.2)

## 2022-01-17 MED ORDER — HEPARIN SOD (PORK) LOCK FLUSH 100 UNIT/ML IV SOLN
500.0000 [IU] | Freq: Once | INTRAVENOUS | Status: DC
  Filled 2022-01-17: qty 5

## 2022-01-17 NOTE — Discharge Instructions (Addendum)
Your hemoglobin is stable at 8.3 today and bleeding has stopped so you are safe to be discharged from the hospital today. Please follow up with your oncologist within a week to discuss your treatment plan and recheck your hemoglobin again.

## 2022-01-17 NOTE — Progress Notes (Incomplete)
PROGRESS NOTE  Alicia Coleman    DOB: [birth year], 66 y.o.  JKD:[id number]    Code Status: Full Code   DOA: 01/14/2022   LOS: 2   Brief hospital course  Alicia Coleman is a 66 y.o. female with a PMH significant for metastatic colon cancer diagnosed 04/2021 with colostomy and palliative chemotherapy complicated by an allergic reaction.  Recently restarted on chemotherapy and evaluated most recently by oncology on 12/31/2021. They presented from home to the ED on 01/14/2022 with bleeding rectally and in her colostomy bag x 1 days.  She denies symptoms of anemia. In the ED, it was found that they had mild tachycardia but otherwise hemodynamically stable with hemoglobin of 9.2 which is close to baseline.  CT scan was consistent with her metastatic disease causing bleeding. They were treated with IV Protonix.  GI was consulted for further evaluation. Patient was admitted to medicine service for further workup and management of GI bleeding as outlined in detail below.  01/17/22 -stable  Assessment & Plan  Principal Problem:   Hematochezia Active Problems:   Iron deficiency anemia   Metastatic colon cancer in female [facility])   Colostomy status ([place])   Gastrointestinal hemorrhage   Acute blood loss anemia   GI bleed  GI bleed-related to known malignancy.  Hemoglobin 9.7 on presentation which gradually decreased 9.7>9.2>7.1 and required 1 unit PRBCs.  Patient states that bleeding into colostomy bag has diminished. Hemoglobin 8.6 s/p transfusion. -GI following, appreciate recommendations -CBC a.m. - colonoscopy planned today  Anemia-multifactorial.  Acute blood loss, chemotherapy side effect, iron deficiency -heme/onc following, appreciate recs  Metastatic colon cancer.  CTA abdomen and pelvis showed enlarging colonic neoplasm and increasing size in the short interval of the right hepatic lobe mass -Continue palliative chemotherapy  - pump to be discontinued today  Body mass index is 20.53  kg/m.  VTE ppx: SCDs Start: 01/14/22 2325  Diet:     Diet   Diet Heart Room service appropriate? Yes; Fluid consistency: Thin   Subjective 01/17/22    Pt reports feeling some mild abdominal pain but is able to rest. She denies further bleeding. No other complaints at this time.     Objective   Vitals:   01/16/22 1743 01/16/22 2004 01/17/22 0123 01/17/22 0503  BP: 120/77 108/75 101/61 100/68  Pulse: 86 81 82 89  Resp: 16 18 17 18   Temp: 98.6 F (37 C) 98.5 F (36.9 C)  97.9 F (36.6 C)  TempSrc: Oral Oral Oral Oral  SpO2: 100% 100% 98% 100%  Weight:      Height:        Intake/Output Summary (Last 24 hours) at 01/17/2022 0700 Last data filed at 01/16/2022 1211 Gross per 24 hour  Intake 400 ml  Output --  Net 400 ml    Filed Weights   01/14/22 1954 01/15/22 0024 01/16/22 1114  Weight: 56.2 kg 57.7 kg 57.7 kg     Physical Exam:  General: awake, alert, NAD HEENT: atraumatic, clear conjunctiva, anicteric sclera, MMM, hearing grossly normal Respiratory: normal respiratory effort. Cardiovascular: normal S1/S2, RRR, no JVD, murmurs, quick capillary refill  Gastrointestinal: soft, NT, ND. Colostomy bag intact with liquid stool Nervous: A&O x3. no gross focal neurologic deficits, normal speech Extremities: moves all equally, no edema, low tone Skin: dry, intact, normal temperature, normal color. No rashes, lesions or ulcers on exposed skin Psychiatry: normal mood, congruent affect  Labs   I have personally reviewed the following labs and imaging studies CBC  Component Value Date/Time   WBC 4.5 01/16/2022 0428   RBC 2.98 (L) 01/16/2022 0428   HGB 8.6 (L) 01/16/2022 0428   HCT 26.7 (L) 01/16/2022 0428   PLT 121 (L) 01/16/2022 0428   MCV 89.6 01/16/2022 0428   MCH 28.9 01/16/2022 0428   MCHC 32.2 01/16/2022 0428   RDW 13.9 01/16/2022 0428   LYMPHSABS 0.3 (L) 01/14/2022 1958   MONOABS 0.1 01/14/2022 1958   EOSABS 0.0 01/14/2022 1958   BASOSABS 0.0 01/14/2022  1958   BMP Latest Ref Rng & Units 01/14/2022 01/14/2022 12/31/2021  Glucose 70 - 99 mg/dL 178(H) 103(H) 110(H)  BUN 8 - 23 mg/dL 14 10 9   Creatinine 0.44 - 1.00 mg/dL 0.73 0.45 0.51  Sodium 135 - 145 mmol/L 137 134(L) 135  Potassium 3.5 - 5.1 mmol/L 3.7 3.7 3.5  Chloride 98 - 111 mmol/L 107 103 103  CO2 22 - 32 mmol/L 24 27 24   Calcium 8.9 - 10.3 mg/dL 9.0 8.7(L) 9.0    No results found.  Disposition Plan & Communication  Patient status: Inpatient  Admitted From: Home Planned disposition location: Home Anticipated discharge date: 2/18 pending GI and heme/onc evaluation and/or interventions  Family Communication: none    Author: Richarda Osmond, DO [facility] 01/17/2022, 7:00 AM   Available by Epic secure chat 7AM-7PM. If 7PM-7AM, please contact night-coverage.  [facility] contact information found on [email or web address].

## 2022-01-17 NOTE — TOC CM/SW Note (Signed)
°  Transition of Care [facility]) Screening Note   Patient Details  Name: Alicia Coleman Date of Birth: Apr 05, 1956   Transition of Care [facility]) CM/SW Contact:    Magnus Ivan, LCSW Phone Number: 01/17/2022, 8:48 AM    Transition of Care Department [facility]) has reviewed patient and no TOC needs have been identified at this time. We will continue to monitor patient advancement through interdisciplinary progression rounds. If new patient transition needs arise, please place a TOC consult.

## 2022-01-17 NOTE — Discharge Summary (Signed)
Physician Discharge Summary  Patient: Alicia Coleman AJG:[id number] DOB: 02-03-1956   Code Status: Full Code Admit date: 01/14/2022 Discharge date: 01/17/2022 Disposition: Home, No home health services recommended PCP: Pcp, No  Recommendations for Outpatient Follow-up:  Follow up with PCP within 1-2 weeks Recommend CBC Follow up with oncology   Discharge Diagnoses:  Principal Problem:   Hematochezia Active Problems:   Iron deficiency anemia   Metastatic colon cancer in female [facility])   Colostomy status ([place])   Gastrointestinal hemorrhage   Acute blood loss anemia   GI bleed  Brief Hospital Course Summary: Alicia Coleman is a 66 y.o. female with a PMH significant for metastatic colon cancer diagnosed 04/2021 with colostomy and palliative chemotherapy complicated by an allergic reaction.  Recently restarted on chemotherapy and evaluated most recently by oncology on 12/31/2021. They presented from home to the ED on 01/14/2022 with bleeding rectally and in her colostomy bag x 1 days.  She denies symptoms of anemia. In the ED, it was found that they had mild tachycardia but otherwise hemodynamically stable with hemoglobin of 9.2 which is close to baseline.  CT scan was consistent with her metastatic disease causing bleeding. They were treated with IV Protonix.  GI was consulted for further evaluation. She underwent endoscopy 2/17 which did not show any active bleed.  Patient did not have any further rectal bleeding throughout her stay and the bleeding in her colostomy bag slowed and stopped prior to discharge. She did require 1 unit pRBCs blood transfusions and remained stable thereafter. Hgb 8.3 on day of discharge.  She was discharged for outpatient follow up.   Additionally, patient completed her 5 f-u treatment inpatient and tolerated well.   Discharge Condition: Good, improved Recommended discharge diet: Regular healthy  diet  Consultations: GI Heme/onc  Procedures/Studies: colonoscopy   Allergies as of 01/17/2022       Reactions   Oxaliplatin Nausea And Vomiting        Medication List     STOP taking these medications    folic acid 1 MG tablet Commonly known as: FOLVITE   lidocaine-prilocaine cream Commonly known as: EMLA   oxycodone 5 MG capsule Commonly known as: OXY-IR   potassium chloride SA 20 MEQ tablet Commonly known as: KLOR-CON M       TAKE these medications    acetaminophen 325 MG tablet Commonly known as: TYLENOL Take 650 mg by mouth every 6 (six) hours as needed.         Subjective   Pt reports feeling improved. She has no pain at colostomy site. Denies any more bleeding in stool. She is able to tolerate a regular diet and would like to go home. Objective  Blood pressure 100/68, pulse 89, temperature 97.9 F (36.6 C), temperature source Oral, resp. rate 18, height 5\' 6"  (1.676 m), weight 57.7 kg, SpO2 100 %.   General: Pt is alert, awake, not in acute distress Cardiovascular: RRR, S1/S2 +, no rubs, no gallops Respiratory: CTA bilaterally, no wheezing, no rhonchi Abdominal: Soft, NT, ND, bowel sounds +. No blood in colostomy bag. Extremities: no edema, no cyanosis   The results of significant diagnostics from this hospitalization (including imaging, microbiology, ancillary and laboratory) are listed below for reference.   Imaging studies: CT Angio Abd/Pel W and/or Wo Contrast  Result Date: 01/14/2022 CLINICAL DATA:  A 66 year old female presents for evaluation of GI bleeding. EXAM: CTA ABDOMEN AND PELVIS WITHOUT AND WITH CONTRAST TECHNIQUE: Multidetector CT imaging of the abdomen and  pelvis was performed using the standard protocol during bolus administration of intravenous contrast. Multiplanar reconstructed images and MIPs were obtained and reviewed to evaluate the vascular anatomy. RADIATION DOSE REDUCTION: This exam was performed according to the  departmental dose-optimization program which includes automated exposure control, adjustment of the mA and/or kV according to patient size and/or use of iterative reconstruction technique. CONTRAST:  123mL OMNIPAQUE IOHEXOL 350 MG/ML SOLN COMPARISON:  Comparison is made with December 15, 2021. FINDINGS: VASCULAR Aorta: Normal caliber aorta without aneurysm, dissection, vasculitis or significant stenosis. Celiac: Widely patent with normal caliber. No signs of dissection or aneurysm. SMA: Patent without evidence of aneurysm, dissection, vasculitis or significant stenosis. Renals: Single renal arteries which are patent without signs of acute process. IMA: Patent without evidence of aneurysm, dissection, vasculitis or significant stenosis. Inflow: Patent without evidence of aneurysm, dissection, vasculitis or significant stenosis. Proximal Outflow: Bilateral common femoral and visualized portions of the superficial and profunda femoral arteries are patent without evidence of aneurysm, dissection, vasculitis or significant stenosis. Veins: Smooth contours of the IVC. Normal caliber of the iliac veins. Review of the MIP images confirms the above findings. NON-VASCULAR Lower chest: The incidental imaging of the lung bases with basilar atelectasis, no effusion or sign of consolidation. Hepatobiliary: Signs of RIGHT hepatic mass (image 22/7) 5.8 x 5.4 as compared to 5.3 x 5.2 cm on the study from January 16th cephalad extension from the mass appears similar. There are no new hepatic lesions. The portal vein is patent. No pericholecystic stranding. Pancreas: Unremarkable. No pancreatic ductal dilatation or surrounding inflammatory changes. Spleen: Unremarkable. Adrenals/Urinary Tract: Adrenal glands are normal. There is symmetric renal enhancement with a small LEFT renal cyst. No hydronephrosis. No perinephric stranding. No stranding adjacent to the urinary bladder. Stomach/Bowel: No acute small bowel process. Stomach is  unremarkable. Small bowel is nondilated. The appendix is normal. Double barrel ostomy in the LEFT lower quadrant, diverting colostomy is again noted. Downstream from the diverting colostomy is a large colonic mass with extra colonic extension and central necrotic debris which is in communication with the colonic lumen. This shows no substantial change compared to imaging from January 16th and there are signs of extensive enhancing tumor that abuts the uterus and extends along the LEFT rectal wall, associated with LEFT pelvic sidewall nodularity near the sacro sciatic notch which is unchanged. No overt signs of active extravasation though this is likely the site of bleeding. A moderate amount of stool, formed stool remains present in the rectum without increase in moderate rectal distension that was seen on the previous exam. Nodular soft tissue changes in the sigmoid mesentery show no change compatible with tumor in this area. Dominant area of the mass measuring up to 6.3 x 4.1 cm as compared to 5.9 x 3.9 cm. There is higher density material within the upstream lumen of the colon which is mildly distended previously collapsed on the prior imaging study. Lymphatic: Normal caliber of the abdominal aorta as outlined above. Signs of mesenteric adenopathy and LEFT pelvic sidewall adenopathy the not changed since previous imaging. Nodule or lymph node at the sacro sciatic notch measuring 14 mm is stable over the short interval. Reproductive: Uterus is inseparable from the pelvic mass that arises from the sigmoid colon. Other: No free air or signs of ascites. Musculoskeletal: Osteopenia. No acute musculoskeletal process or destructive bone finding. IMPRESSION: VASCULAR No acute vascular process such as discrete site of extravasation identified on the current study. NON-VASCULAR Prominent vessels about a large pelvic mass  compatible with known colonic neoplasm with extension beyond the rectal lumen, involvement of adjacent  pelvic structures now with upstream dilation and high-density material in the lumen of the diverted colon below the double barrel ostomy. Findings are compatible with tumor related bleeding. Signs of metastatic disease and local invasion. Enlarging colonic neoplasm and increasing size in the short interval of the RIGHT hepatic lobe mass. Electronically Signed   By: Zetta Bills M.D.   On: 01/14/2022 21:27    Labs: Basic Metabolic Panel: Recent Labs  Lab 01/14/22 0919 01/14/22 1958  NA 134* 137  K 3.7 3.7  CL 103 107  CO2 27 24  GLUCOSE 103* 178*  BUN 10 14  CREATININE 0.45 0.73  CALCIUM 8.7* 9.0   CBC: Recent Labs  Lab 01/14/22 0919 01/14/22 1958 01/15/22 0410 01/15/22 1049 01/16/22 0428 01/17/22 0658  WBC 5.7 4.2  --   --  4.5 3.9*  NEUTROABS 3.7 3.8  --   --   --   --   HGB 9.7* 9.2* 7.1* 8.4* 8.6* 8.3*  HCT 30.5* 30.1* 22.8* 25.6* 26.7* 26.1*  MCV 92.1 93.2  --   --  89.6 90.3  PLT 154 188  --   --  121* 130*   Microbiology: none  Time coordinating discharge: Over 30 minutes  Richarda Osmond, MD  [facility] 01/17/2022, 9:10 AM

## 2022-01-18 ENCOUNTER — Other Ambulatory Visit: Payer: Self-pay | Admitting: *Deleted

## 2022-01-18 DIAGNOSIS — C189 Malignant neoplasm of colon, unspecified: Secondary | ICD-10-CM

## 2022-01-18 NOTE — Progress Notes (Signed)
I connected with Alicia Coleman on 01/18/22 at  9:45 AM EST by video enabled telemedicine visit and verified that I am speaking with the correct person using two identifiers.   I discussed the limitations, risks, security and privacy concerns of performing an evaluation and management service by telemedicine and the availability of in-person appointments. I also discussed with the patient that there may be a patient responsible charge related to this service. The patient expressed understanding and agreed to proceed.  Other persons participating in the visit and their role in the encounter:  none  Patient's location:  cancer center Provider's location:  home (illness)  Chief Complaint: On treatment assessment prior to cycle 15 of palliative 5-FU bevacizumab and irinotecan chemotherapy  History of present illness:  Patient is a 66 year old female who was referred from the ER for severe iron deficiency anemia.  She received IV Venofer for that.  I was concerned about malignancy given the degree of anemia and therefore obtain CT abdomen and pelvis with contrast CT showed 6.4 cm enhancing mass in the right liver and another mass measuring 4 cm.  Large volume stool in the right colon transverse and descending colon with irregular mass lesion in the distal sigmoid colon extending into the rectosigmoid junction with lateral extracolonic extension into the pelvic sidewall.  Apparent luminal narrowing at the level of the lesion which accounts for large volume of stool proximally.  Multiple pericolonic and perirectal lymph nodes.  Multiple peritoneal nodules are seen in the lower right pelvic sidewall.  Colon mass appears to be invading posterior uterus.Thrombosis of the superior rectal vein may be bland thrombus although direct tumor invasion not excluded.   Patient had a flexible sigmoidoscopy which showed a large obstructing mass in the rectosigmoid colon.  Scope could not be traversed beyond the obstruction.   Biopsies were taken and were consistent with adenocarcinoma. Patient had a diverting colostomy on 05/01/2021 with Dr. Dahlia Byes   Patient started palliative FOLFOX chemotherapy in June 2022 with plans to add Avastin after 6 cycles of treatment.  Patient developed allergic reaction to oxaliplatin with cycle 10 as evidenced by diaphoresis vomiting and flushing.  Plan is to hold off on further doses of oxaliplatin.  She has K-ras mutation positive   Progression noted on scans in January 2023.  Plan to switch to FOLFIRI bevacizumab  Interval history tolerating chemotherapy well.  Has occasional loose stools in her colostomy bag as well as through her rectum.  Denies any bleeding in stool   Review of Systems  Constitutional:  Positive for malaise/fatigue. Negative for chills, fever and weight loss.  HENT:  Negative for congestion, ear discharge and nosebleeds.   Eyes:  Negative for blurred vision.  Respiratory:  Negative for cough, hemoptysis, sputum production, shortness of breath and wheezing.   Cardiovascular:  Negative for chest pain, palpitations, orthopnea and claudication.  Gastrointestinal:  Positive for diarrhea. Negative for abdominal pain, blood in stool, constipation, heartburn, melena, nausea and vomiting.  Genitourinary:  Negative for dysuria, flank pain, frequency, hematuria and urgency.  Musculoskeletal:  Negative for back pain, joint pain and myalgias.  Skin:  Negative for rash.  Neurological:  Negative for dizziness, tingling, focal weakness, seizures, weakness and headaches.  Endo/Heme/Allergies:  Does not bruise/bleed easily.  Psychiatric/Behavioral:  Negative for depression and suicidal ideas. The patient does not have insomnia.    Allergies  Allergen Reactions   Oxaliplatin Nausea And Vomiting    Past Medical History:  Diagnosis Date   Allergy  Anemia    Cancer [facility])     Past Surgical History:  Procedure Laterality Date   FLEXIBLE SIGMOIDOSCOPY N/A 04/24/2021    Procedure: FLEXIBLE SIGMOIDOSCOPY;  Surgeon: [name], MD;  Location: [facility] ENDOSCOPY;  Service: Gastroenterology;  Laterality: N/A;   IR CV LINE INJECTION  11/13/2021   IR IMAGING GUIDED PORT INSERTION  11/19/2021   IR REMOVAL TUN ACCESS W/ PORT W/O FL MOD SED  11/19/2021   PORTACATH PLACEMENT N/A 05/01/2021   Procedure: INSERTION PORT-A-CATH;  Surgeon: Jules Husbands, MD;  Location: [facility] ORS;  Service: General;  Laterality: N/A;    Social History   Socioeconomic History   Marital status: Married    Spouse name: Not on file   Number of children: Not on file   Years of education: Not on file   Highest education level: Not on file  Occupational History   Not on file  Tobacco Use   Smoking status: Never   Smokeless tobacco: Never  Vaping Use   Vaping Use: Never used  Substance and Sexual Activity   Alcohol use: Not Currently   Drug use: Never   Sexual activity: Not Currently  Other Topics Concern   Not on file  Social History Narrative   Not on file   Social Determinants of Health   Financial Resource Strain: Not on file  Food Insecurity: Not on file  Transportation Needs: Not on file  Physical Activity: Not on file  Stress: Not on file  Social Connections: Not on file  Intimate Partner Violence: Not on file    Family History  Problem Relation Age of Onset   Stroke Mother    Hypertension Mother    Cancer Father      Current Outpatient Medications:    acetaminophen (TYLENOL) 325 MG tablet, Take 650 mg by mouth every 6 (six) hours as needed. (Patient not taking: Reported on 01/14/2022), Disp: , Rfl:   CT Angio Abd/Pel W and/or Wo Contrast  Result Date: 01/14/2022 CLINICAL DATA:  A 66 year old female presents for evaluation of GI bleeding. EXAM: CTA ABDOMEN AND PELVIS WITHOUT AND WITH CONTRAST TECHNIQUE: Multidetector CT imaging of the abdomen and pelvis was performed using the standard protocol during bolus administration of intravenous contrast. Multiplanar  reconstructed images and MIPs were obtained and reviewed to evaluate the vascular anatomy. RADIATION DOSE REDUCTION: This exam was performed according to the departmental dose-optimization program which includes automated exposure control, adjustment of the mA and/or kV according to patient size and/or use of iterative reconstruction technique. CONTRAST:  182m OMNIPAQUE IOHEXOL 350 MG/ML SOLN COMPARISON:  Comparison is made with December 15, 2021. FINDINGS: VASCULAR Aorta: Normal caliber aorta without aneurysm, dissection, vasculitis or significant stenosis. Celiac: Widely patent with normal caliber. No signs of dissection or aneurysm. SMA: Patent without evidence of aneurysm, dissection, vasculitis or significant stenosis. Renals: Single renal arteries which are patent without signs of acute process. IMA: Patent without evidence of aneurysm, dissection, vasculitis or significant stenosis. Inflow: Patent without evidence of aneurysm, dissection, vasculitis or significant stenosis. Proximal Outflow: Bilateral common femoral and visualized portions of the superficial and profunda femoral arteries are patent without evidence of aneurysm, dissection, vasculitis or significant stenosis. Veins: Smooth contours of the IVC. Normal caliber of the iliac veins. Review of the MIP images confirms the above findings. NON-VASCULAR Lower chest: The incidental imaging of the lung bases with basilar atelectasis, no effusion or sign of consolidation. Hepatobiliary: Signs of RIGHT hepatic mass (image 22/7) 5.8 x 5.4 as compared to  5.3 x 5.2 cm on the study from January 16th cephalad extension from the mass appears similar. There are no new hepatic lesions. The portal vein is patent. No pericholecystic stranding. Pancreas: Unremarkable. No pancreatic ductal dilatation or surrounding inflammatory changes. Spleen: Unremarkable. Adrenals/Urinary Tract: Adrenal glands are normal. There is symmetric renal enhancement with a small LEFT renal  cyst. No hydronephrosis. No perinephric stranding. No stranding adjacent to the urinary bladder. Stomach/Bowel: No acute small bowel process. Stomach is unremarkable. Small bowel is nondilated. The appendix is normal. Double barrel ostomy in the LEFT lower quadrant, diverting colostomy is again noted. Downstream from the diverting colostomy is a large colonic mass with extra colonic extension and central necrotic debris which is in communication with the colonic lumen. This shows no substantial change compared to imaging from January 16th and there are signs of extensive enhancing tumor that abuts the uterus and extends along the LEFT rectal wall, associated with LEFT pelvic sidewall nodularity near the sacro sciatic notch which is unchanged. No overt signs of active extravasation though this is likely the site of bleeding. A moderate amount of stool, formed stool remains present in the rectum without increase in moderate rectal distension that was seen on the previous exam. Nodular soft tissue changes in the sigmoid mesentery show no change compatible with tumor in this area. Dominant area of the mass measuring up to 6.3 x 4.1 cm as compared to 5.9 x 3.9 cm. There is higher density material within the upstream lumen of the colon which is mildly distended previously collapsed on the prior imaging study. Lymphatic: Normal caliber of the abdominal aorta as outlined above. Signs of mesenteric adenopathy and LEFT pelvic sidewall adenopathy the not changed since previous imaging. Nodule or lymph node at the sacro sciatic notch measuring 14 mm is stable over the short interval. Reproductive: Uterus is inseparable from the pelvic mass that arises from the sigmoid colon. Other: No free air or signs of ascites. Musculoskeletal: Osteopenia. No acute musculoskeletal process or destructive bone finding. IMPRESSION: VASCULAR No acute vascular process such as discrete site of extravasation identified on the current study.  NON-VASCULAR Prominent vessels about a large pelvic mass compatible with known colonic neoplasm with extension beyond the rectal lumen, involvement of adjacent pelvic structures now with upstream dilation and high-density material in the lumen of the diverted colon below the double barrel ostomy. Findings are compatible with tumor related bleeding. Signs of metastatic disease and local invasion. Enlarging colonic neoplasm and increasing size in the short interval of the RIGHT hepatic lobe mass. Electronically Signed   By: Zetta Bills M.D.   On: 01/14/2022 21:27    No images are attached to the encounter.   CMP Latest Ref Rng & Units 01/14/2022  Glucose 70 - 99 mg/dL 178(H)  BUN 8 - 23 mg/dL 14  Creatinine 0.44 - 1.00 mg/dL 0.73  Sodium 135 - 145 mmol/L 137  Potassium 3.5 - 5.1 mmol/L 3.7  Chloride 98 - 111 mmol/L 107  CO2 22 - 32 mmol/L 24  Calcium 8.9 - 10.3 mg/dL 9.0  Total Protein 6.5 - 8.1 g/dL 6.5  Total Bilirubin 0.3 - 1.2 mg/dL 0.5  Alkaline Phos 38 - 126 U/L 72  AST 15 - 41 U/L 20  ALT 0 - 44 U/L 12   CBC Latest Ref Rng & Units 01/17/2022  WBC 4.0 - 10.5 K/uL 3.9(L)  Hemoglobin 12.0 - 15.0 g/dL 8.3(L)  Hematocrit 36.0 - 46.0 % 26.1(L)  Platelets 150 - 400 K/uL  130(L)     Observation/objective:Appears in no acute distress over video visit today.  Breathing is nonlabored  Assessment and plan:Patient is a 66 year old female with metastatic colorectal adenocarcinoma with liver metastases and obstructive rectal lesion s/p diverting palliative colostomy.  She is here for on treatment assessment prior to cycle 15 of palliative FOLFIRI bevacizumab chemotherapy  Counts okay to proceed with cycle 15 of FOLFIRI bevacizumab chemotherapy today with pump disconnect on day 3.Overall tolerating chemotherapy well.  Irinotecan was added recently after there was concern for disease progression on her scan and this is her second irinotecan.  Patient will use as needed Imodium if she has  diarrhea.  Anemia/thrombocytopenia likely secondary to chemotherapy.  Continue to monitor  Follow-up instructions: I will see her back in 2 weeks for cycle 16  I discussed the assessment and treatment plan with the patient. The patient was provided an opportunity to ask questions and all were answered. The patient agreed with the plan and demonstrated an understanding of the instructions.   The patient was advised to call back or seek an in-person evaluation if the symptoms worsen or if the condition fails to improve as anticipated.   Visit Diagnosis: 1. Encounter for antineoplastic chemotherapy   2. Encounter for monoclonal antibody treatment for malignancy     Dr. Randa Evens, MD, MPH [facility] Tel- [phone] 01/18/2022 7:07 PM

## 2022-01-19 ENCOUNTER — Encounter: Payer: Self-pay | Admitting: Gastroenterology

## 2022-01-19 ENCOUNTER — Encounter: Payer: Self-pay | Admitting: Oncology

## 2022-01-21 ENCOUNTER — Ambulatory Visit
Admission: RE | Admit: 2022-01-21 | Discharge: 2022-01-21 | Disposition: A | Discharge status: Home / Self Care | Payer: BC Managed Care – PPO | Source: Ambulatory Visit | Attending: Radiation Oncology | Admitting: Radiation Oncology

## 2022-01-21 ENCOUNTER — Other Ambulatory Visit: Payer: Self-pay

## 2022-01-21 ENCOUNTER — Encounter: Payer: Self-pay | Admitting: Radiation Oncology

## 2022-01-21 VITALS — BP 108/77 | HR 106 | Temp 98.7°F | Resp 18 | Ht 66.0 in | Wt 114.6 lb

## 2022-01-21 DIAGNOSIS — Z9221 Personal history of antineoplastic chemotherapy: Secondary | ICD-10-CM | POA: Insufficient documentation

## 2022-01-21 DIAGNOSIS — Z809 Family history of malignant neoplasm, unspecified: Secondary | ICD-10-CM | POA: Insufficient documentation

## 2022-01-21 DIAGNOSIS — C189 Malignant neoplasm of colon, unspecified: Secondary | ICD-10-CM | POA: Insufficient documentation

## 2022-01-21 DIAGNOSIS — C787 Secondary malignant neoplasm of liver and intrahepatic bile duct: Secondary | ICD-10-CM | POA: Diagnosis not present

## 2022-01-21 DIAGNOSIS — D509 Iron deficiency anemia, unspecified: Secondary | ICD-10-CM | POA: Diagnosis not present

## 2022-01-21 NOTE — Consult Note (Signed)
NEW PATIENT EVALUATION  Name: Alicia Coleman  MRN: [id number]  Date:   01/21/2022     DOB: [birth year]   This 66 y.o. female patient presents to the clinic for initial evaluation of palliative radiation therapy for stage IV rectal cancer.  REFERRING PHYSICIAN: Sindy Guadeloupe, MD  CHIEF COMPLAINT:  Chief Complaint  Patient presents with   Consult    Colon Cancer    DIAGNOSIS: The encounter diagnosis was Metastatic colon cancer in female [facility]).   PREVIOUS INVESTIGATIONS:  CT scans reviewed Pathology report reviewed Clinical notes reviewed  HPI: Patient is a 66 year old female seen in the ER for severe iron deficiency anemia.  Work-up on CT scan showed a 6.4 cm mass in the right liver another 4 cm mass in the liver.  She also had a pelvic mass with apparent luminal narrowing at the level of the lesion counting for large volume stool proximally.  There are multiple pericolonic and perirectal lymph nodes noted.  The colon mass appeared to be invading posteriorly to the uterus.  Flexible Sigg showed large obstructing mass in the rectosigmoid colon that could not be passed beyond the obstruction.  Biopsies were consistent with adenocarcinoma.  Patient underwent a diverting colostomy in June by Dr. Kirstie Mirza.  She has been on palliative FOLFOX chemotherapy since June 2022.  She was recently admitted with a hemoglobin of 8.5 CT scan with vascular intent showed prominent blood vessels around large pelvic mass with extension but beyond the rectal lumen involvement of the pelvic structures.  She has been bleeding into her colostomy as well as blood per rectum.  She is now referred to radiation oncology for consideration of palliative treatment.  She is otherwise doing well she states she is having seen no blood in the last week in her colostomy.  She is having no abdominal pain or discomfort.  PLANNED TREATMENT REGIMEN: Palliative radiation therapy to her pelvis  PAST MEDICAL HISTORY:  has a past medical  history of Allergy, Anemia, and Cancer ([place]).    PAST SURGICAL HISTORY:  Past Surgical History:  Procedure Laterality Date   COLONOSCOPY WITH PROPOFOL N/A 01/16/2022   Procedure: COLONOSCOPY WITH PROPOFOL;  Surgeon: [name], MD;  Location: [facility] ENDOSCOPY;  Service: Gastroenterology;  Laterality: N/A;   ESOPHAGOGASTRODUODENOSCOPY  01/16/2022   Procedure: ESOPHAGOGASTRODUODENOSCOPY (EGD);  Surgeon: [name], MD;  Location: [facility] ENDOSCOPY;  Service: Gastroenterology;;   Beryle Quant N/A 04/24/2021   Procedure: Beryle Quant;  Surgeon: [name], MD;  Location: [facility] ENDOSCOPY;  Service: Gastroenterology;  Laterality: N/A;   IR CV LINE INJECTION  11/13/2021   IR IMAGING GUIDED PORT INSERTION  11/19/2021   IR REMOVAL TUN ACCESS W/ PORT W/O FL MOD SED  11/19/2021   PORTACATH PLACEMENT N/A 05/01/2021   Procedure: INSERTION PORT-A-CATH;  Surgeon: Jules Husbands, MD;  Location: [facility] ORS;  Service: General;  Laterality: N/A;    FAMILY HISTORY: family history includes Cancer in her father; Hypertension in her mother; Stroke in her mother.  SOCIAL HISTORY:  reports that she has never smoked. She has never used smokeless tobacco. She reports that she does not currently use alcohol. She reports that she does not use drugs.  ALLERGIES: Oxaliplatin  MEDICATIONS:  Current Outpatient Medications  Medication Sig Dispense Refill   acetaminophen (TYLENOL) 325 MG tablet Take 650 mg by mouth every 6 (six) hours as needed. (Patient not taking: Reported on 01/14/2022)     No current facility-administered medications for this encounter.    ECOG PERFORMANCE  STATUS:  1 - Symptomatic but completely ambulatory  REVIEW OF SYSTEMS: Patient denies any weight loss, fatigue, weakness, fever, chills or night sweats. Patient denies any loss of vision, blurred vision. Patient denies any ringing  of the ears or hearing loss. No irregular heartbeat. Patient denies heart murmur or history of fainting.  Patient denies any chest pain or pain radiating to her upper extremities. Patient denies any shortness of breath, difficulty breathing at night, cough or hemoptysis. Patient denies any swelling in the lower legs. Patient denies any nausea vomiting, vomiting of blood, or coffee ground material in the vomitus. Patient denies any stomach pain. Patient states has had normal bowel movements no significant constipation or diarrhea. Patient denies any dysuria, hematuria or significant nocturia. Patient denies any problems walking, swelling in the joints or loss of balance. Patient denies any skin changes, loss of hair or loss of weight. Patient denies any excessive worrying or anxiety or significant depression. Patient denies any problems with insomnia. Patient denies excessive thirst, polyuria, polydipsia. Patient denies any swollen glands, patient denies easy bruising or easy bleeding. Patient denies any recent infections, allergies or URI. Patient "s visual fields have not changed significantly in recent time.   PHYSICAL EXAM: BP 108/77    Pulse (!) 106    Temp 98.7 F (37.1 C)    Resp 18    Ht 5\' 6"  (1.676 m)    Wt 114 lb 9.6 oz (52 kg)    LMP  (LMP Unknown)    BMI 18.50 kg/m  Patient has a functioning colostomy.  Well-developed well-nourished patient in NAD. HEENT reveals PERLA, EOMI, discs not visualized.  Oral cavity is clear. No oral mucosal lesions are identified. Neck is clear without evidence of cervical or supraclavicular adenopathy. Lungs are clear to A&P. Cardiac examination is essentially unremarkable with regular rate and rhythm without murmur rub or thrill. Abdomen is benign with no organomegaly or masses noted. Motor sensory and DTR levels are equal and symmetric in the upper and lower extremities. Cranial nerves II through XII are grossly intact. Proprioception is intact. No peripheral adenopathy or edema is identified. No motor or sensory levels are noted. Crude visual fields are within normal  range.  LABORATORY DATA: Pathology reports reviewed    RADIOLOGY RESULTS: CT scans reviewed compatible with above-stated findings   IMPRESSION: Stage IV rectal cancer with prominent bleeding secondary to tumor involvement in her pelvis and 66 year old female  PLAN: This time like to start palliative radiation therapy we will treat her whole pelvis to 45 Gray in 25 fractions.  I have assured her the bleeding will stop after the first week of treatment.  Risks and benefits of treatment including possible diarrhea fatigue increased lower urinary tract symptoms skin reaction alteration of blood counts all were discussed in detail with the patient.  I have personally set up and ordered CT simulation for tomorrow and will start her treatments by the first thing of next week.  Patient comprehends my recommendations well.  I would like to take this opportunity to thank you for allowing me to participate in the care of your patient.Noreene Filbert, MD

## 2022-01-22 ENCOUNTER — Encounter: Payer: Self-pay | Admitting: *Deleted

## 2022-01-22 ENCOUNTER — Ambulatory Visit
Admission: RE | Admit: 2022-01-22 | Discharge: 2022-01-22 | Disposition: A | Discharge status: Home / Self Care | Payer: BC Managed Care – PPO | Source: Ambulatory Visit | Attending: Radiation Oncology | Admitting: Radiation Oncology

## 2022-01-22 DIAGNOSIS — C7989 Secondary malignant neoplasm of other specified sites: Secondary | ICD-10-CM | POA: Insufficient documentation

## 2022-01-22 DIAGNOSIS — Z510 Encounter for antineoplastic radiation therapy: Secondary | ICD-10-CM | POA: Insufficient documentation

## 2022-01-22 DIAGNOSIS — C20 Malignant neoplasm of rectum: Secondary | ICD-10-CM | POA: Diagnosis present

## 2022-01-23 DIAGNOSIS — Z510 Encounter for antineoplastic radiation therapy: Secondary | ICD-10-CM | POA: Diagnosis not present

## 2022-01-26 ENCOUNTER — Telehealth: Payer: Self-pay | Admitting: *Deleted

## 2022-01-26 ENCOUNTER — Encounter: Payer: Self-pay | Admitting: *Deleted

## 2022-01-26 ENCOUNTER — Ambulatory Visit
Admission: RE | Admit: 2022-01-26 | Discharge: 2022-01-26 | Disposition: A | Discharge status: Home / Self Care | Payer: BC Managed Care – PPO | Source: Ambulatory Visit | Attending: Radiation Oncology | Admitting: Radiation Oncology

## 2022-01-26 DIAGNOSIS — Z510 Encounter for antineoplastic radiation therapy: Secondary | ICD-10-CM | POA: Diagnosis not present

## 2022-01-27 ENCOUNTER — Ambulatory Visit
Admission: RE | Admit: 2022-01-27 | Discharge: 2022-01-27 | Disposition: A | Discharge status: Home / Self Care | Payer: BC Managed Care – PPO | Source: Ambulatory Visit | Attending: Radiation Oncology | Admitting: Radiation Oncology

## 2022-01-27 DIAGNOSIS — Z510 Encounter for antineoplastic radiation therapy: Secondary | ICD-10-CM | POA: Diagnosis not present

## 2022-01-27 NOTE — Telephone Encounter (Signed)
Spoke with patient 2/27-confirmed with patient that she will go back to work on 01/31/22 (she works most weekends). She is working but needs fmla/disability intermittent leave to document for radiation and chemotherapy treatments. She is working without limitations except the lifting/pushing/pulling restrictions due to her port a cath. Fmla/disability forms completed. Pending Dr. Elroy Channel signature.

## 2022-01-28 ENCOUNTER — Inpatient Hospital Stay: Payer: BC Managed Care – PPO

## 2022-01-28 ENCOUNTER — Inpatient Hospital Stay: Payer: BC Managed Care – PPO | Admitting: Oncology

## 2022-01-28 ENCOUNTER — Telehealth: Payer: Self-pay | Admitting: *Deleted

## 2022-01-28 ENCOUNTER — Ambulatory Visit
Admission: RE | Admit: 2022-01-28 | Discharge: 2022-01-28 | Disposition: A | Discharge status: Home / Self Care | Payer: BC Managed Care – PPO | Source: Ambulatory Visit | Attending: Radiation Oncology | Admitting: Radiation Oncology

## 2022-01-28 ENCOUNTER — Encounter: Payer: Self-pay | Admitting: Oncology

## 2022-01-28 DIAGNOSIS — Z510 Encounter for antineoplastic radiation therapy: Secondary | ICD-10-CM | POA: Diagnosis present

## 2022-01-28 DIAGNOSIS — C7989 Secondary malignant neoplasm of other specified sites: Secondary | ICD-10-CM | POA: Diagnosis present

## 2022-01-28 DIAGNOSIS — C20 Malignant neoplasm of rectum: Secondary | ICD-10-CM | POA: Diagnosis present

## 2022-01-28 NOTE — Telephone Encounter (Signed)
fmla forms faxed to [place] on 01/27/22. A fax confirmation was received.  ?Mychart msg sent to patient to update her on the status of this form. ?

## 2022-01-28 NOTE — Telephone Encounter (Signed)
Per v/o Dr. Janese Banks, attempted to reach patient at 930 am today. Patient no showed to her chemotherapy apts today. Unable to leave vm on her cell number as mailbox is full. Contacted Home line and left a detailed vm that we were calling to check on her as she has not yet shown up for her treatment apt. I asked the patient to call our office back. ?

## 2022-01-29 ENCOUNTER — Ambulatory Visit
Admission: RE | Admit: 2022-01-29 | Discharge: 2022-01-29 | Disposition: A | Discharge status: Home / Self Care | Payer: BC Managed Care – PPO | Source: Ambulatory Visit | Attending: Radiation Oncology | Admitting: Radiation Oncology

## 2022-01-29 DIAGNOSIS — Z510 Encounter for antineoplastic radiation therapy: Secondary | ICD-10-CM | POA: Diagnosis not present

## 2022-01-30 ENCOUNTER — Ambulatory Visit
Admission: RE | Admit: 2022-01-30 | Discharge: 2022-01-30 | Disposition: A | Discharge status: Home / Self Care | Payer: BC Managed Care – PPO | Source: Ambulatory Visit | Attending: Radiation Oncology | Admitting: Radiation Oncology

## 2022-01-30 DIAGNOSIS — Z510 Encounter for antineoplastic radiation therapy: Secondary | ICD-10-CM | POA: Diagnosis not present

## 2022-02-02 ENCOUNTER — Inpatient Hospital Stay: Payer: BC Managed Care – PPO | Admitting: Oncology

## 2022-02-02 ENCOUNTER — Telehealth: Payer: Self-pay | Admitting: *Deleted

## 2022-02-02 ENCOUNTER — Ambulatory Visit
Admission: RE | Admit: 2022-02-02 | Discharge: 2022-02-02 | Disposition: A | Discharge status: Home / Self Care | Payer: BC Managed Care – PPO | Source: Ambulatory Visit | Attending: Radiation Oncology | Admitting: Radiation Oncology

## 2022-02-02 ENCOUNTER — Inpatient Hospital Stay: Payer: BC Managed Care – PPO

## 2022-02-02 DIAGNOSIS — Z510 Encounter for antineoplastic radiation therapy: Secondary | ICD-10-CM | POA: Diagnosis not present

## 2022-02-02 NOTE — Telephone Encounter (Signed)
Incoming fax received - pt needs her fmla return date adjusted to 02/07/22. ?Form completed- pending Dr. Elroy Channel signature ?

## 2022-02-03 ENCOUNTER — Encounter: Payer: Self-pay | Admitting: Oncology

## 2022-02-03 ENCOUNTER — Other Ambulatory Visit: Payer: Self-pay

## 2022-02-03 ENCOUNTER — Inpatient Hospital Stay: Payer: BC Managed Care – PPO

## 2022-02-03 ENCOUNTER — Ambulatory Visit
Admission: RE | Admit: 2022-02-03 | Discharge: 2022-02-03 | Disposition: A | Discharge status: Home / Self Care | Payer: BC Managed Care – PPO | Source: Ambulatory Visit | Attending: Radiation Oncology | Admitting: Radiation Oncology

## 2022-02-03 ENCOUNTER — Inpatient Hospital Stay (HOSPITAL_BASED_OUTPATIENT_CLINIC_OR_DEPARTMENT_OTHER): Payer: BC Managed Care – PPO | Admitting: Oncology

## 2022-02-03 VITALS — BP 94/66 | HR 91 | Temp 96.7°F | Resp 16 | Wt 110.3 lb

## 2022-02-03 DIAGNOSIS — Z510 Encounter for antineoplastic radiation therapy: Secondary | ICD-10-CM | POA: Diagnosis not present

## 2022-02-03 DIAGNOSIS — Z5112 Encounter for antineoplastic immunotherapy: Secondary | ICD-10-CM

## 2022-02-03 DIAGNOSIS — Z452 Encounter for adjustment and management of vascular access device: Secondary | ICD-10-CM | POA: Insufficient documentation

## 2022-02-03 DIAGNOSIS — Z5111 Encounter for antineoplastic chemotherapy: Secondary | ICD-10-CM | POA: Insufficient documentation

## 2022-02-03 DIAGNOSIS — Z5189 Encounter for other specified aftercare: Secondary | ICD-10-CM | POA: Insufficient documentation

## 2022-02-03 DIAGNOSIS — C787 Secondary malignant neoplasm of liver and intrahepatic bile duct: Secondary | ICD-10-CM | POA: Insufficient documentation

## 2022-02-03 DIAGNOSIS — C19 Malignant neoplasm of rectosigmoid junction: Secondary | ICD-10-CM | POA: Insufficient documentation

## 2022-02-03 DIAGNOSIS — C189 Malignant neoplasm of colon, unspecified: Secondary | ICD-10-CM

## 2022-02-03 LAB — CBC WITH DIFFERENTIAL/PLATELET
Abs Immature Granulocytes: 0.04 10*3/uL (ref 0.00–0.07)
Basophils Absolute: 0 10*3/uL (ref 0.0–0.1)
Basophils Relative: 1 %
Eosinophils Absolute: 0.2 10*3/uL (ref 0.0–0.5)
Eosinophils Relative: 4 %
HCT: 30.3 % — ABNORMAL LOW (ref 36.0–46.0)
Hemoglobin: 9.8 g/dL — ABNORMAL LOW (ref 12.0–15.0)
Immature Granulocytes: 1 %
Lymphocytes Relative: 13 %
Lymphs Abs: 0.5 10*3/uL — ABNORMAL LOW (ref 0.7–4.0)
MCH: 29.3 pg (ref 26.0–34.0)
MCHC: 32.3 g/dL (ref 30.0–36.0)
MCV: 90.4 fL (ref 80.0–100.0)
Monocytes Absolute: 0.7 10*3/uL (ref 0.1–1.0)
Monocytes Relative: 19 %
Neutro Abs: 2.5 10*3/uL (ref 1.7–7.7)
Neutrophils Relative %: 62 %
Platelets: 171 10*3/uL (ref 150–400)
RBC: 3.35 MIL/uL — ABNORMAL LOW (ref 3.87–5.11)
RDW: 14 % (ref 11.5–15.5)
Smear Review: NORMAL
WBC: 4 10*3/uL (ref 4.0–10.5)
nRBC: 0 % (ref 0.0–0.2)

## 2022-02-03 LAB — COMPREHENSIVE METABOLIC PANEL
ALT: 10 U/L (ref 0–44)
AST: 18 U/L (ref 15–41)
Albumin: 2.8 g/dL — ABNORMAL LOW (ref 3.5–5.0)
Alkaline Phosphatase: 77 U/L (ref 38–126)
Anion gap: 5 (ref 5–15)
BUN: 10 mg/dL (ref 8–23)
CO2: 27 mmol/L (ref 22–32)
Calcium: 8.6 mg/dL — ABNORMAL LOW (ref 8.9–10.3)
Chloride: 99 mmol/L (ref 98–111)
Creatinine, Ser: 0.48 mg/dL (ref 0.44–1.00)
GFR, Estimated: >60 mL/min (ref >60)
Glucose, Bld: 99 mg/dL (ref 70–99)
Potassium: 3 mmol/L — ABNORMAL LOW (ref 3.5–5.1)
Sodium: 131 mmol/L — ABNORMAL LOW (ref 135–145)
Total Bilirubin: 0.5 mg/dL (ref 0.3–1.2)
Total Protein: 6.7 g/dL (ref 6.5–8.1)

## 2022-02-03 MED ORDER — FLUOROURACIL CHEMO INJECTION 2.5 GM/50ML
600.0000 mg | Freq: Once | INTRAVENOUS | Status: AC
  Administered 2022-02-03: 600 mg via INTRAVENOUS
  Filled 2022-02-03: qty 12

## 2022-02-03 MED ORDER — SODIUM CHLORIDE 0.9 % IV SOLN
10.0000 mg | Freq: Once | INTRAVENOUS | Status: AC
  Administered 2022-02-03: 10 mg via INTRAVENOUS
  Filled 2022-02-03: qty 10

## 2022-02-03 MED ORDER — ATROPINE SULFATE 1 MG/ML IV SOLN
0.5000 mg | Freq: Once | INTRAVENOUS | Status: AC
  Administered 2022-02-03: 0.5 mg via INTRAVENOUS
  Filled 2022-02-03: qty 1

## 2022-02-03 MED ORDER — SODIUM CHLORIDE 0.9 % IV SOLN
600.0000 mg | Freq: Once | INTRAVENOUS | Status: AC
  Administered 2022-02-03: 600 mg via INTRAVENOUS
  Filled 2022-02-03 (×2): qty 30

## 2022-02-03 MED ORDER — SODIUM CHLORIDE 0.9 % IV SOLN
Freq: Once | INTRAVENOUS | Status: AC
  Filled 2022-02-03: qty 250

## 2022-02-03 MED ORDER — SODIUM CHLORIDE 0.9 % IV SOLN
230.0000 mg | Freq: Once | INTRAVENOUS | Status: AC
  Administered 2022-02-03: 240 mg via INTRAVENOUS
  Filled 2022-02-03: qty 10

## 2022-02-03 MED ORDER — SODIUM CHLORIDE 0.9 % IV SOLN
3600.0000 mg | INTRAVENOUS | Status: DC
  Administered 2022-02-03: 3600 mg via INTRAVENOUS
  Filled 2022-02-03: qty 72

## 2022-02-03 MED ORDER — POTASSIUM CHLORIDE IN NACL 20-0.9 MEQ/L-% IV SOLN
Freq: Once | INTRAVENOUS | Status: AC
  Filled 2022-02-03: qty 1000

## 2022-02-03 NOTE — Progress Notes (Signed)
Hematology/Oncology Consult note [facility]  [phone] [phone]  Patient Care Team: Pcp, No as PCP - General Clent Jacks, RN as Oncology Nurse Navigator Sindy Guadeloupe, MD as Consulting Physician (Oncology) [name], MD as Consulting Physician (Gastroenterology) Jules Husbands, MD as Consulting Physician (General Surgery)   Name of the patient: Alicia Coleman  [id number]  1956-05-08   Date of visit: 02/03/22  Diagnosis- metastatic colon cancer with liver metastases  Chief complaint/ Reason for visit-on treatment assessment prior to cycle 16 of palliative FOLFIRI chemotherapy  Heme/Onc history: Patient is a 66 year old female who was referred from the ER for severe iron deficiency anemia.  She received IV Venofer for that.  I was concerned about malignancy given the degree of anemia and therefore obtain CT abdomen and pelvis with contrast CT showed 6.4 cm enhancing mass in the right liver and another mass measuring 4 cm.  Large volume stool in the right colon transverse and descending colon with irregular mass lesion in the distal sigmoid colon extending into the rectosigmoid junction with lateral extracolonic extension into the pelvic sidewall.  Apparent luminal narrowing at the level of the lesion which accounts for large volume of stool proximally.  Multiple pericolonic and perirectal lymph nodes.  Multiple peritoneal nodules are seen in the lower right pelvic sidewall.  Colon mass appears to be invading posterior uterus.Thrombosis of the superior rectal vein may be bland thrombus although direct tumor invasion not excluded.   Patient had a flexible sigmoidoscopy which showed a large obstructing mass in the rectosigmoid colon.  Scope could not be traversed beyond the obstruction.  Biopsies were taken and were consistent with adenocarcinoma. Patient had a diverting colostomy on 05/01/2021 with Dr. Dahlia Byes   Patient started palliative  FOLFOX chemotherapy in June 2022 with plans to add Avastin after 6 cycles of treatment.  Patient developed allergic reaction to oxaliplatin with cycle 10 as evidenced by diaphoresis vomiting and flushing.  Plan is to hold off on further doses of oxaliplatin.  She has K-ras mutation positive   Progression noted on scans in January 2023.  Plan to switch to FOLFIRI bevacizumab  Interval history-patient is tolerating palliative radiation treatment to her pelvis well.  Denies any bleeding into her colostomy bag.She has lost 15 pounds in the last month  ECOG PS- 1 Pain scale- 0   Review of systems- Review of Systems  Constitutional:  Positive for malaise/fatigue and weight loss. Negative for chills and fever.  HENT:  Negative for congestion, ear discharge and nosebleeds.   Eyes:  Negative for blurred vision.  Respiratory:  Negative for cough, hemoptysis, sputum production, shortness of breath and wheezing.   Cardiovascular:  Negative for chest pain, palpitations, orthopnea and claudication.  Gastrointestinal:  Negative for abdominal pain, blood in stool, constipation, diarrhea, heartburn, melena, nausea and vomiting.  Genitourinary:  Negative for dysuria, flank pain, frequency, hematuria and urgency.  Musculoskeletal:  Negative for back pain, joint pain and myalgias.  Skin:  Negative for rash.  Neurological:  Negative for dizziness, tingling, focal weakness, seizures, weakness and headaches.  Endo/Heme/Allergies:  Does not bruise/bleed easily.  Psychiatric/Behavioral:  Negative for depression and suicidal ideas. The patient does not have insomnia.       Allergies  Allergen Reactions   Oxaliplatin Nausea And Vomiting     Past Medical History:  Diagnosis Date   Allergy    Anemia    Cancer ([place])    Colon cancer ([place])  GI bleed      Past Surgical History:  Procedure Laterality Date   COLONOSCOPY WITH PROPOFOL N/A 01/16/2022   Procedure: COLONOSCOPY WITH PROPOFOL;  Surgeon: [name], MD;  Location: [facility] ENDOSCOPY;  Service: Gastroenterology;  Laterality: N/A;   ESOPHAGOGASTRODUODENOSCOPY  01/16/2022   Procedure: ESOPHAGOGASTRODUODENOSCOPY (EGD);  Surgeon: [name], MD;  Location: [facility] ENDOSCOPY;  Service: Gastroenterology;;   Beryle Quant N/A 04/24/2021   Procedure: Beryle Quant;  Surgeon: [name], MD;  Location: [facility] ENDOSCOPY;  Service: Gastroenterology;  Laterality: N/A;   IR CV LINE INJECTION  11/13/2021   IR IMAGING GUIDED PORT INSERTION  11/19/2021   IR REMOVAL TUN ACCESS W/ PORT W/O FL MOD SED  11/19/2021   PORTACATH PLACEMENT N/A 05/01/2021   Procedure: INSERTION PORT-A-CATH;  Surgeon: Jules Husbands, MD;  Location: [facility] ORS;  Service: General;  Laterality: N/A;    Social History   Socioeconomic History   Marital status: Married    Spouse name: Not on file   Number of children: Not on file   Years of education: Not on file   Highest education level: Not on file  Occupational History   Not on file  Tobacco Use   Smoking status: Never   Smokeless tobacco: Never  Vaping Use   Vaping Use: Never used  Substance and Sexual Activity   Alcohol use: Not Currently   Drug use: Never   Sexual activity: Not Currently  Other Topics Concern   Not on file  Social History Narrative   Not on file   Social Determinants of Health   Financial Resource Strain: Not on file  Food Insecurity: Not on file  Transportation Needs: Not on file  Physical Activity: Not on file  Stress: Not on file  Social Connections: Not on file  Intimate Partner Violence: Not on file    Family History  Problem Relation Age of Onset   Stroke Mother    Hypertension Mother    Cancer Father      Current Outpatient Medications:    acetaminophen (TYLENOL) 325 MG tablet, Take 650 mg by mouth every 6 (six) hours as needed. (Patient not taking: Reported on 01/14/2022), Disp: , Rfl:  No current facility-administered medications for this  visit.  Facility-Administered Medications Ordered in Other Visits:    fluorouracil (ADRUCIL) 3,600 mg in sodium chloride 0.9 % 78 mL chemo infusion, 3,600 mg, Intravenous, 1 day or 1 dose, Sindy Guadeloupe, MD   fluorouracil (ADRUCIL) chemo injection 600 mg, 600 mg, Intravenous, Once, Sindy Guadeloupe, MD   irinotecan (CAMPTOSAR) 240 mg in sodium chloride 0.9 % 500 mL chemo infusion, 240 mg, Intravenous, Once, Sindy Guadeloupe, MD, Last Rate: 341 mL/hr at 02/03/22 1110, 240 mg at 02/03/22 1110   leucovorin 600 mg in sodium chloride 0.9 % 250 mL infusion, 600 mg, Intravenous, Once, Sindy Guadeloupe, MD, Last Rate: 187 mL/hr at 02/03/22 1108, 600 mg at 02/03/22 1108  Physical exam:  Vitals:   02/03/22 0900  BP: 94/66  Pulse: 91  Resp: 16  Temp: (!) 96.7 F (35.9 C)  SpO2: 98%  Weight: 110 lb 4.8 oz (50 kg)   Physical Exam Constitutional:      General: She is not in acute distress. Cardiovascular:     Rate and Rhythm: Normal rate and regular rhythm.     Heart sounds: Normal heart sounds.  Pulmonary:     Effort: Pulmonary effort is normal.     Breath sounds: Normal breath sounds.  Abdominal:     General: Bowel sounds are normal.     Palpations: Abdomen is soft.     Comments: Colostomy in place  Skin:    General: Skin is warm and dry.  Neurological:     Mental Status: She is alert and oriented to person, place, and time.     CMP Latest Ref Rng & Units 02/03/2022  Glucose 70 - 99 mg/dL 99  BUN 8 - 23 mg/dL 10  Creatinine 0.44 - 1.00 mg/dL 0.48  Sodium 135 - 145 mmol/L 131(L)  Potassium 3.5 - 5.1 mmol/L 3.0(L)  Chloride 98 - 111 mmol/L 99  CO2 22 - 32 mmol/L 27  Calcium 8.9 - 10.3 mg/dL 8.6(L)  Total Protein 6.5 - 8.1 g/dL 6.7  Total Bilirubin 0.3 - 1.2 mg/dL 0.5  Alkaline Phos 38 - 126 U/L 77  AST 15 - 41 U/L 18  ALT 0 - 44 U/L 10   CBC Latest Ref Rng & Units 02/03/2022  WBC 4.0 - 10.5 K/uL 4.0  Hemoglobin 12.0 - 15.0 g/dL 9.8(L)  Hematocrit 36.0 - 46.0 % 30.3(L)  Platelets 150  - 400 K/uL 171    No images are attached to the encounter.  CT Angio Abd/Pel W and/or Wo Contrast  Result Date: 01/14/2022 CLINICAL DATA:  A 66 year old female presents for evaluation of GI bleeding. EXAM: CTA ABDOMEN AND PELVIS WITHOUT AND WITH CONTRAST TECHNIQUE: Multidetector CT imaging of the abdomen and pelvis was performed using the standard protocol during bolus administration of intravenous contrast. Multiplanar reconstructed images and MIPs were obtained and reviewed to evaluate the vascular anatomy. RADIATION DOSE REDUCTION: This exam was performed according to the departmental dose-optimization program which includes automated exposure control, adjustment of the mA and/or kV according to patient size and/or use of iterative reconstruction technique. CONTRAST:  129m OMNIPAQUE IOHEXOL 350 MG/ML SOLN COMPARISON:  Comparison is made with December 15, 2021. FINDINGS: VASCULAR Aorta: Normal caliber aorta without aneurysm, dissection, vasculitis or significant stenosis. Celiac: Widely patent with normal caliber. No signs of dissection or aneurysm. SMA: Patent without evidence of aneurysm, dissection, vasculitis or significant stenosis. Renals: Single renal arteries which are patent without signs of acute process. IMA: Patent without evidence of aneurysm, dissection, vasculitis or significant stenosis. Inflow: Patent without evidence of aneurysm, dissection, vasculitis or significant stenosis. Proximal Outflow: Bilateral common femoral and visualized portions of the superficial and profunda femoral arteries are patent without evidence of aneurysm, dissection, vasculitis or significant stenosis. Veins: Smooth contours of the IVC. Normal caliber of the iliac veins. Review of the MIP images confirms the above findings. NON-VASCULAR Lower chest: The incidental imaging of the lung bases with basilar atelectasis, no effusion or sign of consolidation. Hepatobiliary: Signs of RIGHT hepatic mass (image 22/7) 5.8 x  5.4 as compared to 5.3 x 5.2 cm on the study from January 16th cephalad extension from the mass appears similar. There are no new hepatic lesions. The portal vein is patent. No pericholecystic stranding. Pancreas: Unremarkable. No pancreatic ductal dilatation or surrounding inflammatory changes. Spleen: Unremarkable. Adrenals/Urinary Tract: Adrenal glands are normal. There is symmetric renal enhancement with a small LEFT renal cyst. No hydronephrosis. No perinephric stranding. No stranding adjacent to the urinary bladder. Stomach/Bowel: No acute small bowel process. Stomach is unremarkable. Small bowel is nondilated. The appendix is normal. Double barrel ostomy in the LEFT lower quadrant, diverting colostomy is again noted. Downstream from the diverting colostomy is a large colonic mass with extra colonic extension and central necrotic debris which is  in communication with the colonic lumen. This shows no substantial change compared to imaging from January 16th and there are signs of extensive enhancing tumor that abuts the uterus and extends along the LEFT rectal wall, associated with LEFT pelvic sidewall nodularity near the sacro sciatic notch which is unchanged. No overt signs of active extravasation though this is likely the site of bleeding. A moderate amount of stool, formed stool remains present in the rectum without increase in moderate rectal distension that was seen on the previous exam. Nodular soft tissue changes in the sigmoid mesentery show no change compatible with tumor in this area. Dominant area of the mass measuring up to 6.3 x 4.1 cm as compared to 5.9 x 3.9 cm. There is higher density material within the upstream lumen of the colon which is mildly distended previously collapsed on the prior imaging study. Lymphatic: Normal caliber of the abdominal aorta as outlined above. Signs of mesenteric adenopathy and LEFT pelvic sidewall adenopathy the not changed since previous imaging. Nodule or lymph  node at the sacro sciatic notch measuring 14 mm is stable over the short interval. Reproductive: Uterus is inseparable from the pelvic mass that arises from the sigmoid colon. Other: No free air or signs of ascites. Musculoskeletal: Osteopenia. No acute musculoskeletal process or destructive bone finding. IMPRESSION: VASCULAR No acute vascular process such as discrete site of extravasation identified on the current study. NON-VASCULAR Prominent vessels about a large pelvic mass compatible with known colonic neoplasm with extension beyond the rectal lumen, involvement of adjacent pelvic structures now with upstream dilation and high-density material in the lumen of the diverted colon below the double barrel ostomy. Findings are compatible with tumor related bleeding. Signs of metastatic disease and local invasion. Enlarging colonic neoplasm and increasing size in the short interval of the RIGHT hepatic lobe mass. Electronically Signed   By: Zetta Bills M.D.   On: 01/14/2022 21:27     Assessment and plan- Patient is a 66 y.o. female female with metastatic colorectal adenocarcinoma with liver metastases and obstructive rectal lesion s/p diverting palliative colostomy.  She is here for on treatment assessment prior to cycle 16 of palliative FOLFIRI chemotherapy  Patient is currently undergoing palliative radiation after she was found to have local tumor progression around her colostomy site causing some bleeding.  She has not had any bleeding since the start of radiation treatment.  Counts are otherwise okay to proceed with cycle 16 of FOLFIRI chemotherapy today with pump disconnect on day 3.  I will see her back in 2 weeks for cycle 17.    Given her onset of recent bleeding we are holding off on giving her a Avastin at this time.  We will consider restarting down the line in a few weeks time.  Normocytic anemia: Overall stable between 9-10.  Continue to monitor   Visit Diagnosis 1. Metastatic colon  cancer in female [facility])   2. Encounter for antineoplastic chemotherapy   3. Encounter for monoclonal antibody treatment for malignancy      Dr. Randa Evens, MD, MPH [facility] at [facility] [phone] 02/03/2022 12:20 PM

## 2022-02-03 NOTE — Patient Instructions (Signed)
[facility] CANCER CTR AT [place]  Discharge Instructions: ?Thank you for choosing [facility] to provide your oncology and hematology care.  ?If you have a lab appointment with the [facility], please go directly to the [facility] and check in at the registration area. ? ?Wear comfortable clothing and clothing appropriate for easy access to any Portacath or PICC line.  ? ?We strive to give you quality time with your provider. You may need to reschedule your appointment if you arrive late (15 or more minutes).  Arriving late affects you and other patients whose appointments are after yours.  Also, if you miss three or more appointments without notifying the office, you may be dismissed from the clinic at the provider?s discretion.    ?  ?For prescription refill requests, have your pharmacy contact our office and allow 72 hours for refills to be completed.   ? ?Today you received the following chemotherapy and/or immunotherapy agents : Folfiri ?  ?To help prevent nausea and vomiting after your treatment, we encourage you to take your nausea medication as directed. ? ?BELOW ARE SYMPTOMS THAT SHOULD BE REPORTED IMMEDIATELY: ?*FEVER GREATER THAN 100.4 F (38 ?C) OR HIGHER ?*CHILLS OR SWEATING ?*NAUSEA AND VOMITING THAT IS NOT CONTROLLED WITH YOUR NAUSEA MEDICATION ?*UNUSUAL SHORTNESS OF BREATH ?*UNUSUAL BRUISING OR BLEEDING ?*URINARY PROBLEMS (pain or burning when urinating, or frequent urination) ?*BOWEL PROBLEMS (unusual diarrhea, constipation, pain near the anus) ?TENDERNESS IN MOUTH AND THROAT WITH OR WITHOUT PRESENCE OF ULCERS (sore throat, sores in mouth, or a toothache) ?UNUSUAL RASH, SWELLING OR PAIN  ?UNUSUAL VAGINAL DISCHARGE OR ITCHING  ? ?Items with * indicate a potential emergency and should be followed up as soon as possible or go to the Emergency Department if any problems should occur. ? ?Please show the CHEMOTHERAPY ALERT CARD or IMMUNOTHERAPY ALERT CARD at check-in to the  Emergency Department and triage nurse. ? ?Should you have questions after your visit or need to cancel or reschedule your appointment, please contact [facility] CANCER [place] AT [place]  [phone] and follow the prompts.  Office hours are 8:00 a.m. to 4:30 p.m. Monday - Friday. Please note that voicemails left after 4:00 p.m. may not be returned until the following business day.  We are closed weekends and major holidays. You have access to a nurse at all times for urgent questions. Please call the main number to the clinic [phone] and follow the prompts. ? ?For any non-urgent questions, you may also contact your provider using MyChart. We now offer e-Visits for anyone 21 and older to request care online for non-urgent symptoms. For details visit [email or web address]. ?  ?Also download the MyChart app! Go to the app store, search "MyChart", open the app, select [facility], and log in with your MyChart username and password. ? ?Due to Covid, a mask is required upon entering the hospital/clinic. If you do not have a mask, one will be given to you upon arrival. For doctor visits, patients may have 1 support person aged 72 or older with them. For treatment visits, patients cannot have anyone with them due to current Covid guidelines and our immunocompromised population.  ?

## 2022-02-03 NOTE — Progress Notes (Signed)
Constant diarrhea due to radiation. Pt will like to know if she is still able to take imodium. Also request more bags if given imodium, c/o constant cold hands and fatigue. Took a week off of work due to feeling tired. Having less of an appetite. Will like to know if she is able to drink caffeine.  ?

## 2022-02-03 NOTE — Progress Notes (Signed)
Per MD will decrease doses for weight loss. ?

## 2022-02-04 ENCOUNTER — Encounter: Payer: Self-pay | Admitting: Oncology

## 2022-02-04 ENCOUNTER — Inpatient Hospital Stay: Payer: BC Managed Care – PPO

## 2022-02-04 ENCOUNTER — Ambulatory Visit
Admission: RE | Admit: 2022-02-04 | Discharge: 2022-02-04 | Disposition: A | Discharge status: Home / Self Care | Payer: BC Managed Care – PPO | Source: Ambulatory Visit | Attending: Radiation Oncology | Admitting: Radiation Oncology

## 2022-02-04 DIAGNOSIS — Z510 Encounter for antineoplastic radiation therapy: Secondary | ICD-10-CM | POA: Diagnosis not present

## 2022-02-04 NOTE — Telephone Encounter (Signed)
Dr. Janese Banks signed the form. Form faxed back to [facility]. Fax confirmation rcvd. ?

## 2022-02-05 ENCOUNTER — Ambulatory Visit
Admission: RE | Admit: 2022-02-05 | Discharge: 2022-02-05 | Disposition: A | Discharge status: Home / Self Care | Payer: BC Managed Care – PPO | Source: Ambulatory Visit | Attending: Radiation Oncology | Admitting: Radiation Oncology

## 2022-02-05 ENCOUNTER — Other Ambulatory Visit: Payer: Self-pay

## 2022-02-05 ENCOUNTER — Inpatient Hospital Stay: Payer: BC Managed Care – PPO

## 2022-02-05 VITALS — BP 110/77 | HR 84 | Resp 18

## 2022-02-05 DIAGNOSIS — Z510 Encounter for antineoplastic radiation therapy: Secondary | ICD-10-CM | POA: Diagnosis not present

## 2022-02-05 DIAGNOSIS — C189 Malignant neoplasm of colon, unspecified: Secondary | ICD-10-CM

## 2022-02-05 MED ORDER — HEPARIN SOD (PORK) LOCK FLUSH 100 UNIT/ML IV SOLN
500.0000 [IU] | Freq: Once | INTRAVENOUS | Status: AC | PRN
  Administered 2022-02-05: 14:00:00 500 [IU]
  Filled 2022-02-05: qty 5

## 2022-02-05 MED ORDER — SODIUM CHLORIDE 0.9% FLUSH
10.0000 mL | INTRAVENOUS | Status: DC | PRN
  Administered 2022-02-05: 14:00:00 10 mL
  Filled 2022-02-05: qty 10

## 2022-02-06 ENCOUNTER — Ambulatory Visit
Admission: RE | Admit: 2022-02-06 | Discharge: 2022-02-06 | Disposition: A | Discharge status: Home / Self Care | Payer: BC Managed Care – PPO | Source: Ambulatory Visit | Attending: Radiation Oncology | Admitting: Radiation Oncology

## 2022-02-06 DIAGNOSIS — Z510 Encounter for antineoplastic radiation therapy: Secondary | ICD-10-CM | POA: Diagnosis not present

## 2022-02-09 ENCOUNTER — Ambulatory Visit
Admission: RE | Admit: 2022-02-09 | Discharge: 2022-02-09 | Disposition: A | Discharge status: Home / Self Care | Payer: BC Managed Care – PPO | Source: Ambulatory Visit | Attending: Radiation Oncology | Admitting: Radiation Oncology

## 2022-02-09 DIAGNOSIS — Z510 Encounter for antineoplastic radiation therapy: Secondary | ICD-10-CM | POA: Diagnosis not present

## 2022-02-10 ENCOUNTER — Ambulatory Visit
Admission: RE | Admit: 2022-02-10 | Discharge: 2022-02-10 | Disposition: A | Discharge status: Home / Self Care | Payer: BC Managed Care – PPO | Source: Ambulatory Visit | Attending: Radiation Oncology | Admitting: Radiation Oncology

## 2022-02-10 DIAGNOSIS — Z510 Encounter for antineoplastic radiation therapy: Secondary | ICD-10-CM | POA: Diagnosis not present

## 2022-02-11 ENCOUNTER — Ambulatory Visit
Admission: RE | Admit: 2022-02-11 | Discharge: 2022-02-11 | Disposition: A | Discharge status: Home / Self Care | Payer: BC Managed Care – PPO | Source: Ambulatory Visit | Attending: Radiation Oncology | Admitting: Radiation Oncology

## 2022-02-11 DIAGNOSIS — Z510 Encounter for antineoplastic radiation therapy: Secondary | ICD-10-CM | POA: Diagnosis not present

## 2022-02-12 ENCOUNTER — Ambulatory Visit
Admission: RE | Admit: 2022-02-12 | Discharge: 2022-02-12 | Disposition: A | Discharge status: Home / Self Care | Payer: BC Managed Care – PPO | Source: Ambulatory Visit | Attending: Radiation Oncology | Admitting: Radiation Oncology

## 2022-02-12 DIAGNOSIS — Z510 Encounter for antineoplastic radiation therapy: Secondary | ICD-10-CM | POA: Diagnosis not present

## 2022-02-13 ENCOUNTER — Ambulatory Visit
Admission: RE | Admit: 2022-02-13 | Discharge: 2022-02-13 | Disposition: A | Discharge status: Home / Self Care | Payer: BC Managed Care – PPO | Source: Ambulatory Visit | Attending: Radiation Oncology | Admitting: Radiation Oncology

## 2022-02-13 DIAGNOSIS — Z510 Encounter for antineoplastic radiation therapy: Secondary | ICD-10-CM | POA: Diagnosis not present

## 2022-02-16 ENCOUNTER — Ambulatory Visit
Admission: RE | Admit: 2022-02-16 | Discharge: 2022-02-16 | Disposition: A | Discharge status: Home / Self Care | Payer: BC Managed Care – PPO | Source: Ambulatory Visit | Attending: Radiation Oncology | Admitting: Radiation Oncology

## 2022-02-16 DIAGNOSIS — Z510 Encounter for antineoplastic radiation therapy: Secondary | ICD-10-CM | POA: Diagnosis not present

## 2022-02-17 ENCOUNTER — Ambulatory Visit
Admission: RE | Admit: 2022-02-17 | Discharge: 2022-02-17 | Disposition: A | Discharge status: Home / Self Care | Payer: BC Managed Care – PPO | Source: Ambulatory Visit | Attending: Radiation Oncology | Admitting: Radiation Oncology

## 2022-02-17 ENCOUNTER — Encounter: Payer: Self-pay | Admitting: Oncology

## 2022-02-17 ENCOUNTER — Inpatient Hospital Stay: Payer: BC Managed Care – PPO

## 2022-02-17 ENCOUNTER — Other Ambulatory Visit: Payer: Self-pay

## 2022-02-17 ENCOUNTER — Telehealth: Payer: Self-pay | Admitting: *Deleted

## 2022-02-17 ENCOUNTER — Inpatient Hospital Stay (HOSPITAL_BASED_OUTPATIENT_CLINIC_OR_DEPARTMENT_OTHER): Payer: BC Managed Care – PPO | Admitting: Oncology

## 2022-02-17 VITALS — HR 100

## 2022-02-17 VITALS — BP 107/84 | HR 125 | Temp 98.7°F | Resp 20 | Wt 107.7 lb

## 2022-02-17 DIAGNOSIS — Z5111 Encounter for antineoplastic chemotherapy: Secondary | ICD-10-CM

## 2022-02-17 DIAGNOSIS — C189 Malignant neoplasm of colon, unspecified: Secondary | ICD-10-CM

## 2022-02-17 DIAGNOSIS — T451X5A Adverse effect of antineoplastic and immunosuppressive drugs, initial encounter: Secondary | ICD-10-CM

## 2022-02-17 DIAGNOSIS — D701 Agranulocytosis secondary to cancer chemotherapy: Secondary | ICD-10-CM | POA: Diagnosis not present

## 2022-02-17 DIAGNOSIS — E876 Hypokalemia: Secondary | ICD-10-CM

## 2022-02-17 DIAGNOSIS — Z510 Encounter for antineoplastic radiation therapy: Secondary | ICD-10-CM | POA: Diagnosis not present

## 2022-02-17 LAB — COMPREHENSIVE METABOLIC PANEL
ALT: 7 U/L (ref 0–44)
AST: 14 U/L — ABNORMAL LOW (ref 15–41)
Albumin: 2.7 g/dL — ABNORMAL LOW (ref 3.5–5.0)
Alkaline Phosphatase: 73 U/L (ref 38–126)
Anion gap: 12 (ref 5–15)
BUN: 8 mg/dL (ref 8–23)
CO2: 23 mmol/L (ref 22–32)
Calcium: 8.8 mg/dL — ABNORMAL LOW (ref 8.9–10.3)
Chloride: 95 mmol/L — ABNORMAL LOW (ref 98–111)
Creatinine, Ser: 0.44 mg/dL (ref 0.44–1.00)
GFR, Estimated: >60 mL/min (ref >60)
Glucose, Bld: 113 mg/dL — ABNORMAL HIGH (ref 70–99)
Potassium: 2.9 mmol/L — ABNORMAL LOW (ref 3.5–5.1)
Sodium: 130 mmol/L — ABNORMAL LOW (ref 135–145)
Total Bilirubin: 0.5 mg/dL (ref 0.3–1.2)
Total Protein: 6.9 g/dL (ref 6.5–8.1)

## 2022-02-17 LAB — CBC WITH DIFFERENTIAL/PLATELET
Abs Immature Granulocytes: 0.01 10*3/uL (ref 0.00–0.07)
Basophils Absolute: 0 10*3/uL (ref 0.0–0.1)
Basophils Relative: 1 %
Eosinophils Absolute: 0.1 10*3/uL (ref 0.0–0.5)
Eosinophils Relative: 4 %
HCT: 32.7 % — ABNORMAL LOW (ref 36.0–46.0)
Hemoglobin: 10.6 g/dL — ABNORMAL LOW (ref 12.0–15.0)
Immature Granulocytes: 0 %
Lymphocytes Relative: 7 %
Lymphs Abs: 0.2 10*3/uL — ABNORMAL LOW (ref 0.7–4.0)
MCH: 28.9 pg (ref 26.0–34.0)
MCHC: 32.4 g/dL (ref 30.0–36.0)
MCV: 89.1 fL (ref 80.0–100.0)
Monocytes Absolute: 0.7 10*3/uL (ref 0.1–1.0)
Monocytes Relative: 26 %
Neutro Abs: 1.6 10*3/uL — ABNORMAL LOW (ref 1.7–7.7)
Neutrophils Relative %: 62 %
Platelets: 270 10*3/uL (ref 150–400)
RBC: 3.67 MIL/uL — ABNORMAL LOW (ref 3.87–5.11)
RDW: 14.5 % (ref 11.5–15.5)
Smear Review: NORMAL
WBC: 2.5 10*3/uL — ABNORMAL LOW (ref 4.0–10.5)
nRBC: 0 % (ref 0.0–0.2)

## 2022-02-17 MED ORDER — LIDOCAINE-PRILOCAINE 2.5-2.5 % EX CREA: 1.0000 "application " | TOPICAL_CREAM | CUTANEOUS | 4 refills | Status: AC | PRN

## 2022-02-17 MED ORDER — SODIUM CHLORIDE 0.9 % IV SOLN
3600.0000 mg | INTRAVENOUS | Status: DC
  Administered 2022-02-17: 3600 mg via INTRAVENOUS
  Filled 2022-02-17: qty 72

## 2022-02-17 MED ORDER — SODIUM CHLORIDE 0.9 % IV SOLN
600.0000 mg | Freq: Once | INTRAVENOUS | Status: AC
  Administered 2022-02-17: 600 mg via INTRAVENOUS
  Filled 2022-02-17: qty 30

## 2022-02-17 MED ORDER — ATROPINE SULFATE 1 MG/ML IV SOLN
0.5000 mg | Freq: Once | INTRAVENOUS | Status: AC
  Administered 2022-02-17: 0.5 mg via INTRAVENOUS
  Filled 2022-02-17: qty 1

## 2022-02-17 MED ORDER — SODIUM CHLORIDE 0.9 % IV SOLN
10.0000 mg | Freq: Once | INTRAVENOUS | Status: AC
  Administered 2022-02-17: 10 mg via INTRAVENOUS
  Filled 2022-02-17: qty 10

## 2022-02-17 MED ORDER — POTASSIUM CHLORIDE IN NACL 20-0.9 MEQ/L-% IV SOLN
Freq: Once | INTRAVENOUS | Status: AC
  Filled 2022-02-17: qty 1000

## 2022-02-17 MED ORDER — SODIUM CHLORIDE 0.9 % IV SOLN
Freq: Once | INTRAVENOUS | Status: AC
  Filled 2022-02-17: qty 250

## 2022-02-17 MED ORDER — SODIUM CHLORIDE 0.9 % IV SOLN
240.0000 mg | Freq: Once | INTRAVENOUS | Status: AC
  Administered 2022-02-17: 240 mg via INTRAVENOUS
  Filled 2022-02-17: qty 10

## 2022-02-17 MED ORDER — FLUOROURACIL CHEMO INJECTION 2.5 GM/50ML
600.0000 mg | Freq: Once | INTRAVENOUS | Status: AC
  Administered 2022-02-17: 600 mg via INTRAVENOUS
  Filled 2022-02-17: qty 12

## 2022-02-17 NOTE — Progress Notes (Signed)
? ? ? ?Hematology/Oncology Consult note ?[place]  ?[phone] [phone] ? ?Patient Care Team: ?Pcp, No as PCP - General ?Clent Jacks, RN as Oncology Nurse Navigator ?Sindy Guadeloupe, MD as Consulting Physician (Oncology) ?[name], MD as Consulting Physician (Gastroenterology) ?Jules Husbands, MD as Consulting Physician (General Surgery)  ? ?Name of the patient: Alicia Coleman  ?[id number]  ?05/08/1956  ? ?Date of visit: 02/17/22 ? ?Diagnosis- metastatic colon cancer with liver metastases ? ?Chief complaint/ Reason for visit-on treatment assessment prior to cycle 17 of palliative FOLFIRI chemotherapy ? ?Heme/Onc history: Patient is a 66 year old female who was referred from the ER for severe iron deficiency anemia.  She received IV Venofer for that.  I was concerned about malignancy given the degree of anemia and therefore obtain CT abdomen and pelvis with contrast CT showed 6.4 cm enhancing mass in the right liver and another mass measuring 4 cm.  Large volume stool in the right colon transverse and descending colon with irregular mass lesion in the distal sigmoid colon extending into the rectosigmoid junction with lateral extracolonic extension into the pelvic sidewall.  Apparent luminal narrowing at the level of the lesion which accounts for large volume of stool proximally.  Multiple pericolonic and perirectal lymph nodes.  Multiple peritoneal nodules are seen in the lower right pelvic sidewall.  Colon mass appears to be invading posterior uterus.Thrombosis of the superior rectal vein may be bland thrombus although direct tumor invasion not excluded. ?  ?Patient had a flexible sigmoidoscopy which showed a large obstructing mass in the rectosigmoid colon.  Scope could not be traversed beyond the obstruction.  Biopsies were taken and were consistent with adenocarcinoma. Patient had a diverting colostomy on 05/01/2021 with Dr. Dahlia Byes ?  ?Patient started palliative  FOLFOX chemotherapy in June 2022 with plans to add Avastin after 6 cycles of treatment.  Patient developed allergic reaction to oxaliplatin with cycle 10 as evidenced by diaphoresis vomiting and flushing.  Plan is to hold off on further doses of oxaliplatin.  She has K-ras mutation positive ?  ?Progression noted on scans in January 2023.  Plan to switch to FOLFIRI bevacizumab ?  ? ?Interval history-patient reports feeling of gas in her abdomen after eating appetite is fair.  She has lost 3 more pounds in the last 2 weeks.  Denies any bleeding in her colostomy bag around it. ? ?ECOG PS- 1 ?Pain scale- 0 ? ? ?Review of systems- Review of Systems  ?Constitutional:  Positive for malaise/fatigue. Negative for chills, fever and weight loss.  ?HENT:  Negative for congestion, ear discharge and nosebleeds.   ?Eyes:  Negative for blurred vision.  ?Respiratory:  Negative for cough, hemoptysis, sputum production, shortness of breath and wheezing.   ?Cardiovascular:  Negative for chest pain, palpitations, orthopnea and claudication.  ?Gastrointestinal:  Negative for abdominal pain, blood in stool, constipation, diarrhea, heartburn, melena, nausea and vomiting.  ?Genitourinary:  Negative for dysuria, flank pain, frequency, hematuria and urgency.  ?Musculoskeletal:  Negative for back pain, joint pain and myalgias.  ?Skin:  Negative for rash.  ?Neurological:  Negative for dizziness, tingling, focal weakness, seizures, weakness and headaches.  ?Endo/Heme/Allergies:  Does not bruise/bleed easily.  ?Psychiatric/Behavioral:  Negative for depression and suicidal ideas. The patient does not have insomnia.    ? ? ?Allergies  ?Allergen Reactions  ? Oxaliplatin Nausea And Vomiting  ? ? ? ?Past Medical History:  ?Diagnosis Date  ? Allergy   ? Anemia   ? Cancer (  [place])   ? Colon cancer ([place])   ? GI bleed   ? ? ? ?Past Surgical History:  ?Procedure Laterality Date  ? COLONOSCOPY WITH PROPOFOL N/A 01/16/2022  ? Procedure: COLONOSCOPY WITH  PROPOFOL;  Surgeon: [name], MD;  Location[facility] ENDOSCOPY;  Service: Gastroenterology;  Laterality: N/A;  ? ESOPHAGOGASTRODUODENOSCOPY  01/16/2022  ? Procedure: ESOPHAGOGASTRODUODENOSCOPY (EGD);  Surgeon: [name], MD;  Location: [facility] ENDOSCOPY;  Service: Gastroenterology;;  ? [place] N/A 04/24/2021  ? Procedure: FLEXIBLE SIGMOIDOSCOPY;  Surgeon: [name], MD;  Location: [facility] ENDOSCOPY;  Service: Gastroenterology;  Laterality: N/A;  ? IR CV LINE INJECTION  11/13/2021  ? IR IMAGING GUIDED PORT INSERTION  11/19/2021  ? IR REMOVAL TUN ACCESS W/ PORT W/O FL MOD SED  11/19/2021  ? PORTACATH PLACEMENT N/A 05/01/2021  ? Procedure: INSERTION PORT-A-CATH;  Surgeon: Jules Husbands, MD;  Location: [facility] ORS;  Service: General;  Laterality: N/A;  ? ? ?Social History  ? ?Socioeconomic History  ? Marital status: Married  ?  Spouse name: Not on file  ? Number of children: Not on file  ? Years of education: Not on file  ? Highest education level: Not on file  ?Occupational History  ? Not on file  ?Tobacco Use  ? Smoking status: Never  ? Smokeless tobacco: Never  ?Vaping Use  ? Vaping Use: Never used  ?Substance and Sexual Activity  ? Alcohol use: Not Currently  ? Drug use: Never  ? Sexual activity: Not Currently  ?Other Topics Concern  ? Not on file  ?Social History Narrative  ? Not on file  ? ?Social Determinants of Health  ? ?Financial Resource Strain: Not on file  ?Food Insecurity: Not on file  ?Transportation Needs: Not on file  ?Physical Activity: Not on file  ?Stress: Not on file  ?Social Connections: Not on file  ?Intimate Partner Violence: Not on file  ? ? ?Family History  ?Problem Relation Age of Onset  ? Stroke Mother   ? Hypertension Mother   ? Cancer Father   ? ? ? ?Current Outpatient Medications:  ?  acetaminophen (TYLENOL) 325 MG tablet, Take 650 mg by mouth every 6 (six) hours as needed., Disp: , Rfl:  ?  lidocaine-prilocaine (EMLA) cream, Apply 1 application. topically as needed. Apply to port  and cover with saran wrap 1-2 hours prior to port access, Disp: 30 g, Rfl: 4 ?No current facility-administered medications for this visit. ? ?Facility-Administered Medications Ordered in Other Visits:  ?  fluorouracil (ADRUCIL) 3,600 mg in sodium chloride 0.9 % 78 mL chemo infusion, 3,600 mg, Intravenous, 1 day or 1 dose, Sindy Guadeloupe, MD ?  fluorouracil (ADRUCIL) chemo injection 600 mg, 600 mg, Intravenous, Once, Sindy Guadeloupe, MD ?  irinotecan (CAMPTOSAR) 240 mg in sodium chloride 0.9 % 500 mL chemo infusion, 240 mg, Intravenous, Once, Sindy Guadeloupe, MD ?  leucovorin 600 mg in sodium chloride 0.9 % 250 mL infusion, 600 mg, Intravenous, Once, Sindy Guadeloupe, MD ? ?Physical exam:  ?Vitals:  ? 02/17/22 0914  ?BP: 107/84  ?Pulse: (!) 125  ?Resp: 20  ?Temp: 98.7 ?F (37.1 ?C)  ?SpO2: 100%  ?Weight: 107 lb 11.2 oz (48.9 kg)  ? ?Physical Exam ?Constitutional:   ?   General: She is not in acute distress. ?Cardiovascular:  ?   Rate and Rhythm: Regular rhythm. Tachycardia present.  ?   Heart sounds: Normal heart sounds.  ?Pulmonary:  ?   Effort: Pulmonary effort is normal.  ?  Breath sounds: Normal breath sounds.  ?Abdominal:  ?   General: Bowel sounds are normal.  ?   Palpations: Abdomen is soft.  ?Skin: ?   General: Skin is warm and dry.  ?Neurological:  ?   Mental Status: She is alert and oriented to person, place, and time.  ?  ? ?CMP Latest Ref Rng & Units 02/17/2022  ?Glucose 70 - 99 mg/dL 113(H)  ?BUN 8 - 23 mg/dL 8  ?Creatinine 0.44 - 1.00 mg/dL 0.44  ?Sodium 135 - 145 mmol/L 130(L)  ?Potassium 3.5 - 5.1 mmol/L 2.9(L)  ?Chloride 98 - 111 mmol/L 95(L)  ?CO2 22 - 32 mmol/L 23  ?Calcium 8.9 - 10.3 mg/dL 8.8(L)  ?Total Protein 6.5 - 8.1 g/dL 6.9  ?Total Bilirubin 0.3 - 1.2 mg/dL 0.5  ?Alkaline Phos 38 - 126 U/L 73  ?AST 15 - 41 U/L 14(L)  ?ALT 0 - 44 U/L 7  ? ?CBC Latest Ref Rng & Units 02/17/2022  ?WBC 4.0 - 10.5 K/uL 2.5(L)  ?Hemoglobin 12.0 - 15.0 g/dL 10.6(L)  ?Hematocrit 36.0 - 46.0 % 32.7(L)  ?Platelets 150 -  400 K/uL 270  ? ? ?Assessment and plan- Patient is a 66 y.o. 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  She is here fo

## 2022-02-17 NOTE — Telephone Encounter (Signed)
Received incoming fax today from patient's sister, Alicia Coleman, request a brief form to be completed for LOA.  I spoke with the patient today while she was in infusion to determine the type of LOA needed for her sister. Per patient, Alicia Coleman lives in [place] and may need to be available during acute illness, but she deferred me to reach out to her sister at [phone] to further discuss the specifics of the LOA form. I contacted Alicia Coleman, who requested intermittent leave. Start Date is technically TBD, but Alicia Coleman requested to write today's date for the start date. Our clinic can reevaluate the need for intermittent FMLA in 1 years time per pt's sister. Pt sister requested that a copy of the form be given directly to the patient.   ? ?Form signed by Dr. Janese Banks and a copy of the form was given to the patient. Pt had also signed a consent on the form to release her information to her sister's fmla ? ?Sister's FMLA faxed. Confirmation rcvd. Copy of form sent to HIM to be scanned into patient's chart. ?

## 2022-02-18 ENCOUNTER — Ambulatory Visit
Admission: RE | Admit: 2022-02-18 | Discharge: 2022-02-18 | Disposition: A | Discharge status: Home / Self Care | Payer: BC Managed Care – PPO | Source: Ambulatory Visit | Attending: Radiation Oncology | Admitting: Radiation Oncology

## 2022-02-18 ENCOUNTER — Encounter: Payer: Self-pay | Admitting: Oncology

## 2022-02-18 DIAGNOSIS — Z510 Encounter for antineoplastic radiation therapy: Secondary | ICD-10-CM | POA: Diagnosis not present

## 2022-02-18 LAB — CEA: CEA: 347 ng/mL — ABNORMAL HIGH (ref 0.0–4.7)

## 2022-02-19 ENCOUNTER — Other Ambulatory Visit: Payer: Self-pay

## 2022-02-19 ENCOUNTER — Ambulatory Visit
Admission: RE | Admit: 2022-02-19 | Discharge: 2022-02-19 | Disposition: A | Discharge status: Home / Self Care | Payer: BC Managed Care – PPO | Source: Ambulatory Visit | Attending: Radiation Oncology | Admitting: Radiation Oncology

## 2022-02-19 ENCOUNTER — Inpatient Hospital Stay: Payer: BC Managed Care – PPO

## 2022-02-19 VITALS — BP 102/74 | HR 93 | Temp 97.0°F | Resp 18

## 2022-02-19 DIAGNOSIS — Z510 Encounter for antineoplastic radiation therapy: Secondary | ICD-10-CM | POA: Diagnosis not present

## 2022-02-19 DIAGNOSIS — C189 Malignant neoplasm of colon, unspecified: Secondary | ICD-10-CM

## 2022-02-19 MED ORDER — HEPARIN SOD (PORK) LOCK FLUSH 100 UNIT/ML IV SOLN
500.0000 [IU] | Freq: Once | INTRAVENOUS | Status: AC | PRN
  Administered 2022-02-19: 500 [IU]
  Filled 2022-02-19: qty 5

## 2022-02-19 MED ORDER — SODIUM CHLORIDE 0.9% FLUSH
10.0000 mL | INTRAVENOUS | Status: DC | PRN
  Administered 2022-02-19: 10 mL
  Filled 2022-02-19: qty 10

## 2022-02-19 MED ORDER — PEGFILGRASTIM-CBQV 6 MG/0.6ML ~~LOC~~ SOSY
6.0000 mg | PREFILLED_SYRINGE | Freq: Once | SUBCUTANEOUS | Status: AC
  Administered 2022-02-19: 6 mg via SUBCUTANEOUS
  Filled 2022-02-19: qty 0.6

## 2022-02-20 ENCOUNTER — Ambulatory Visit
Admission: RE | Admit: 2022-02-20 | Discharge: 2022-02-20 | Disposition: A | Discharge status: Home / Self Care | Payer: BC Managed Care – PPO | Source: Ambulatory Visit | Attending: Radiation Oncology | Admitting: Radiation Oncology

## 2022-02-20 DIAGNOSIS — Z510 Encounter for antineoplastic radiation therapy: Secondary | ICD-10-CM | POA: Diagnosis not present

## 2022-02-23 ENCOUNTER — Ambulatory Visit
Admission: RE | Admit: 2022-02-23 | Discharge: 2022-02-23 | Disposition: A | Discharge status: Home / Self Care | Payer: BC Managed Care – PPO | Source: Ambulatory Visit | Attending: Radiation Oncology | Admitting: Radiation Oncology

## 2022-02-23 DIAGNOSIS — Z510 Encounter for antineoplastic radiation therapy: Secondary | ICD-10-CM | POA: Diagnosis not present

## 2022-02-24 ENCOUNTER — Telehealth: Payer: Self-pay | Admitting: *Deleted

## 2022-02-24 ENCOUNTER — Ambulatory Visit
Admission: RE | Admit: 2022-02-24 | Discharge: 2022-02-24 | Disposition: A | Discharge status: Home / Self Care | Payer: BC Managed Care – PPO | Source: Ambulatory Visit | Attending: Radiation Oncology | Admitting: Radiation Oncology

## 2022-02-24 DIAGNOSIS — Z510 Encounter for antineoplastic radiation therapy: Secondary | ICD-10-CM | POA: Diagnosis not present

## 2022-02-24 NOTE — Telephone Encounter (Signed)
Patient's sister, Karin Lieu. [phone], called the cancer center and requested a return phone call. I reached out to pt's sister. She requested that her FMLA start date to be changed to 4/3. I updated this date on sister's fmla and refaxed the form to [phone]. ?

## 2022-02-25 ENCOUNTER — Inpatient Hospital Stay
Admission: EM | Admit: 2022-02-25 | Discharge: 2022-03-01 | DRG: 808 | Disposition: A | Discharge status: Home / Self Care | Payer: BC Managed Care – PPO | Attending: Internal Medicine | Admitting: Internal Medicine

## 2022-02-25 ENCOUNTER — Ambulatory Visit: Payer: BC Managed Care – PPO

## 2022-02-25 ENCOUNTER — Other Ambulatory Visit: Payer: Self-pay

## 2022-02-25 ENCOUNTER — Emergency Department: Payer: BC Managed Care – PPO

## 2022-02-25 DIAGNOSIS — Z20822 Contact with and (suspected) exposure to covid-19: Secondary | ICD-10-CM | POA: Diagnosis present

## 2022-02-25 DIAGNOSIS — R6510 Systemic inflammatory response syndrome (SIRS) of non-infectious origin without acute organ dysfunction: Secondary | ICD-10-CM

## 2022-02-25 DIAGNOSIS — R54 Age-related physical debility: Secondary | ICD-10-CM | POA: Diagnosis present

## 2022-02-25 DIAGNOSIS — Z933 Colostomy status: Secondary | ICD-10-CM

## 2022-02-25 DIAGNOSIS — Z66 Do not resuscitate: Secondary | ICD-10-CM | POA: Diagnosis present

## 2022-02-25 DIAGNOSIS — C19 Malignant neoplasm of rectosigmoid junction: Secondary | ICD-10-CM | POA: Diagnosis present

## 2022-02-25 DIAGNOSIS — C189 Malignant neoplasm of colon, unspecified: Secondary | ICD-10-CM

## 2022-02-25 DIAGNOSIS — E860 Dehydration: Secondary | ICD-10-CM | POA: Diagnosis present

## 2022-02-25 DIAGNOSIS — Z823 Family history of stroke: Secondary | ICD-10-CM

## 2022-02-25 DIAGNOSIS — R55 Syncope and collapse: Secondary | ICD-10-CM | POA: Diagnosis not present

## 2022-02-25 DIAGNOSIS — R197 Diarrhea, unspecified: Secondary | ICD-10-CM | POA: Diagnosis present

## 2022-02-25 DIAGNOSIS — Z888 Allergy status to other drugs, medicaments and biological substances status: Secondary | ICD-10-CM

## 2022-02-25 DIAGNOSIS — R7989 Other specified abnormal findings of blood chemistry: Secondary | ICD-10-CM | POA: Diagnosis not present

## 2022-02-25 DIAGNOSIS — Z809 Family history of malignant neoplasm, unspecified: Secondary | ICD-10-CM

## 2022-02-25 DIAGNOSIS — E876 Hypokalemia: Secondary | ICD-10-CM | POA: Diagnosis not present

## 2022-02-25 DIAGNOSIS — T451X5A Adverse effect of antineoplastic and immunosuppressive drugs, initial encounter: Secondary | ICD-10-CM | POA: Diagnosis present

## 2022-02-25 DIAGNOSIS — R5081 Fever presenting with conditions classified elsewhere: Secondary | ICD-10-CM | POA: Diagnosis present

## 2022-02-25 DIAGNOSIS — E43 Unspecified severe protein-calorie malnutrition: Secondary | ICD-10-CM

## 2022-02-25 DIAGNOSIS — C787 Secondary malignant neoplasm of liver and intrahepatic bile duct: Secondary | ICD-10-CM | POA: Diagnosis present

## 2022-02-25 DIAGNOSIS — Z8249 Family history of ischemic heart disease and other diseases of the circulatory system: Secondary | ICD-10-CM

## 2022-02-25 DIAGNOSIS — D61818 Other pancytopenia: Principal | ICD-10-CM

## 2022-02-25 DIAGNOSIS — R531 Weakness: Secondary | ICD-10-CM | POA: Diagnosis not present

## 2022-02-25 DIAGNOSIS — D709 Neutropenia, unspecified: Secondary | ICD-10-CM

## 2022-02-25 LAB — CBC WITH DIFFERENTIAL/PLATELET
Abs Immature Granulocytes: 0.01 10*3/uL (ref 0.00–0.07)
Basophils Absolute: 0 10*3/uL (ref 0.0–0.1)
Basophils Relative: 5 %
Eosinophils Absolute: 0 10*3/uL (ref 0.0–0.5)
Eosinophils Relative: 0 %
HCT: 33.9 % — ABNORMAL LOW (ref 36.0–46.0)
Hemoglobin: 10.7 g/dL — ABNORMAL LOW (ref 12.0–15.0)
Immature Granulocytes: 3 %
Lymphocytes Relative: 24 %
Lymphs Abs: 0.1 10*3/uL — ABNORMAL LOW (ref 0.7–4.0)
MCH: 27.4 pg (ref 26.0–34.0)
MCHC: 31.6 g/dL (ref 30.0–36.0)
MCV: 86.7 fL (ref 80.0–100.0)
Monocytes Absolute: 0.1 10*3/uL (ref 0.1–1.0)
Monocytes Relative: 34 %
Neutro Abs: 0.1 10*3/uL — CL (ref 1.7–7.7)
Neutrophils Relative %: 34 %
Platelets: 80 10*3/uL — ABNORMAL LOW (ref 150–400)
RBC: 3.91 MIL/uL (ref 3.87–5.11)
RDW: 14.1 % (ref 11.5–15.5)
Smear Review: DECREASED
WBC: 0.4 10*3/uL — CL (ref 4.0–10.5)
nRBC: 0 % (ref 0.0–0.2)

## 2022-02-25 LAB — COMPREHENSIVE METABOLIC PANEL
ALT: 8 U/L (ref 0–44)
AST: 18 U/L (ref 15–41)
Albumin: 2.6 g/dL — ABNORMAL LOW (ref 3.5–5.0)
Alkaline Phosphatase: 75 U/L (ref 38–126)
Anion gap: 8 (ref 5–15)
BUN: 9 mg/dL (ref 8–23)
CO2: 27 mmol/L (ref 22–32)
Calcium: 8.9 mg/dL (ref 8.9–10.3)
Chloride: 98 mmol/L (ref 98–111)
Creatinine, Ser: 0.75 mg/dL (ref 0.44–1.00)
GFR, Estimated: >60 mL/min (ref >60)
Glucose, Bld: 110 mg/dL — ABNORMAL HIGH (ref 70–99)
Potassium: 3.7 mmol/L (ref 3.5–5.1)
Sodium: 133 mmol/L — ABNORMAL LOW (ref 135–145)
Total Bilirubin: 0.8 mg/dL (ref 0.3–1.2)
Total Protein: 7.4 g/dL (ref 6.5–8.1)

## 2022-02-25 LAB — RESP PANEL BY RT-PCR (FLU A&B, COVID) ARPGX2
Influenza A by PCR: NEGATIVE
Influenza B by PCR: NEGATIVE
SARS Coronavirus 2 by RT PCR: NEGATIVE

## 2022-02-25 LAB — CBC
HCT: 34.2 % — ABNORMAL LOW (ref 36.0–46.0)
Hemoglobin: 10.7 g/dL — ABNORMAL LOW (ref 12.0–15.0)
MCH: 27.4 pg (ref 26.0–34.0)
MCHC: 31.3 g/dL (ref 30.0–36.0)
MCV: 87.7 fL (ref 80.0–100.0)
Platelets: 80 10*3/uL — ABNORMAL LOW (ref 150–400)
RBC: 3.9 MIL/uL (ref 3.87–5.11)
RDW: 14.1 % (ref 11.5–15.5)
WBC: 0.3 10*3/uL — CL (ref 4.0–10.5)
nRBC: 0 % (ref 0.0–0.2)

## 2022-02-25 LAB — LACTIC ACID, PLASMA
Lactic Acid, Venous: 3.3 mmol/L (ref 0.5–1.9)
Lactic Acid, Venous: 2.3 mmol/L (ref 0.5–1.9)

## 2022-02-25 LAB — PROTIME-INR
INR: 1.3 — ABNORMAL HIGH (ref 0.8–1.2)
Prothrombin Time: 16.2 seconds — ABNORMAL HIGH (ref 11.4–15.2)

## 2022-02-25 LAB — CK: Total CK: 9 U/L — ABNORMAL LOW (ref 38–234)

## 2022-02-25 LAB — MAGNESIUM: Magnesium: 2.1 mg/dL (ref 1.7–2.4)

## 2022-02-25 LAB — PROCALCITONIN: Procalcitonin: 8.89 ng/mL

## 2022-02-25 LAB — TROPONIN I (HIGH SENSITIVITY): Troponin I (High Sensitivity): 8 ng/L (ref <18)

## 2022-02-25 MED ORDER — ENSURE ENLIVE PO LIQD
237.0000 mL | Freq: Three times a day (TID) | ORAL | Status: DC
  Administered 2022-02-25 – 2022-03-01 (×10): 237 mL via ORAL

## 2022-02-25 MED ORDER — ENSURE ENLIVE PO LIQD: 237.0000 mL | Freq: Two times a day (BID) | ORAL | Status: DC

## 2022-02-25 MED ORDER — SODIUM CHLORIDE 0.9 % IV BOLUS
1000.0000 mL | Freq: Once | INTRAVENOUS | Status: AC
  Administered 2022-02-25: 1000 mL via INTRAVENOUS

## 2022-02-25 MED ORDER — ONDANSETRON 4 MG PO TBDP
4.0000 mg | ORAL_TABLET | Freq: Once | ORAL | Status: AC
  Administered 2022-02-25: 4 mg via ORAL
  Filled 2022-02-25: qty 1

## 2022-02-25 MED ORDER — ACETAMINOPHEN 325 MG PO TABS
650.0000 mg | ORAL_TABLET | Freq: Four times a day (QID) | ORAL | Status: DC | PRN
  Administered 2022-02-25 – 2022-02-26 (×2): 650 mg via ORAL
  Filled 2022-02-25 (×2): qty 2

## 2022-02-25 MED ORDER — DIPHENHYDRAMINE HCL 50 MG/ML IJ SOLN
12.5000 mg | Freq: Three times a day (TID) | INTRAMUSCULAR | Status: DC | PRN
Start: 2022-02-25 — End: 2022-03-01

## 2022-02-25 MED ORDER — SODIUM CHLORIDE 0.9 % IV SOLN
INTRAVENOUS | Status: DC
Start: 2022-02-25 — End: 2022-02-27

## 2022-02-25 MED ORDER — ADULT MULTIVITAMIN W/MINERALS CH
1.0000 | ORAL_TABLET | Freq: Every day | ORAL | Status: DC
  Administered 2022-02-25 – 2022-03-01 (×5): 1 via ORAL
  Filled 2022-02-25 (×4): qty 1

## 2022-02-25 NOTE — ED Notes (Signed)
Pt had syncopal episode while walking to her car.  ?

## 2022-02-25 NOTE — ED Notes (Signed)
Pt seemed to have a brief moment of unresponsiveness. Pt eyes were open but she did not answer for about 20 seconds. Then began dry heaving after this episode and was able to hold conversation after.  ?

## 2022-02-25 NOTE — ED Notes (Signed)
Critical Lactic acid 3.3. Dr. Jacqualine Code aware ?

## 2022-02-25 NOTE — ED Notes (Signed)
Critical lab WBC 0.3 Dr. Jacqualine Code made aware ?

## 2022-02-25 NOTE — Consult Note (Addendum)
? ?Hematology/Oncology Consult note ?[place] ?[phone]) [phone] [phone] ? ?Patient Care Team: ?Pcp, No as PCP - General ?Clent Jacks, RN as Oncology Nurse Navigator ?Sindy Guadeloupe, MD as Consulting Physician (Oncology) ?[name], MD as Consulting Physician (Gastroenterology) ?Jules Husbands, MD as Consulting Physician (General Surgery)  ? ?Name of the patient: Alicia Coleman  ?[id number]  ?08/08/1956  ? ? ?Reason for consult: Metastatic colon cancer ?  ?Requesting physician: Dr. Jacqualine Code ? ?Date of visit: 02/25/22 ? ? ?History of presenting illness-patient is a 66 year old female with history of metastatic colon cancer with liver metastases currently on second line FOLFIRI chemotherapy.  She has been admitted for generalized weakness as well as poor appetite and oral intake.  She is currently undergoing palliative radiation treatment and last received chemotherapy about a week ago with Ellen Henri support. ? ?ECOG PS- 2 ? ?Pain scale- 0 ? ? ?Review of systems- Review of Systems  ?Constitutional:  Positive for malaise/fatigue. Negative for chills, fever and weight loss.  ?HENT:  Negative for congestion, ear discharge and nosebleeds.   ?Eyes:  Negative for blurred vision.  ?Respiratory:  Negative for cough, hemoptysis, sputum production, shortness of breath and wheezing.   ?Cardiovascular:  Negative for chest pain, palpitations, orthopnea and claudication.  ?Gastrointestinal:  Positive for diarrhea and nausea. Negative for abdominal pain, blood in stool, constipation, heartburn, melena and vomiting.  ?Genitourinary:  Negative for dysuria, flank pain, frequency, hematuria and urgency.  ?Musculoskeletal:  Negative for back pain, joint pain and myalgias.  ?Skin:  Negative for rash.  ?Neurological:  Negative for dizziness, tingling, focal weakness, seizures, weakness and headaches.  ?Endo/Heme/Allergies:  Does not bruise/bleed easily.  ?Psychiatric/Behavioral:  Negative for  depression and suicidal ideas. The patient does not have insomnia.   ? ?Allergies  ?Allergen Reactions  ? Oxaliplatin Nausea And Vomiting  ? ? ?Patient Active Problem List  ? Diagnosis Date Noted  ? Pancytopenia ([place]) 02/25/2022  ? Syncope 02/25/2022  ? SIRS (systemic inflammatory response syndrome) ([place]) 02/25/2022  ? Protein-calorie malnutrition, severe ([place]) 02/25/2022  ? Elevated lactic acid level 02/25/2022  ? Generalized weakness 02/25/2022  ? GI bleed 01/15/2022  ? Gastrointestinal hemorrhage   ? Acute blood loss anemia   ? Hematochezia 01/14/2022  ? Colostomy status ([place]) 01/14/2022  ? Metastatic colon cancer in female [facility]) 04/25/2021  ? Goals of care, counseling/discussion 04/25/2021  ? Iron deficiency anemia 03/30/2021  ? ? ? ?Past Medical History:  ?Diagnosis Date  ? Allergy   ? Anemia   ? Cancer [facility])   ? Colon cancer ([place])   ? GI bleed   ? ? ? ?Past Surgical History:  ?Procedure Laterality Date  ? COLONOSCOPY WITH PROPOFOL N/A 01/16/2022  ? Procedure: COLONOSCOPY WITH PROPOFOL;  Surgeon: [name], MD;  Location: [facility] ENDOSCOPY;  Service: Gastroenterology;  Laterality: N/A;  ? ESOPHAGOGASTRODUODENOSCOPY  01/16/2022  ? Procedure: ESOPHAGOGASTRODUODENOSCOPY (EGD);  Surgeon: [name], MD;  Location: [facility] ENDOSCOPY;  Service: Gastroenterology;;  ? [place] N/A 04/24/2021  ? Procedure: FLEXIBLE SIGMOIDOSCOPY;  Surgeon: [name], MD;  Location: [facility] ENDOSCOPY;  Service: Gastroenterology;  Laterality: N/A;  ? IR CV LINE INJECTION  11/13/2021  ? IR IMAGING GUIDED PORT INSERTION  11/19/2021  ? IR REMOVAL TUN ACCESS W/ PORT W/O FL MOD SED  11/19/2021  ? PORTACATH PLACEMENT N/A 05/01/2021  ? Procedure: INSERTION PORT-A-CATH;  Surgeon: Jules Husbands, MD;  Location: [facility] ORS;  Service: General;  Laterality: N/A;  ? ? ?Social History  ? ?  Socioeconomic History  ? Marital status: Married  ?  Spouse name: Not on file  ? Number of children: Not on file  ? Years of education: Not on file  ? Highest  education level: Not on file  ?Occupational History  ? Not on file  ?Tobacco Use  ? Smoking status: Never  ? Smokeless tobacco: Never  ?Vaping Use  ? Vaping Use: Never used  ?Substance and Sexual Activity  ? Alcohol use: Not Currently  ? Drug use: Never  ? Sexual activity: Not Currently  ?Other Topics Concern  ? Not on file  ?Social History Narrative  ? Not on file  ? ?Social Determinants of Health  ? ?Financial Resource Strain: Not on file  ?Food Insecurity: Not on file  ?Transportation Needs: Not on file  ?Physical Activity: Not on file  ?Stress: Not on file  ?Social Connections: Not on file  ?Intimate Partner Violence: Not on file  ? ?  ?Family History  ?Problem Relation Age of Onset  ? Stroke Mother   ? Hypertension Mother   ? Cancer Father   ? ? ? ?Current Facility-Administered Medications:  ?  0.9 %  sodium chloride infusion, , Intravenous, Continuous, Ivor Costa, MD ?  acetaminophen (TYLENOL) tablet 650 mg, 650 mg, Oral, Q6H PRN, Ivor Costa, MD ?  diphenhydrAMINE (BENADRYL) injection 12.5 mg, 12.5 mg, Intravenous, Q8H PRN, Ivor Costa, MD ?  feeding supplement (ENSURE ENLIVE / ENSURE PLUS) liquid 237 mL, 237 mL, Oral, TID BM, Ivor Costa, MD ?  multivitamin with minerals tablet 1 tablet, 1 tablet, Oral, Daily, Ivor Costa, MD ? ?Current Outpatient Medications:  ?  potassium chloride SA (KLOR-CON M) 20 MEQ tablet, Take 20 mEq by mouth daily., Disp: , Rfl:  ?  acetaminophen (TYLENOL) 325 MG tablet, Take 650 mg by mouth every 6 (six) hours as needed., Disp: , Rfl:  ?  lidocaine-prilocaine (EMLA) cream, Apply 1 application. topically as needed. Apply to port and cover with saran wrap 1-2 hours prior to port access, Disp: 30 g, Rfl: 4 ? ? ?Physical exam:  ?Vitals:  ? 02/25/22 1300 02/25/22 1330 02/25/22 1400 02/25/22 1430  ?BP: 103/81 123/80 127/84 115/84  ?Pulse: (!) 105 93 82 84  ?Resp: '16 16 18   '$ ?Temp:      ?TempSrc:      ?SpO2: 100% 100% 100% 100%  ? ?Physical Exam ?Constitutional:   ?   Comments: She is thin  and cachectic.  Appears fatigued  ?HENT:  ?   Mouth/Throat:  ?   Comments: Mucous membranes dry ?Cardiovascular:  ?   Rate and Rhythm: Regular rhythm. Tachycardia present.  ?   Heart sounds: Normal heart sounds.  ?Pulmonary:  ?   Effort: Pulmonary effort is normal.  ?   Breath sounds: Normal breath sounds.  ?Abdominal:  ?   General: Bowel sounds are normal.  ?   Palpations: Abdomen is soft.  ?   Comments: Palliative colostomy in place  ?Skin: ?   General: Skin is warm and dry.  ?Neurological:  ?   Mental Status: She is alert and oriented to person, place, and time.  ?  ? ? ? ? ?  Latest Ref Rng & Units 02/25/2022  ? 12:46 PM  ?CMP  ?Glucose 70 - 99 mg/dL 110    ?BUN 8 - 23 mg/dL 9    ?Creatinine 0.44 - 1.00 mg/dL 0.75    ?Sodium 135 - 145 mmol/L 133    ?Potassium 3.5 - 5.1 mmol/L  3.7    ?Chloride 98 - 111 mmol/L 98    ?CO2 22 - 32 mmol/L 27    ?Calcium 8.9 - 10.3 mg/dL 8.9    ?Total Protein 6.5 - 8.1 g/dL 7.4    ?Total Bilirubin 0.3 - 1.2 mg/dL 0.8    ?Alkaline Phos 38 - 126 U/L 75    ?AST 15 - 41 U/L 18    ?ALT 0 - 44 U/L 8    ? ? ?  Latest Ref Rng & Units 02/25/2022  ?  3:15 PM  ?CBC  ?WBC 4.0 - 10.5 K/uL 0.4    ?Hemoglobin 12.0 - 15.0 g/dL 10.7    ?Hematocrit 36.0 - 46.0 % 33.9    ?Platelets 150 - 400 K/uL 80    ? ? ?'@IMAGES'$ @ ? ?DG Chest Port 1 View ? ?Result Date: 02/25/2022 ?CLINICAL DATA:  Increased weakness EXAM: PORTABLE CHEST 1 VIEW COMPARISON:  Radiograph May 01, 2021 FINDINGS: Left chest wall Port-A-Cath with tip overlying the superior cavoatrial confluence. The heart size and mediastinal contours are within normal limits. No focal airspace consolidation. No visible pleural effusion or pneumothorax. The visualized skeletal structures are unremarkable. IMPRESSION: No acute cardiopulmonary findings. Electronically Signed   By: Dahlia Bailiff M.D.   On: 02/25/2022 13:14   ? ?Assessment and plan- Patient is a 66 y.o. female with metastatic colorectal adenocarcinoma with liver metastases currently on Second line  FOLFIRI chemotherapy admitted for generalized weakness and dehydration ? ?Labs are not indicated above AKI.  She does have neutropenia which would be expected 1 week after chemotherapy.  She did receive the Udenyc

## 2022-02-25 NOTE — ED Provider Notes (Signed)
? ?[facility] ?Provider Note ? ? ? Event Date/Time  ? First MD Initiated Contact with Patient 02/25/22 1221   ?  (approximate) ? ? ?History  ? ?Weakness ? ? ?HPI ? ?Alicia Coleman is a 66 y.o. female PMH per Dr. Janese Banks recent note: Patient is a 66 y.o. 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   ? ?Currently undergoing radiation and chemotherapy had radiation yesterday.  For about 3 days now is felt more fatigued than usual.  To the point she can barely walk or stand, she was going to her appointment today and she was so weak that she sort of fell alongside of the car.  She did not fall and fashion per se that she injured herself but rather she reports she felt so weak after trying to the car she basically had to fall to the ground.  Her sister brings her in today.  No head injury or injury from the fall ? ?No chest pain.  Denies cough or shortness of breath.  No pain or burning with urination.  She denies fever.  Felt nauseated earlier today that resolved.  No pain.  No abdominal pain reports having frequent loose ostomy output for a weeks now, at 1 point cause her potassium to be low.  No bloody ostomy output ? ?She reports very little oral intake not able to tolerate taking solids now for several days ? ? ?Physical Exam  ? ?Triage Vital Signs: ?ED Triage Vitals  ?Enc Vitals Group  ?   BP 02/25/22 0958 90/73  ?   Pulse Rate 02/25/22 0958 (!) 137  ?   Resp 02/25/22 0958 18  ?   Temp 02/25/22 0958 97.9 ?F (36.6 ?C)  ?   Temp Source 02/25/22 0958 Oral  ?   SpO2 02/25/22 0958 100 %  ?   Weight --   ?   Height --   ?   Head Circumference --   ?   Peak Flow --   ?   Pain Score 02/25/22 0957 10  ?   Pain Loc --   ?   Pain Edu? --   ?   Excl. in [place]? --   ? ? ?Most recent vital signs: ?Vitals:  ? 02/25/22 1400 02/25/22 1430  ?BP: 127/84 115/84  ?Pulse: 82 84  ?Resp: 18   ?Temp:    ?SpO2: 100% 100%   ? ? ? ?General: Awake, no distress.  She appears quite chronically ill frail and notably fatigued.  Generally weak.  Alert and oriented very pleasant ?CV:  Good peripheral perfusion.  Tachycardia.  No murmurs or rubs ?Resp:  Normal effort.  Clear lung sounds.  Normal work of breathing ?Abd:  No distention.  Soft nontender nondistended, ostomy draining brown slightly loose stool ?Other:  Moves all extremities to commands but appears grossly fatigued about 4+ strength in all extremities. ?Mucous membranes extremely dry in the mouth.  Skin turgor is poor ? ? ?ED Results / Procedures / Treatments  ? ?Labs ?(all labs ordered are listed, but only abnormal results are displayed) ?Labs Reviewed  ?CBC - Abnormal; Notable for the following components:  ?    Result Value  ? WBC 0.3 (*)   ? Hemoglobin 10.7 (*)   ? HCT 34.2 (*)   ? Platelets 80 (*)   ? All other components within normal limits  ?COMPREHENSIVE METABOLIC PANEL - Abnormal; Notable for the following components:  ?  Sodium 133 (*)   ? Glucose, Bld 110 (*)   ? Albumin 2.6 (*)   ? All other components within normal limits  ?LACTIC ACID, PLASMA - Abnormal; Notable for the following components:  ? Lactic Acid, Venous 3.3 (*)   ? All other components within normal limits  ?CK - Abnormal; Notable for the following components:  ? Total CK 9 (*)   ? All other components within normal limits  ?RESP PANEL BY RT-PCR (FLU A&B, COVID) [id number]  ?URINALYSIS, ROUTINE W REFLEX MICROSCOPIC  ?LACTIC ACID, PLASMA  ?MAGNESIUM  ?CBG MONITORING, ED  ?TROPONIN I (HIGH SENSITIVITY)  ? ? ? ?EKG ? ?Reviewed inter by me at 1005 ?Heart rate 140 ?QRS 60 ?QTc 530 ?Notable sinus tachycardia, mild nonspecific T wave abnormality.   ? ? ? ?RADIOLOGY ? ? ?Chest x-ray personally viewed interpreted by me, negative for acute finding ? ? ?PROCEDURES: ? ?Critical Care performed: No ? ?Procedures ? ? ?MEDICATIONS ORDERED IN ED: ?Medications  ?0.9 %  sodium chloride infusion (has no administration in time  range)  ?ondansetron (ZOFRAN-ODT) disintegrating tablet 4 mg (4 mg Oral Given 02/25/22 1013)  ?sodium chloride 0.9 % bolus 1,000 mL (0 mLs Intravenous Stopped 02/25/22 1448)  ? ? ? ?IMPRESSION / MDM / ASSESSMENT AND PLAN / ED COURSE  ?I reviewed the triage vital signs and the nursing notes. ?             ?               ? ?Differential diagnosis includes, but is not limited to, dehydration, electrolyte abnormality, anemia, complication from chemoradiation, infectious etiologies etc.  Does not ? ?The patient is on the cardiac monitor to evaluate for evidence of arrhythmia and/or significant heart rate changes. ? ?The patient presented with hypotension and significant tachycardia with a heart rate greater than 140 on initial presentation.  She appears dry and dehydrated reports poor oral intake.  She had what sounds to be a episode of near syncope preceding her evaluation.  No injury denies head strike no neck pain. ? ?----------------------------------------- ?3:02 PM on 02/25/2022 ?----------------------------------------- ?Patient does appear improved her heart rate and blood pressure have improved after fluid hydration.  She has elevated lactic acid, not greater than 4, and no clear evidence of infection but notable severe neutropenia.  Placed patient on neutropenic precautions ? ?I placed a consult to Dr. Janese Banks who will be seeing the patient this afternoon for consult, and given her presentation with significant tachycardia as well as hypotension in the setting of episode of weakness/near syncope given her known morbidities we will admit for further care and treatment.  She is at elevated risk of recurrent syncope and possible morbidity or mortality given her clinical assessment and presenting symptoms today. ? ?Discussed with the patient plan to admit, she is very agreeable.  Consulted and discussed case with Dr. Blaine Hamper of the hospitalist service who will accept in admission ? ?  ? ? ?FINAL CLINICAL IMPRESSION(S) / ED  DIAGNOSES  ? ?Final diagnoses:  ?Dehydration  ?Generalized weakness  ?Near syncope  ? ? ? ?Rx / DC Orders  ? ?ED Discharge Orders   ? ? None  ? ?  ? ? ? ?Note:  This document was prepared using Dragon voice recognition software and may include unintentional dictation errors. ?  Delman Kitten, MD ?02/25/22 1503 ? ?

## 2022-02-25 NOTE — H&P (Addendum)
?History and Physical  ? ? ?[name] XNA:[id number] DOB: 1956-08-25 DOA: 02/25/2022 ? ?Referring MD/NP/PA:  ? ?PCP: Pcp, No  ? ?Patient coming from:  The patient is coming from home.   ?   ? ?Chief Complaint: weakness ? ?HPI: Alicia Coleman is a 66 y.o. female with medical history significant of metastatic colorectal adenocarcinoma with liver metastases and obstructive rectal lesion, s/p diverting palliative colostomy, anemia, GI bleeding, who presents with weakness ? ?Patient is currently receiving radiation and chemotherapy.  In the past several days, she developed generalized weakness which has been progressively worsening.  She states that she feels so weak that she can barely walk or stand. She states that she was going to her appointment today and she was so weak that she slowly lowered her body on the ground.  She strongly denies fall or any injury.  Per report, patient had possible syncope, but the patient strongly denies loss of consciousness which was confirmed by her sister at the bedside.  Both of them states that patient just feels whole-body.  She does not have dizziness or lightheadedness. Patient denies chest pain, cough, shortness breath.  She states that she has diarrhea in the past several days, with 2 or 3 times of loose stool bowel movement each day. No nausea, vomiting or abdominal pain.  No symptoms of UTI. ? ?Data Reviewed and ED Course: pt was found to have pancytopenia with WBC 0.3, hemoglobin 10.7 and platelet 88  (patient had WBC 2.5, hemoglobin 10.6, platelet 270 on 02/17/22), troponin level 8, CK level 9.0, negative COVID PCR, GFR> 60.  Temperature normal, initial blood pressure 90/73 which improved to 127/84 after giving 1 L normal saline bolus.  Heart rate 137 --> 82, RR 18, oxygen saturation 100%.  Chest x-ray negative.  Patient is placed on telemetry bed follow-up physician, Dr. Janese Banks of oncology is consulted ? ? ?EKG: I have personally reviewed.  Sinus rhythm, QTc 529, heart rate  141, bilateral atrial enlargement. ? ? ?Review of Systems:  ? ?General: no fevers, chills, no body weight gain, has poor appetite, has fatigue ?HEENT: no blurry vision, hearing changes or sore throat ?Respiratory: no dyspnea, coughing, wheezing ?CV: no chest pain, no palpitations ?GI: no nausea, vomiting, abdominal pain, has diarrhea, no constipation ?GU: no dysuria, burning on urination, increased urinary frequency, hematuria  ?Ext: no leg edema ?Neuro: no unilateral weakness, numbness, or tingling, no vision change or hearing loss ?Skin: no rash, no skin tear. ?MSK: No muscle spasm, no deformity, no limitation of range of movement in spin ?Heme: No easy bruising.  ?Travel history: No recent long distant travel. ? ? ?Allergy:  ?Allergies  ?Allergen Reactions  ? Oxaliplatin Nausea And Vomiting  ? ? ?Past Medical History:  ?Diagnosis Date  ? Allergy   ? Anemia   ? Cancer [facility])   ? Colon cancer ([place])   ? GI bleed   ? ? ?Past Surgical History:  ?Procedure Laterality Date  ? COLONOSCOPY WITH PROPOFOL N/A 01/16/2022  ? Procedure: COLONOSCOPY WITH PROPOFOL;  Surgeon: [name], MD;  Location: [facility] ENDOSCOPY;  Service: Gastroenterology;  Laterality: N/A;  ? ESOPHAGOGASTRODUODENOSCOPY  01/16/2022  ? Procedure: ESOPHAGOGASTRODUODENOSCOPY (EGD);  Surgeon: [name], MD;  Location: [facility] ENDOSCOPY;  Service: Gastroenterology;;  ? [place] N/A 04/24/2021  ? Procedure: FLEXIBLE SIGMOIDOSCOPY;  Surgeon: [name], MD;  Location: [facility] ENDOSCOPY;  Service: Gastroenterology;  Laterality: N/A;  ? IR CV LINE INJECTION  11/13/2021  ? IR IMAGING GUIDED PORT INSERTION  11/19/2021  ?  IR REMOVAL TUN ACCESS W/ PORT W/O FL MOD SED  11/19/2021  ? PORTACATH PLACEMENT N/A 05/01/2021  ? Procedure: INSERTION PORT-A-CATH;  Surgeon: Jules Husbands, MD;  Location: [facility] ORS;  Service: General;  Laterality: N/A;  ? ? ?Social History:  reports that she has never smoked. She has never used smokeless tobacco. She reports that she does not  currently use alcohol. She reports that she does not use drugs. ? ?Family History:  ?Family History  ?Problem Relation Age of Onset  ? Stroke Mother   ? Hypertension Mother   ? Cancer Father   ?  ? ?Prior to Admission medications   ?Medication Sig Start Date End Date Taking? Authorizing Provider  ?acetaminophen (TYLENOL) 325 MG tablet Take 650 mg by mouth every 6 (six) hours as needed.    [provider]  ?lidocaine-prilocaine (EMLA) cream Apply 1 application. topically as needed. Apply to port and cover with saran wrap 1-2 hours prior to port access 02/17/22   Sindy Guadeloupe, MD  ? ? ?Physical Exam: ?Vitals:  ? 02/25/22 1300 02/25/22 1330 02/25/22 1400 02/25/22 1430  ?BP: 103/81 123/80 127/84 115/84  ?Pulse: (!) 105 93 82 84  ?Resp: '16 16 18   '$ ?Temp:      ?TempSrc:      ?SpO2: 100% 100% 100% 100%  ? ?General: Not in acute distress.  Dry mucous membrane ?HEENT: ?      Eyes: PERRL, EOMI, no scleral icterus. ?      ENT: No discharge from the ears and nose, no pharynx injection, no tonsillar enlargement.  ?      Neck: No JVD, no bruit, no mass felt. ?Heme: No neck lymph node enlargement. ?Cardiac: S1/S2, RRR, No murmurs, No gallops or rubs. ?Respiratory: No rales, wheezing, rhonchi or rubs. ?GI: Soft, nondistended, nontender, no rebound pain, no organomegaly, BS present. ?GU: No hematuria ?Ext: No pitting leg edema bilaterally. 1+DP/PT pulse bilaterally. ?Musculoskeletal: No joint deformities, No joint redness or warmth, no limitation of ROM in spin. ?Skin: No rashes.  ?Neuro: Alert, oriented X3, cranial nerves II-XII grossly intact, moves all extremities normally. ?Psych: Patient is not psychotic, no suicidal or hemocidal ideation. ? ?Labs on Admission: I have personally reviewed following labs and imaging studies ? ?CBC: ?Recent Labs  ?Lab 02/25/22 ?1246 02/25/22 ?1515  ?WBC 0.3* 0.4*  ?NEUTROABS  --  0.1*  ?HGB 10.7* 10.7*  ?HCT 34.2* 33.9*  ?MCV 87.7 86.7  ?PLT 80* 80*  ? ?Basic Metabolic Panel: ?Recent  Labs  ?Lab 02/25/22 ?1246 02/25/22 ?1253  ?NA 133*  --   ?K 3.7  --   ?CL 98  --   ?CO2 27  --   ?GLUCOSE 110*  --   ?BUN 9  --   ?CREATININE 0.75  --   ?CALCIUM 8.9  --   ?MG  --  2.1  ? ?GFR: ?Estimated Creatinine Clearance: 54.8 mL/min (by C-G formula based on SCr of 0.75 mg/dL). ?Liver Function Tests: ?Recent Labs  ?Lab 02/25/22 ?1246  ?AST 18  ?ALT 8  ?ALKPHOS 75  ?BILITOT 0.8  ?PROT 7.4  ?ALBUMIN 2.6*  ? ?No results for input(s): LIPASE, AMYLASE in the last 168 hours. ?No results for input(s): AMMONIA in the last 168 hours. ?Coagulation Profile: ?Recent Labs  ?Lab 02/25/22 ?1011  ?INR 1.3*  ? ?Cardiac Enzymes: ?Recent Labs  ?Lab 02/25/22 ?1246  ?CKTOTAL 9*  ? ?BNP (last 3 results) ?No results for input(s): PROBNP in the last 8760 hours. ?HbA1C: ?No  results for input(s): HGBA1C in the last 72 hours. ?CBG: ?No results for input(s): GLUCAP in the last 168 hours. ?Lipid Profile: ?No results for input(s): CHOL, HDL, LDLCALC, TRIG, CHOLHDL, LDLDIRECT in the last 72 hours. ?Thyroid Function Tests: ?No results for input(s): TSH, T4TOTAL, FREET4, T3FREE, THYROIDAB in the last 72 hours. ?Anemia Panel: ?No results for input(s): VITAMINB12, FOLATE, FERRITIN, TIBC, IRON, RETICCTPCT in the last 72 hours. ?Urine analysis: ?   ?Component Value Date/Time  ? COLORURINE YELLOW (A) 03/27/2021 1612  ? APPEARANCEUR HAZY (A) 03/27/2021 1612  ? LABSPEC 1.024 03/27/2021 1612  ? PHURINE 5.0 03/27/2021 1612  ? GLUCOSEU NEGATIVE 03/27/2021 1612  ? HGBUR MODERATE (A) 03/27/2021 1612  ? [place] NEGATIVE 03/27/2021 1612  ? [place] NEGATIVE 03/27/2021 1612  ? PROTEINUR NEGATIVE 03/27/2021 1612  ? NITRITE NEGATIVE 03/27/2021 1612  ? LEUKOCYTESUR TRACE (A) 03/27/2021 1612  ? ?Sepsis Labs: ?'@LABRCNTIP'$ (procalcitonin:4,lacticidven:4) ?) ?Recent Results (from the past 240 hour(s))  ?Resp Panel by RT-PCR (Flu A&B, Covid) Nasopharyngeal Swab     Status: None  ? Collection Time: 02/25/22 12:44 PM  ? Specimen: Nasopharyngeal Swab;  Nasopharyngeal(NP) swabs in vial transport medium  ?Result Value Ref Range Status  ? SARS Coronavirus 2 by RT PCR NEGATIVE NEGATIVE Final  ?  Comment: (NOTE) ?SARS-CoV-2 target nucleic acids are NOT DETECTED. ? ?The SARS-

## 2022-02-25 NOTE — ED Notes (Signed)
Called lab to check on missing test results and why bmp was discontinued by lab. Per lab they didn't receive any blood for that test. Pt taken at this time to ED 26 and will advise RN and MD of situation. ?

## 2022-02-25 NOTE — ED Triage Notes (Signed)
Pt comes with c/o increased weakness, unable to eat anything. Pt is cancer pt and getting chem per family. Pt had appt this am but unable to go due to sickness. ?

## 2022-02-25 NOTE — Progress Notes (Signed)
Initial Nutrition Assessment ? ?DOCUMENTATION CODES:  ? ?Not applicable ? ?INTERVENTION:  ? ?-Ensure Enlive po TID, each supplement provides 350 kcal and 20 grams of protein ?-MVI with minerals daily ? ?NUTRITION DIAGNOSIS:  ? ?Increased nutrient needs related to cancer and cancer related treatments as evidenced by estimated needs. ? ?GOAL:  ? ?Patient will meet greater than or equal to 90% of their needs ? ?MONITOR:  ? ?PO intake, Supplement acceptance, Diet advancement, Labs, Weight trends, Skin, I & O's ? ?REASON FOR ASSESSMENT:  ? ?Consult ?Assessment of nutrition requirement/status ? ?ASSESSMENT:  ? ?Patient is a 66 y.o. 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 ? ?Pt admitted with dehydration, generalized weakness, and near syncope.  ?  ?Oncology consult pending. Pt currently undergoing chemo and radiation. Pt has increased weakness for the past 3 days PTA.  ? ?Pt unavailable at time of visit. RD unable to obtain further nutrition-related history or complete nutrition-focused physical exam at this time.   ? ?Pt currently on a regular diet. No meal completion data currently available at this time.  ? ?Reviewed wt hx; pt has experienced a 17% wt loss over the past 3 months, which is significant for time frame.  ? ?Pt is at high risk for malnutrition, however, unable to identify at this time. Pt would greatly benefit from addition of oral nutrition supplements.  ? ?Medications reviewed and include 0.9% sodium chloride infusion @ 100 ml/hr.  ? ?Labs reviewed: Na: 133.   ? ?Diet Order:   ?Diet Order   ? ?       ?  Diet regular Room service appropriate? Yes; Fluid consistency: Thin  Diet effective now       ?  ? ?  ?  ? ?  ? ? ?EDUCATION NEEDS:  ? ?No education needs have been identified at this time ? ?Skin:  Skin Assessment: Reviewed RN Assessment ? ?Last BM:  Unknown ? ?Height:  ? ?Ht Readings from Last 1 Encounters:   ?01/21/22 '5\' 6"'$  (1.676 m)  ? ? ?Weight:  ? ?Wt Readings from Last 1 Encounters:  ?02/17/22 48.9 kg  ? ? ?Ideal Body Weight:  59.1 kg ? ?BMI:  There is no height or weight on file to calculate BMI. ? ?Estimated Nutritional Needs:  ? ?Kcal:  1750-1950 ? ?Protein:  95-110 grams ? ?Fluid:  > 1.7 L ? ? ? ?Loistine Chance, RD, LDN, CDCES ?Registered Dietitian II ?Certified Diabetes Care and Education Specialist ?Please refer to [facility] for RD and/or RD on-call/weekend/after hours pager  ?

## 2022-02-25 NOTE — ED Notes (Signed)
One set of cultures, one blue top, gray, and purple able to be sent to lab. Pt does have port and would like to wait for the rest of her labs with port access.  ?

## 2022-02-26 ENCOUNTER — Ambulatory Visit: Payer: BC Managed Care – PPO

## 2022-02-26 DIAGNOSIS — D709 Neutropenia, unspecified: Secondary | ICD-10-CM

## 2022-02-26 DIAGNOSIS — R6510 Systemic inflammatory response syndrome (SIRS) of non-infectious origin without acute organ dysfunction: Secondary | ICD-10-CM | POA: Diagnosis not present

## 2022-02-26 DIAGNOSIS — D61818 Other pancytopenia: Secondary | ICD-10-CM | POA: Diagnosis not present

## 2022-02-26 DIAGNOSIS — R531 Weakness: Secondary | ICD-10-CM | POA: Diagnosis not present

## 2022-02-26 DIAGNOSIS — R5081 Fever presenting with conditions classified elsewhere: Secondary | ICD-10-CM

## 2022-02-26 LAB — URINALYSIS, COMPLETE (UACMP) WITH MICROSCOPIC
Bilirubin Urine: NEGATIVE
Glucose, UA: NEGATIVE mg/dL
Hgb urine dipstick: NEGATIVE
Ketones, ur: 5 mg/dL — AB
Leukocytes,Ua: NEGATIVE
Nitrite: NEGATIVE
Protein, ur: 30 mg/dL — AB
Specific Gravity, Urine: 1.03 (ref 1.005–1.030)
pH: 5 (ref 5.0–8.0)

## 2022-02-26 LAB — GASTROINTESTINAL PANEL BY PCR, STOOL (REPLACES STOOL CULTURE)

## 2022-02-26 LAB — CBC
HCT: 27 % — ABNORMAL LOW (ref 36.0–46.0)
Hemoglobin: 8.7 g/dL — ABNORMAL LOW (ref 12.0–15.0)
MCH: 27.5 pg (ref 26.0–34.0)
MCHC: 32.2 g/dL (ref 30.0–36.0)
MCV: 85.4 fL (ref 80.0–100.0)
Platelets: 61 10*3/uL — ABNORMAL LOW (ref 150–400)
RBC: 3.16 MIL/uL — ABNORMAL LOW (ref 3.87–5.11)
RDW: 13.8 % (ref 11.5–15.5)
WBC: 0.4 10*3/uL — CL (ref 4.0–10.5)
nRBC: 0 % (ref 0.0–0.2)

## 2022-02-26 LAB — C DIFFICILE QUICK SCREEN W PCR REFLEX
C Diff antigen: NEGATIVE
C Diff interpretation: NOT DETECTED
C Diff toxin: NEGATIVE

## 2022-02-26 MED ORDER — SODIUM CHLORIDE 0.9% FLUSH
10.0000 mL | Freq: Two times a day (BID) | INTRAVENOUS | Status: DC
  Administered 2022-02-26 – 2022-02-28 (×7): 10 mL

## 2022-02-26 MED ORDER — SODIUM CHLORIDE 0.9% FLUSH: 10.0000 mL | INTRAVENOUS | Status: DC | PRN

## 2022-02-26 MED ORDER — SODIUM CHLORIDE 0.9 % IV SOLN
2.0000 g | Freq: Three times a day (TID) | INTRAVENOUS | Status: DC
  Administered 2022-02-26 – 2022-02-28 (×6): 2 g via INTRAVENOUS
  Filled 2022-02-26 (×8): qty 2

## 2022-02-26 MED ORDER — CHLORHEXIDINE GLUCONATE CLOTH 2 % EX PADS
6.0000 | MEDICATED_PAD | Freq: Every day | CUTANEOUS | Status: DC
  Administered 2022-02-26 – 2022-03-01 (×4): 6 via TOPICAL

## 2022-02-26 NOTE — Evaluation (Signed)
Physical Therapy Evaluation ?Patient Details ?Name: Alicia Coleman ?MRN: [id number] ?DOB: [birth year] ?Today's Date: 02/26/2022 ? ?History of Present Illness ? Alicia Coleman is a 66 y.o. female with medical history significant of metastatic colorectal adenocarcinoma with liver metastases and obstructive rectal lesion, s/p diverting palliative colostomy, anemia, GI bleeding, who presents with weakness. Neutropenic and enteric precautions. ?  ?Clinical Impression ? Patient received in bed, she reports she is weak, but agreeable to PT assessment. She is difficult to understand at times due to low volume of speech. Patient is mod independent with bed mobility. Transfers with min guard and ambulated 20 feet x 2 holding to IV pole with min guard. Patient limited by abdominal pain and weakness. She will continue to benefit from skilled PT while here to improve strength, endurance and safety with mobility.    ?   ? ?Recommendations for follow up therapy are one component of a multi-disciplinary discharge planning process, led by the attending physician.  Recommendations may be updated based on patient status, additional functional criteria and insurance authorization. ? ?Follow Up Recommendations Home health PT ? ?  ?Assistance Recommended at Discharge Intermittent Supervision/Assistance  ?Patient can return home with the following ? Assist for transportation;Help with stairs or ramp for entrance;Assistance with cooking/housework;A little help with walking and/or transfers;A little help with bathing/dressing/bathroom ? ?  ?Equipment Recommendations Rolling walker (2 wheels);BSC/3in1  ?Recommendations for Other Services ?    ?  ?Functional Status Assessment Patient has had a recent decline in their functional status and demonstrates the ability to make significant improvements in function in a reasonable and predictable amount of time.  ? ?  ?Precautions / Restrictions Precautions ?Precautions: Fall ?Restrictions ?Weight Bearing  Restrictions: No  ? ?  ? ?Mobility ? Bed Mobility ?Overal bed mobility: Modified Independent ?  ?  ?  ?  ?  ?  ?General bed mobility comments: increased time, bed rails ?  ? ?Transfers ?Overall transfer level: Needs assistance ?Equipment used: None ?Transfers: Sit to/from Stand ?Sit to Stand: Min guard ?  ?  ?  ?  ?  ?  ?  ? ?Ambulation/Gait ?Ambulation/Gait assistance: Min guard ?Gait Distance (Feet): 40 Feet ?Assistive device: IV Pole ?Gait Pattern/deviations: Step-through pattern, Decreased step length - right, Decreased step length - left, Trunk flexed ?Gait velocity: decr ?  ?  ?General Gait Details: patient ambulated to door and back x2 holding to IV pole for support. She is weak , but had no overt LOB. Would benefit from AD moving forward to improve safety and balance. ? ?Stairs ?  ?  ?  ?  ?  ? ?Wheelchair Mobility ?  ? ?Modified Rankin (Stroke Patients Only) ?  ? ?  ? ?Balance Overall balance assessment: Needs assistance, Mild deficits observed, not formally tested ?Sitting-balance support: Feet supported ?Sitting balance-Leahy Scale: Good ?  ?  ?Standing balance support: Bilateral upper extremity supported, During functional activity ?Standing balance-Leahy Scale: Fair ?Standing balance comment: benefits from UE support at this time ?  ?  ?  ?  ?  ?  ?  ?  ?  ?  ?  ?   ? ? ? ?Pertinent Vitals/Pain Pain Assessment ?Pain Assessment: Faces ?Faces Pain Scale: Hurts little more ?Pain Location: abdominal ?Pain Descriptors / Indicators: Discomfort, Sore ?Pain Intervention(s): Monitored during session  ? ? ?Home Living Family/patient expects to be discharged to:: Private residence ?Living Arrangements: Non-relatives/Friends ?Available Help at Discharge: Friend(s);Available PRN/intermittently ?Type of Home: House ?Home Access: Stairs to enter ?Entrance  Stairs-Rails: Right;Left ?Entrance Stairs-Number of Steps: 2 ?  ?Home Layout: One level ?Home Equipment: None ?   ?  ?Prior Function Prior Level of Function :  Independent/Modified Independent;Driving ?  ?  ?  ?  ?  ?  ?Mobility Comments: patient reports she has not needed AD ?ADLs Comments: independent but getting progressively weaker ?  ? ? ?Hand Dominance  ?   ? ?  ?Extremity/Trunk Assessment  ? Upper Extremity Assessment ?Upper Extremity Assessment: Generalized weakness ?  ? ?Lower Extremity Assessment ?Lower Extremity Assessment: Generalized weakness ?  ? ?Cervical / Trunk Assessment ?Cervical / Trunk Assessment: Normal  ?Communication  ? Communication: No difficulties;Other (comment) (difficult to understand at times due to low volume)  ?Cognition Arousal/Alertness: Awake/alert ?Behavior During Therapy: [facility] for tasks assessed/performed ?Overall Cognitive Status: Within Functional Limits for tasks assessed ?  ?  ?  ?  ?  ?  ?  ?  ?  ?  ?  ?  ?  ?  ?  ?  ?  ?  ?  ? ?  ?General Comments   ? ?  ?Exercises    ? ?Assessment/Plan  ?  ?PT Assessment Patient needs continued PT services  ?PT Problem List Decreased strength;Decreased mobility;Decreased activity tolerance;Decreased balance;Pain;Decreased knowledge of use of DME ? ?   ?  ?PT Treatment Interventions DME instruction;Therapeutic exercise;Gait training;Balance training;Stair training;Functional mobility training;Therapeutic activities;Patient/family education   ? ?PT Goals (Current goals can be found in the Care Plan section)  ?Acute Rehab PT Goals ?Patient Stated Goal: to feel better and return home ?PT Goal Formulation: With patient ?Time For Goal Achievement: 03/12/22 ?Potential to Achieve Goals: Fair ? ?  ?Frequency Min 2X/week ?  ? ? ?Co-evaluation   ?  ?  ?  ?  ? ? ?  ?AM-PAC PT "6 Clicks" Mobility  ?Outcome Measure Help needed turning from your back to your side while in a flat bed without using bedrails?: None ?Help needed moving from lying on your back to sitting on the side of a flat bed without using bedrails?: None ?Help needed moving to and from a bed to a chair (including a wheelchair)?: A Little ?Help  needed standing up from a chair using your arms (e.g., wheelchair or bedside chair)?: A Little ?Help needed to walk in hospital room?: A Little ?Help needed climbing 3-5 steps with a railing? : A Lot ?6 Click Score: 19 ? ?  ?End of Session Equipment Utilized During Treatment: Gait belt ?Activity Tolerance: Patient limited by fatigue ?Patient left: in bed;with call bell/phone within reach;with bed alarm set ?Nurse Communication: Mobility status ?PT Visit Diagnosis: Unsteadiness on feet (R26.81);Muscle weakness (generalized) (M62.81);Difficulty in walking, not elsewhere classified (R26.2) ?  ? ?Time: 3817-7116 ?PT Time Calculation (min) (ACUTE ONLY): 12 min ? ? ?Charges:   PT Evaluation ?$PT Eval Low Complexity: 1 Low ?  ?  ?   ? ? ?[employer], PT, [employer] ?02/26/22,11:41 AM ? ?

## 2022-02-26 NOTE — Assessment & Plan Note (Signed)
Continue supplements

## 2022-02-26 NOTE — Assessment & Plan Note (Addendum)
Did well with physical therapy today ?

## 2022-02-26 NOTE — Assessment & Plan Note (Signed)
Follow-up Dr. Janese Banks oncology as outpatient.  Patient has liver metastases. ?

## 2022-02-26 NOTE — Progress Notes (Signed)
Nutrition Follow-up ? ?DOCUMENTATION CODES:  ? ?Severe malnutrition in context of chronic illness ? ?INTERVENTION:  ? ?-Continue Ensure Enlive po TID, each supplement provides 350 kcal and 20 grams of protein ?-Continue MVI with minerals daily ? ?NUTRITION DIAGNOSIS:  ? ?Severe Malnutrition related to chronic illness (colorectal cancer) as evidenced by moderate fat depletion, severe fat depletion, severe muscle depletion, percent weight loss. ? ?Ongoing ? ?GOAL:  ? ?Patient will meet greater than or equal to 90% of their needs ? ?Progressing  ? ?MONITOR:  ? ?PO intake, Supplement acceptance, Labs, Weight trends, Skin, I & O's ? ?REASON FOR ASSESSMENT:  ? ?Consult ?Assessment of nutrition requirement/status ? ?ASSESSMENT:  ? ?Patient is a 66 y.o. 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 ? ?Reviewed I/O's: +1 L x 24 hours ? ?UOP: 0 ml x 24 hours ? ?Pt very sleepy at time of visit. She awoke briefly when RD called name, but quickly fell asleep. Noted pt did not touch breakfast tray, but consumed a few sips of Ensure.   ? ?Per oncology notes, pt may be discharged in 1-2 days.  ? ?Reviewed wt hx; pt has experienced a 17% wt loss over the past 3 months, which is significant for time frame.  ? ?Medications reviewed.  ? ?Labs reviewed: Na: 133.  ? ?NUTRITION - FOCUSED PHYSICAL EXAM: ? ?Flowsheet Row Most Recent Value  ?Orbital Region Severe depletion  ?Upper Arm Region Severe depletion  ?Thoracic and Lumbar Region Severe depletion  ?Buccal Region Moderate depletion  ?Temple Region Severe depletion  ?Clavicle Bone Region Severe depletion  ?Clavicle and Acromion Bone Region Severe depletion  ?Scapular Bone Region Severe depletion  ?Dorsal Hand Severe depletion  ?Patellar Region Severe depletion  ?Anterior Thigh Region Severe depletion  ?Posterior Calf Region Severe depletion  ?Edema (RD Assessment) None  ?Hair Reviewed  ?Eyes  Reviewed  ?Mouth Reviewed  ?Skin Reviewed  ?Nails Reviewed  ? ?  ? ? ?Diet Order:   ?Diet Order   ? ?       ?  Diet regular Room service appropriate? Yes; Fluid consistency: Thin  Diet effective now       ?  ? ?  ?  ? ?  ? ? ?EDUCATION NEEDS:  ? ?No education needs have been identified at this time ? ?Skin:  Skin Assessment: Reviewed RN Assessment ? ?Last BM:  02/25/22 (via colostomy) ? ?Height:  ? ?Ht Readings from Last 1 Encounters:  ?02/25/22 '5\' 6"'$  (1.676 m)  ? ? ?Weight:  ? ?Wt Readings from Last 1 Encounters:  ?02/17/22 48.9 kg  ? ? ?Ideal Body Weight:  59.1 kg ? ?BMI:  Body mass index is 17.38 kg/m?. ? ?Estimated Nutritional Needs:  ? ?Kcal:  1750-1950 ? ?Protein:  95-110 grams ? ?Fluid:  > 1.7 L ? ? ? ?Loistine Chance, RD, LDN, CDCES ?Registered Dietitian II ?Certified Diabetes Care and Education Specialist ?Please refer to [facility] for RD and/or RD on-call/weekend/after hours pager  ?

## 2022-02-26 NOTE — Progress Notes (Signed)
?   02/26/22 0746  ?Assess: MEWS Score  ?Temp 98.9 ?F (37.2 ?C)  ?BP 100/78  ?Pulse Rate (!) 108  ?Resp 18  ?Level of Consciousness Alert  ?SpO2 100 %  ?O2 Device Room Air  ?Assess: MEWS Score  ?MEWS Temp 0  ?MEWS Systolic 1  ?MEWS Pulse 1  ?MEWS RR 0  ?MEWS LOC 0  ?MEWS Score 2  ?MEWS Score Color Yellow  ?Assess: if the MEWS score is Yellow or Red  ?Were vital signs taken at a resting state? Yes  ?Focused Assessment Change from prior assessment (see assessment flowsheet)  ?Does the patient meet 2 or more of the SIRS criteria? No  ?MEWS guidelines implemented *See Row Information* Yes  ?Treat  ?MEWS Interventions Escalated (See documentation below)  ?Take Vital Signs  ?Increase Vital Sign Frequency  Yellow: Q 2hr X 2 then Q 4hr X 2, if remains yellow, continue Q 4hrs  ?Escalate  ?MEWS: Escalate Yellow: discuss with charge nurse/RN and consider discussing with provider and RRT  ?Notify: Charge Nurse/RN  ?Name of Charge Nurse/RN Notified Ashely Rosendo Gros RN  ?Date Charge Nurse/RN Notified 02/26/22  ?Time Charge Nurse/RN Notified 0800  ?Document  ?Patient Outcome Other (Comment) ?(continue to monitor)  ?Progress note created (see row info) Yes  ?Assess: SIRS CRITERIA  ?SIRS Temperature  0  ?SIRS Pulse 1  ?SIRS Respirations  0  ?SIRS WBC 0  ?SIRS Score Sum  1  ? ? ?

## 2022-02-26 NOTE — Progress Notes (Signed)
?  Progress Note ? ? ?Patient: Alicia Coleman CNO:[id number] DOB: 18-Sep-1956 DOA: 02/25/2022     0 ?DOS: the patient was seen and examined on 02/26/2022 ?  ? ? ?Assessment and Plan: ?* Neutropenic fever ([place]) ?white blood cell count still low at 0.4.  Had a fever of 100.4 last night, tachycardic.  Unclear source of infection.  Blood cultures pending ordered urine analysis and urine culture.  Chest x-ray negative.  Empiric ceftazidime for right now.  Stool for C. difficile ordered. ? ?SIRS (systemic inflammatory response syndrome) (H[facility]) ?Present on admission.  Unclear source of infection but with neutropenia, fever and tachycardia will give empiric antibiotics with ceftazidime for now. ? ?Pancytopenia ([place]) ?With recent chemotherapy, appreciate oncology evaluation.  White blood cell count is still low at 0.4.  Hemoglobin down to 8.7.  Platelet count down to 61. ? ?Generalized weakness ?Physical therapy evaluation ? ?Protein-calorie malnutrition, severe ([place]) ?Continue supplements ? ?Metastatic colon cancer in female [facility]) ?Follow-up Dr. Janese Banks oncology as outpatient.  Patient has liver metastases. ? ? ? ? ?  ? ?Subjective: Patient does complain of some cramping in the abdomen.  Has not had any further diarrhea since coming in.  No cough or shortness of breath.  Had fever last night.  Feels weak.  Admitted with neutropenic fever ? ?Physical Exam: ?Vitals:  ? 02/26/22 0804 02/26/22 1005 02/26/22 1210 02/26/22 1300  ?BP:  108/87 96/77 109/78  ?Pulse: (!) 106 (!) 107 (!) 111 (!) 110  ?Resp:  18 18   ?Temp:  98.1 ?F (36.7 ?C) 98.4 ?F (36.9 ?C)   ?TempSrc:  Oral    ?SpO2: 100% 100% 100%   ?Height:      ? ?Physical Exam ?HENT:  ?   Head: Normocephalic.  ?   Mouth/Throat:  ?   Pharynx: No oropharyngeal exudate.  ?Eyes:  ?   General: Lids are normal.  ?   Conjunctiva/sclera: Conjunctivae normal.  ?Cardiovascular:  ?   Rate and Rhythm: Regular rhythm. Tachycardia present.  ?   Heart sounds: Normal heart sounds, S1 normal and S2 normal.   ?Pulmonary:  ?   Breath sounds: Normal breath sounds. No decreased breath sounds, wheezing, rhonchi or rales.  ?Abdominal:  ?   Palpations: Abdomen is soft.  ?   Tenderness: There is no abdominal tenderness.  ?Musculoskeletal:  ?   Right lower leg: No swelling.  ?   Left lower leg: No swelling.  ?Skin: ?   General: Skin is warm.  ?   Findings: No rash.  ?Neurological:  ?   Mental Status: She is alert and oriented to person, place, and time.  ?  ?Data Reviewed: ?Today's laboratory data reviewed by me ? ?Family Communication: Declined ? ?Disposition: ?Status is: Observation ?With neutropenic fever needs further watching in the hospital.  Antibiotic started today. ? ?Planned Discharge Destination: Home ? ? ?Author: ?Loletha Grayer, MD ?02/26/2022 2:17 PM ? ?For on call review [email or web address].  ?

## 2022-02-26 NOTE — Assessment & Plan Note (Addendum)
Patient afebrile, complete course of Cipro.  White blood cell count now up in the normal range. ?

## 2022-02-26 NOTE — Assessment & Plan Note (Addendum)
Secondary to recent chemotherapy.  White blood cell count 7.6.  Hemoglobin stable at 8.3.  Platelet count stable at 75.  Check counts with Dr. Janese Banks on follow-up appointment. ?

## 2022-02-26 NOTE — Assessment & Plan Note (Addendum)
Present on admission.  Unclear source of infection but with neutropenia, fever and tachycardia.  Both blood and urine cultures negative.  Chest x-ray negative.  Stool studies negative..  Empiric ceftazidime switched over to empiric Cipro and will complete course as outpatient. ?

## 2022-02-27 ENCOUNTER — Ambulatory Visit: Payer: BC Managed Care – PPO

## 2022-02-27 ENCOUNTER — Telehealth: Payer: Self-pay | Admitting: *Deleted

## 2022-02-27 DIAGNOSIS — Z66 Do not resuscitate: Secondary | ICD-10-CM | POA: Diagnosis present

## 2022-02-27 DIAGNOSIS — R6510 Systemic inflammatory response syndrome (SIRS) of non-infectious origin without acute organ dysfunction: Secondary | ICD-10-CM | POA: Diagnosis not present

## 2022-02-27 DIAGNOSIS — D709 Neutropenia, unspecified: Secondary | ICD-10-CM | POA: Diagnosis not present

## 2022-02-27 DIAGNOSIS — Z20822 Contact with and (suspected) exposure to covid-19: Secondary | ICD-10-CM | POA: Diagnosis present

## 2022-02-27 DIAGNOSIS — E860 Dehydration: Secondary | ICD-10-CM | POA: Diagnosis present

## 2022-02-27 DIAGNOSIS — Z8249 Family history of ischemic heart disease and other diseases of the circulatory system: Secondary | ICD-10-CM | POA: Diagnosis not present

## 2022-02-27 DIAGNOSIS — C19 Malignant neoplasm of rectosigmoid junction: Secondary | ICD-10-CM | POA: Diagnosis present

## 2022-02-27 DIAGNOSIS — E876 Hypokalemia: Secondary | ICD-10-CM

## 2022-02-27 DIAGNOSIS — Z933 Colostomy status: Secondary | ICD-10-CM | POA: Diagnosis not present

## 2022-02-27 DIAGNOSIS — Z823 Family history of stroke: Secondary | ICD-10-CM | POA: Diagnosis not present

## 2022-02-27 DIAGNOSIS — R5081 Fever presenting with conditions classified elsewhere: Secondary | ICD-10-CM | POA: Diagnosis present

## 2022-02-27 DIAGNOSIS — C189 Malignant neoplasm of colon, unspecified: Secondary | ICD-10-CM | POA: Diagnosis present

## 2022-02-27 DIAGNOSIS — R54 Age-related physical debility: Secondary | ICD-10-CM | POA: Diagnosis present

## 2022-02-27 DIAGNOSIS — Z809 Family history of malignant neoplasm, unspecified: Secondary | ICD-10-CM | POA: Diagnosis not present

## 2022-02-27 DIAGNOSIS — Z888 Allergy status to other drugs, medicaments and biological substances status: Secondary | ICD-10-CM | POA: Diagnosis not present

## 2022-02-27 DIAGNOSIS — C787 Secondary malignant neoplasm of liver and intrahepatic bile duct: Secondary | ICD-10-CM | POA: Diagnosis present

## 2022-02-27 DIAGNOSIS — R55 Syncope and collapse: Secondary | ICD-10-CM | POA: Diagnosis present

## 2022-02-27 DIAGNOSIS — E43 Unspecified severe protein-calorie malnutrition: Secondary | ICD-10-CM | POA: Diagnosis present

## 2022-02-27 DIAGNOSIS — T451X5A Adverse effect of antineoplastic and immunosuppressive drugs, initial encounter: Secondary | ICD-10-CM | POA: Diagnosis present

## 2022-02-27 DIAGNOSIS — D61818 Other pancytopenia: Secondary | ICD-10-CM | POA: Diagnosis not present

## 2022-02-27 LAB — COMPREHENSIVE METABOLIC PANEL
ALT: 9 U/L (ref 0–44)
AST: 11 U/L — ABNORMAL LOW (ref 15–41)
Albumin: 1.7 g/dL — ABNORMAL LOW (ref 3.5–5.0)
Alkaline Phosphatase: 68 U/L (ref 38–126)
Anion gap: 7 (ref 5–15)
BUN: 13 mg/dL (ref 8–23)
CO2: 24 mmol/L (ref 22–32)
Calcium: 8.1 mg/dL — ABNORMAL LOW (ref 8.9–10.3)
Chloride: 105 mmol/L (ref 98–111)
Creatinine, Ser: 0.48 mg/dL (ref 0.44–1.00)
GFR, Estimated: >60 mL/min (ref >60)
Glucose, Bld: 98 mg/dL (ref 70–99)
Potassium: 2.8 mmol/L — ABNORMAL LOW (ref 3.5–5.1)
Sodium: 136 mmol/L (ref 135–145)
Total Bilirubin: 0.5 mg/dL (ref 0.3–1.2)
Total Protein: 4.9 g/dL — ABNORMAL LOW (ref 6.5–8.1)

## 2022-02-27 LAB — CBC
HCT: 24.6 % — ABNORMAL LOW (ref 36.0–46.0)
Hemoglobin: 8.1 g/dL — ABNORMAL LOW (ref 12.0–15.0)
MCH: 28 pg (ref 26.0–34.0)
MCHC: 32.9 g/dL (ref 30.0–36.0)
MCV: 85.1 fL (ref 80.0–100.0)
Platelets: 75 10*3/uL — ABNORMAL LOW (ref 150–400)
RBC: 2.89 MIL/uL — ABNORMAL LOW (ref 3.87–5.11)
RDW: 13.9 % (ref 11.5–15.5)
WBC: 1.6 10*3/uL — ABNORMAL LOW (ref 4.0–10.5)
nRBC: 0 % (ref 0.0–0.2)

## 2022-02-27 LAB — MAGNESIUM: Magnesium: 2.2 mg/dL (ref 1.7–2.4)

## 2022-02-27 LAB — URINE CULTURE: Culture: NO GROWTH

## 2022-02-27 MED ORDER — POTASSIUM CHLORIDE CRYS ER 20 MEQ PO TBCR
40.0000 meq | EXTENDED_RELEASE_TABLET | Freq: Two times a day (BID) | ORAL | Status: DC
  Administered 2022-02-27 (×2): 40 meq via ORAL
  Filled 2022-02-27 (×2): qty 2

## 2022-02-27 MED ORDER — POTASSIUM CHLORIDE 10 MEQ/100ML IV SOLN
10.0000 meq | INTRAVENOUS | Status: AC
  Administered 2022-02-27 (×4): 10 meq via INTRAVENOUS
  Filled 2022-02-27 (×4): qty 100

## 2022-02-27 NOTE — Assessment & Plan Note (Addendum)
Potassium replaced into the normal range.  Continue oral potassium supplementation upon discharge ?

## 2022-02-27 NOTE — Progress Notes (Signed)
Physical Therapy Treatment ?Patient Details ?Name: Alicia Coleman ?MRN: [id number] ?DOB: Jan 14, 1956 ?Today's Date: 02/27/2022 ? ? ?History of Present Illness Alicia Coleman is a 66 y.o. female with medical history significant of metastatic colorectal adenocarcinoma with liver metastases and obstructive rectal lesion, s/p diverting palliative colostomy, anemia, GI bleeding, who presents with weakness. Neutropenic and enteric precautions. ? ?  ?PT Comments  ? ? Patient received in bed, she is agreeable to PT session. Patient reports she got up to use bathroom earlier. She is independent with bed mobility, transfers with supervision only. Ambulated 175 feet with IV pole. No physical assist needed. Good pace and endurance noted this session. She will continue to benefit from skilled PT while here to maximize independence and safety with mobility.  ?     ?Recommendations for follow up therapy are one component of a multi-disciplinary discharge planning process, led by the attending physician.  Recommendations may be updated based on patient status, additional functional criteria and insurance authorization. ? ?Follow Up Recommendations ? No PT follow up ?  ?  ?Assistance Recommended at Discharge Intermittent Supervision/Assistance  ?Patient can return home with the following Assist for transportation;Help with stairs or ramp for entrance ?  ?Equipment Recommendations ? Rolling walker (2 wheels)  ?  ?Recommendations for Other Services   ? ? ?  ?Precautions / Restrictions Precautions ?Precautions: Fall ?Restrictions ?Weight Bearing Restrictions: No  ?  ? ?Mobility ? Bed Mobility ?Overal bed mobility: Independent ?  ?  ?  ?  ?  ?  ?  ?  ? ?Transfers ?Overall transfer level: Independent ?Equipment used: None ?Transfers: Sit to/from Stand ?Sit to Stand: Independent ?  ?  ?  ?  ?  ?  ?  ? ?Ambulation/Gait ?Ambulation/Gait assistance: Supervision ?Gait Distance (Feet): 175 Feet ?Assistive device: IV Pole ?Gait Pattern/deviations:  Step-through pattern ?Gait velocity: WNL ?  ?  ?General Gait Details: patient ambulated around nurses station with IV pole. No assist needed. She has much improved strength and endurance this session. ? ? ?Stairs ?  ?  ?  ?  ?  ? ? ?Wheelchair Mobility ?  ? ?Modified Rankin (Stroke Patients Only) ?  ? ? ?  ?Balance Overall balance assessment: Modified Independent ?Sitting-balance support: Feet supported ?Sitting balance-Leahy Scale: Normal ?  ?  ?Standing balance support: Bilateral upper extremity supported, Single extremity supported, During functional activity ?Standing balance-Leahy Scale: Good ?Standing balance comment: pushed IV pole in hallway ?  ?  ?  ?  ?  ?  ?  ?  ?  ?  ?  ?  ? ?  ?Cognition Arousal/Alertness: Awake/alert ?Behavior During Therapy: [facility] for tasks assessed/performed ?Overall Cognitive Status: Within Functional Limits for tasks assessed ?  ?  ?  ?  ?  ?  ?  ?  ?  ?  ?  ?  ?  ?  ?  ?  ?  ?  ?  ? ?  ?Exercises   ? ?  ?General Comments   ?  ?  ? ?Pertinent Vitals/Pain Pain Assessment ?Pain Assessment: No/denies pain  ? ? ?Home Living   ?  ?  ?  ?  ?  ?  ?  ?  ?  ?   ?  ?Prior Function    ?  ?  ?   ? ?PT Goals (current goals can now be found in the care plan section) Acute Rehab PT Goals ?Patient Stated Goal: to feel better and return home ?PT  Goal Formulation: With patient ?Time For Goal Achievement: 03/12/22 ?Potential to Achieve Goals: Good ?Progress towards PT goals: Progressing toward goals ? ?  ?Frequency ? ? ? Min 2X/week ? ? ? ?  ?PT Plan Discharge plan needs to be updated  ? ? ?Co-evaluation   ?  ?  ?  ?  ? ?  ?AM-PAC PT "6 Clicks" Mobility   ?Outcome Measure ? Help needed turning from your back to your side while in a flat bed without using bedrails?: None ?Help needed moving from lying on your back to sitting on the side of a flat bed without using bedrails?: None ?Help needed moving to and from a bed to a chair (including a wheelchair)?: None ?Help needed standing up from a chair using  your arms (e.g., wheelchair or bedside chair)?: None ?Help needed to walk in hospital room?: A Little ?Help needed climbing 3-5 steps with a railing? : A Little ?6 Click Score: 22 ? ?  ?End of Session   ?Activity Tolerance: Patient tolerated treatment well ?Patient left: in bed;with call bell/phone within reach;with bed alarm set ?Nurse Communication: Mobility status ?PT Visit Diagnosis: Muscle weakness (generalized) (M62.81) ?  ? ? ?Time: 3154-0086 ?PT Time Calculation (min) (ACUTE ONLY): 14 min ? ?Charges:  $Gait Training: 8-22 mins          ?          ? ?[employer], PT, [employer] ?02/27/22,1:25 PM ? ?

## 2022-02-27 NOTE — Progress Notes (Signed)
PT Cancellation Note ? ?Patient Details ?Name: Alicia Coleman ?MRN: [id number] ?DOB: 05-02-1956 ? ? ?Cancelled Treatment:    Reason Eval/Treat Not Completed: Medical issues which prohibited therapy. Patient needs colostomy bag changed prior to ambulation. Will re-attempt later as time allows.    ? ? ?[name] ?02/27/2022, 11:56 AM ?

## 2022-02-27 NOTE — Telephone Encounter (Signed)
Patient gave Dr. Janese Banks FMLA forms for "Alicia Coleman" to be completed. Forms Completed and Signed by Dr. Janese Banks. ? ?I called the patient's cell- no answer. I called pt's room 114/1C. Pt answered. Pt will have family pick up original form at front reception desk. Copy of completed form sent to HIM to be scanned into her char.t ?

## 2022-02-27 NOTE — Telephone Encounter (Signed)
Patient gave the fmla papers to Dr. Janese Banks during her inpatient consult with patient. I spoke with patient already and her family will come and pick up the papers. ?

## 2022-02-27 NOTE — Progress Notes (Signed)
?Progress Note ? ? ?Patient: Alicia Coleman RWE:[id number] DOB: 1956-09-25 DOA: 02/25/2022     0 ?DOS: the patient was seen and examined on 02/27/2022 ?  ? ? ?Assessment and Plan: ?* Neutropenic fever ([place]) ?White blood cell count still low at came up to 1.6.  Had a fever of 100.4, 2 nights ago, tachycardic.  Unclear source of infection.  Blood cultures negative for 2 days.  Urine culture shows no growth.  Chest x-ray negative.  Empiric ceftazidime for right now.  Stool studies negative.  If no further fever tomorrow may be able to change to oral antibiotics on discharge. ? ?SIRS (systemic inflammatory response syndrome) (HCC) ?Present on admission.  Unclear source of infection but with neutropenia, fever and tachycardia.  So far cultures negative.  Empiric ceftazidime for now. ? ?Hypokalemia ?Potassium down to 2.8 today.  Discontinue IV fluids give 2 potassium rounds and potassium twice a day today.  Recheck potassium tomorrow.  If potassium in the normal range may be able to discharge tomorrow. ? ?Pancytopenia ([place]) ?With recent chemotherapy, appreciate oncology evaluation.  White blood cell count is still low at 1.6.  Hemoglobin down to 8.1 (with vigorous IV fluid hydration).  Platelet count up to 75 today. ? ?Generalized weakness ?Did well with physical therapy today ? ?Protein-calorie malnutrition, severe ([place]) ?Continue supplements ? ?Metastatic colon cancer in female [facility]) ?Follow-up Dr. Janese Banks oncology as outpatient.  Patient has liver metastases. ? ? ? ? ?  ? ?Subjective: Patient feeling a little bit better.  Admitted with weakness and Neutropenia and then developed fever.  Placed on empiric antibiotics.  Patient has poor appetite.  Found to have low potassium today ? ?Physical Exam: ?Vitals:  ? 02/27/22 0009 02/27/22 0555 02/27/22 0812 02/27/22 1553  ?BP: 108/83 114/86 105/78 110/84  ?Pulse: 100 91 89 98  ?Resp: '20 18 15 16  '$ ?Temp: 98.2 ?F (36.8 ?C) 98.1 ?F (36.7 ?C) 97.7 ?F (36.5 ?C) 98.3 ?F (36.8 ?C)  ?TempSrc:    Oral Oral  ?SpO2: 100% 100% 100% 100%  ?Weight:      ?Height:      ? ?Physical Exam ?HENT:  ?   Head: Normocephalic.  ?   Mouth/Throat:  ?   Pharynx: No oropharyngeal exudate.  ?Eyes:  ?   General: Lids are normal.  ?   Conjunctiva/sclera: Conjunctivae normal.  ?Cardiovascular:  ?   Rate and Rhythm: Normal rate and regular rhythm.  ?   Heart sounds: Normal heart sounds, S1 normal and S2 normal.  ?Pulmonary:  ?   Breath sounds: Normal breath sounds. No decreased breath sounds, wheezing, rhonchi or rales.  ?Abdominal:  ?   Palpations: Abdomen is soft.  ?   Tenderness: There is no abdominal tenderness.  ?Musculoskeletal:  ?   Right lower leg: No swelling.  ?   Left lower leg: No swelling.  ?Skin: ?   General: Skin is warm.  ?   Findings: No rash.  ?Neurological:  ?   Mental Status: She is alert and oriented to person, place, and time.  ?  ?Data Reviewed: ?Potassium down to 2.8.  Albumin low at 1.7.  White blood cell count 1.6, hemoglobin 8.1, platelet count 75 ? ?Family Communication: Declined me calling family ? ?Disposition: ?Status is: Inpatient ?Remains inpatient appropriate because: Patient's potassium is low.  Empiric IV antibiotics for neutropenic fever.  If no fever overnight into tomorrow and potassium in a better range may be able to discharge tomorrow. ? ?Planned Discharge Destination: Home ? ? ?  Author: ?Loletha Grayer, MD ?02/27/2022 4:15 PM ? ?For on call review [email or web address].  ?

## 2022-02-28 DIAGNOSIS — E876 Hypokalemia: Secondary | ICD-10-CM | POA: Diagnosis not present

## 2022-02-28 DIAGNOSIS — R6510 Systemic inflammatory response syndrome (SIRS) of non-infectious origin without acute organ dysfunction: Secondary | ICD-10-CM | POA: Diagnosis not present

## 2022-02-28 DIAGNOSIS — D709 Neutropenia, unspecified: Secondary | ICD-10-CM | POA: Diagnosis not present

## 2022-02-28 LAB — CBC
HCT: 25.3 % — ABNORMAL LOW (ref 36.0–46.0)
Hemoglobin: 8.3 g/dL — ABNORMAL LOW (ref 12.0–15.0)
MCH: 27.7 pg (ref 26.0–34.0)
MCHC: 32.8 g/dL (ref 30.0–36.0)
MCV: 84.3 fL (ref 80.0–100.0)
Platelets: 75 10*3/uL — ABNORMAL LOW (ref 150–400)
RBC: 3 MIL/uL — ABNORMAL LOW (ref 3.87–5.11)
RDW: 14.4 % (ref 11.5–15.5)
WBC: 7.6 10*3/uL (ref 4.0–10.5)
nRBC: 0 % (ref 0.0–0.2)

## 2022-02-28 LAB — POTASSIUM: Potassium: 4 mmol/L (ref 3.5–5.1)

## 2022-02-28 LAB — PHOSPHORUS
Phosphorus: <1 mg/dL — CL (ref 2.5–4.6)
Phosphorus: 3 mg/dL (ref 2.5–4.6)

## 2022-02-28 MED ORDER — K PHOS MONO-SOD PHOS DI & MONO 155-852-130 MG PO TABS: 500.0000 mg | ORAL_TABLET | Freq: Three times a day (TID) | ORAL | Status: DC

## 2022-02-28 MED ORDER — CIPROFLOXACIN HCL 500 MG PO TABS
500.0000 mg | ORAL_TABLET | Freq: Two times a day (BID) | ORAL | Status: DC
Start: 2022-02-28 — End: 2022-03-01
  Administered 2022-02-28 – 2022-03-01 (×3): 500 mg via ORAL
  Filled 2022-02-28 (×3): qty 1

## 2022-02-28 MED ORDER — ADULT MULTIVITAMIN W/MINERALS CH: 1.0000 | ORAL_TABLET | Freq: Every day | ORAL | 0 refills | Status: AC

## 2022-02-28 MED ORDER — POTASSIUM PHOSPHATES 15 MMOLE/5ML IV SOLN
45.0000 mmol | Freq: Once | INTRAVENOUS | Status: AC
  Administered 2022-02-28: 45 mmol via INTRAVENOUS
  Filled 2022-02-28: qty 15

## 2022-02-28 MED ORDER — K PHOS MONO-SOD PHOS DI & MONO 155-852-130 MG PO TABS
500.0000 mg | ORAL_TABLET | Freq: Three times a day (TID) | ORAL | Status: DC
  Administered 2022-02-28 – 2022-03-01 (×3): 500 mg via ORAL
  Filled 2022-02-28: qty 18
  Filled 2022-02-28: qty 2

## 2022-02-28 MED ORDER — LOPERAMIDE HCL 2 MG PO CAPS
2.0000 mg | ORAL_CAPSULE | Freq: Three times a day (TID) | ORAL | Status: DC | PRN
  Administered 2022-02-28: 2 mg via ORAL
  Filled 2022-02-28: qty 1

## 2022-02-28 MED ORDER — POTASSIUM PHOSPHATES 15 MMOLE/5ML IV SOLN
30.0000 mmol | Freq: Once | INTRAVENOUS | Status: DC
  Filled 2022-02-28: qty 10

## 2022-02-28 MED ORDER — ENSURE ENLIVE PO LIQD: 237.0000 mL | Freq: Three times a day (TID) | ORAL | 0 refills | Status: AC

## 2022-02-28 MED ORDER — CIPROFLOXACIN HCL 500 MG PO TABS: 500.0000 mg | ORAL_TABLET | Freq: Two times a day (BID) | ORAL | 0 refills | Status: AC

## 2022-02-28 NOTE — Assessment & Plan Note (Addendum)
Phosphorus low at less than 1.0 on 02/28/2022.  Received 45 mmol of K-Phos supplementation and phosphorus came out.  Today's phosphorus is 2.4 we will give samples from our pharmacy to go home with for phosphorus replacement. ?

## 2022-02-28 NOTE — Plan of Care (Signed)
Lab called with critical phosphorous of <1.  Reached out to NP on call and let her know.  ?

## 2022-02-28 NOTE — Progress Notes (Signed)
?  Progress Note ? ? ?Patient: Alicia Coleman EHU:[id number] DOB: 1956/07/08 DOA: 02/25/2022     1 ?DOS: the patient was seen and examined on 02/28/2022 ?  ? ? ?Assessment and Plan: ?* Neutropenic fever ([place]) ?Patient afebrile ceftazidime switched over to p.o. Cipro.  White blood cell count now up in the normal range. ? ?Hypophosphatemia ?Phosphorus low at less than 1.0.  Receiving 45 mmol of K-Phos IV currently.  We will check a phosphorus after infusion is done and then determine on whether she is good enough to get out of the hospital or needs more replacement. ? ?SIRS (systemic inflammatory response syndrome) (HCC) ?Present on admission.  Unclear source of infection but with neutropenia, fever and tachycardia.  So far cultures negative.  Empiric ceftazidime switched over to empiric Cipro ? ?Hypokalemia ?Potassium replaced into the normal range.  Continue oral potassium supplementation upon discharge ? ?Pancytopenia ([place]) ?Secondary to recent chemotherapy.  White blood cell count 7.6.  Hemoglobin stable at 8.3.  Platelet count stable at 75. ? ?Generalized weakness ?Did well with physical therapy today ? ?Protein-calorie malnutrition, severe ([place]) ?Continue supplements ? ?Primary malignant neoplasm of colon with metastasis in female [facility]) ?Follow-up Dr. Janese Banks oncology as outpatient.  Patient has liver metastases. ? ? ? ? ?  ? ?Subjective: Patient feeling a little bit better.  Still with poor appetite.  This morning her phosphorus was low.  Admitted with diarrhea and then developed neutropenic fever ? ?Physical Exam: ?Vitals:  ? 02/28/22 0024 02/28/22 0456 02/28/22 0750 02/28/22 1213  ?BP: 109/87 102/79 108/69 113/74  ?Pulse: (!) 103 96 97 95  ?Resp: '18 20 18 16  '$ ?Temp: 98.3 ?F (36.8 ?C) 97.6 ?F (36.4 ?C) 98.6 ?F (37 ?C) 98.1 ?F (36.7 ?C)  ?TempSrc: Oral     ?SpO2: 100% 100% 100% 100%  ?Weight:      ?Height:      ? ?Physical Exam ?HENT:  ?   Head: Normocephalic.  ?   Mouth/Throat:  ?   Pharynx: No oropharyngeal exudate.   ?Eyes:  ?   General: Lids are normal.  ?   Conjunctiva/sclera: Conjunctivae normal.  ?Cardiovascular:  ?   Rate and Rhythm: Normal rate and regular rhythm.  ?   Heart sounds: Normal heart sounds, S1 normal and S2 normal.  ?Pulmonary:  ?   Breath sounds: Normal breath sounds. No decreased breath sounds, wheezing, rhonchi or rales.  ?Abdominal:  ?   Palpations: Abdomen is soft.  ?   Tenderness: There is no abdominal tenderness.  ?Musculoskeletal:  ?   Right lower leg: No swelling.  ?   Left lower leg: No swelling.  ?Skin: ?   General: Skin is warm.  ?   Findings: No rash.  ?Neurological:  ?   Mental Status: She is alert and oriented to person, place, and time.  ?  ?Data Reviewed: ?Plan pleasant count normal range at 7.6, hemoglobin 8.3, platelet count 75, potassium 4.0, phosphorus less than 1 ? ?Family Communication: Declined ? ?Disposition: ?Status is: Inpatient ?Remains inpatient appropriate because: Replacing IV phosphorus today.  We will recheck level after infusion done ? ?Planned Discharge Destination: Home ? ? ?Author: ?Loletha Grayer, MD ?02/28/2022 3:59 PM ? ?For on call review [email or web address].  ?

## 2022-03-01 DIAGNOSIS — D709 Neutropenia, unspecified: Secondary | ICD-10-CM | POA: Diagnosis not present

## 2022-03-01 DIAGNOSIS — E876 Hypokalemia: Secondary | ICD-10-CM | POA: Diagnosis not present

## 2022-03-01 DIAGNOSIS — R6510 Systemic inflammatory response syndrome (SIRS) of non-infectious origin without acute organ dysfunction: Secondary | ICD-10-CM | POA: Diagnosis not present

## 2022-03-01 LAB — BASIC METABOLIC PANEL
Anion gap: 7 (ref 5–15)
BUN: 12 mg/dL (ref 8–23)
CO2: 24 mmol/L (ref 22–32)
Calcium: 8.6 mg/dL — ABNORMAL LOW (ref 8.9–10.3)
Chloride: 106 mmol/L (ref 98–111)
Creatinine, Ser: 0.53 mg/dL (ref 0.44–1.00)
GFR, Estimated: >60 mL/min (ref >60)
Glucose, Bld: 104 mg/dL — ABNORMAL HIGH (ref 70–99)
Potassium: 3.7 mmol/L (ref 3.5–5.1)
Sodium: 137 mmol/L (ref 135–145)

## 2022-03-01 LAB — PHOSPHORUS: Phosphorus: 2.4 mg/dL — ABNORMAL LOW (ref 2.5–4.6)

## 2022-03-01 NOTE — TOC Transition Note (Addendum)
Transition of Care (TOC) - CM/SW Discharge Note ? ? ?Patient Details  ?Name: Alicia Coleman ?MRN: [id number] ?Date of Birth: 08-19-1956 ? ?Transition of Care (TOC) CM/SW Contact:  ?Bobbie Virden E Jeanett Antonopoulos, LCSW ?Phone Number: ?03/01/2022, 10:21 AM ? ? ?Clinical Narrative:    ?Patient to DC home today. Patient needs RW.  ?Confirmed with Jasmine with [employer] and delivered RW from [place] to bedside so patient does not have to wait for delivery. Patient signed delivery ticket which was placed in [place] for [employer] to pick up Monday.  ?Patient stated her sister will pick her up today when DC and will be there to help her at home.  ?Patient denied additional needs. ? ? ?Final next level of care: Home/Self Care ?Barriers to Discharge: Barriers Resolved ? ? ?Patient Goals and CMS Choice ?Patient states their goals for this hospitalization and ongoing recovery are:: home with sister support ?CMS Medicare.gov Compare Post Acute Care list provided to:: Patient ?Choice offered to / list presented to : Patient ? ?Discharge Placement ?  ?           ?  ?Patient to be transferred to facility by: sister to pick up ?Name of family member notified: patient notified ?Patient and family notified of of transfer: 03/01/22 ? ?Discharge Plan and Services ?  ?  ?           ?DME Arranged: Walker rolling ?DME Agency: [employer] ?Date DME Agency Contacted: 03/01/22 ?  ?Representative spoke with at DME Agency: Delana Meyer ?  ?  ?  ?  ?  ? ?Social Determinants of Health (SDOH) Interventions ?  ? ? ?Readmission Risk Interventions ?   ? View : No data to display.  ?  ?  ?  ? ? ? ? ? ?

## 2022-03-01 NOTE — Discharge Summary (Signed)
?Physician Discharge Summary ?  ?Patient: Alicia Coleman MRN: [id number] DOB: 1956/08/31  ?Admit date:     02/25/2022  ?Discharge date: 03/01/22  ?Discharge Physician: Loletha Grayer  ? ?PCP: Pcp, No  ? ?Recommendations at discharge:  ? ?Follow-up Dr. Janese Banks as outpatient ? ?Discharge Diagnoses: ?Principal Problem: ?  Neutropenic fever ([place]) ?Active Problems: ?  SIRS (systemic inflammatory response syndrome) (HCC) ?  Hypophosphatemia ?  Hypokalemia ?  Pancytopenia ([place]) ?  Primary malignant neoplasm of colon with metastasis in female [facility]) ?  Protein-calorie malnutrition, severe ([place]) ?  Generalized weakness ?  Diarrhea ? ? ?Hospital Course: ?The patient was admitted to the hospital on 02/25/2022 and discharged on 03/01/2022.  Patient initially came in with weakness and diarrhea.  She has a history of metastatic colorectal adenocarcinoma with liver metastases.  She is undergoing chemotherapy as per Dr. Janese Banks.  She had neutropenia with a white count of 0.4 and developed a fever of 100.4 earlier in the hospital course.  She was started on empiric ceftazidime.  Cultures were negative.  Chest x-ray negative.  Stool for C. difficile was negative.  The patient improved during the hospital course.  Her white count came up to 7.6.  She was afebrile.  Ceftazidime was switched over to p.o. Cipro to complete a course as outpatient.  We also replace potassium during the hospital course.  Her potassium was 2.8 on 02/27/2022.  It is 3.7 upon discharge.  On 02/28/2022 her phosphorus was very low and that was replaced with IV K-Phos.  She will be given Neutra-Phos orally upon disposition supplied by our pharmacy. ? ?Assessment and Plan: ?* Neutropenic fever ([place]) ?Patient afebrile, complete course of Cipro.  White blood cell count now up in the normal range. ? ?Hypophosphatemia ?Phosphorus low at less than 1.0 on 02/28/2022.  Received 45 mmol of K-Phos supplementation and phosphorus came out.  Today's phosphorus is 2.4 we will give samples from  our pharmacy to go home with for phosphorus replacement. ? ?SIRS (systemic inflammatory response syndrome) (HCC) ?Present on admission.  Unclear source of infection but with neutropenia, fever and tachycardia.  Both blood and urine cultures negative.  Chest x-ray negative.  Stool studies negative..  Empiric ceftazidime switched over to empiric Cipro and will complete course as outpatient. ? ?Hypokalemia ?Potassium replaced into the normal range.  Continue oral potassium supplementation upon discharge ? ?Pancytopenia ([place]) ?Secondary to recent chemotherapy.  White blood cell count 7.6.  Hemoglobin stable at 8.3.  Platelet count stable at 75.  Check counts with Dr. Janese Banks on follow-up appointment. ? ?Generalized weakness ?Did well with physical therapy today ? ?Protein-calorie malnutrition, severe ([place]) ?Continue supplements ? ?Primary malignant neoplasm of colon with metastasis in female [facility]) ?Follow-up Dr. Janese Banks oncology as outpatient.  Patient has liver metastases. ? ? ? ? ?  ? ? ?Consultants: Oncology ?Procedures performed: None ?Disposition: Home ?Diet recommendation:  ?Regular diet ?DISCHARGE MEDICATION: ?Allergies as of 03/01/2022   ? ?   Reactions  ? Oxaliplatin Nausea And Vomiting  ? ?  ? ?  ?Medication List  ?  ? ?TAKE these medications   ? ?acetaminophen 325 MG tablet ?Commonly known as: TYLENOL ?Take 650 mg by mouth every 6 (six) hours as needed. ?  ?ciprofloxacin 500 MG tablet ?Commonly known as: CIPRO ?Take 1 tablet (500 mg total) by mouth 2 (two) times daily for 4 days. ?  ?feeding supplement Liqd ?Take 237 mLs by mouth 3 (three) times daily between meals. ?  ?lidocaine-prilocaine cream ?Commonly  known as: EMLA ?Apply 1 application. topically as needed. Apply to port and cover with saran wrap 1-2 hours prior to port access ?  ?multivitamin with minerals Tabs tablet ?Take 1 tablet by mouth daily. ?  ?phosphorus 155-852-130 MG tablet ?Commonly known as: K PHOS NEUTRAL ?Take 2 tablets (500 mg total) by mouth 3  (three) times daily. ?  ?potassium chloride SA 20 MEQ tablet ?Commonly known as: KLOR-CON M ?Take 20 mEq by mouth daily. ?  ? ?  ? ?  ?  ? ? ?  ?Durable Medical Equipment  ?(From admission, onward)  ?  ? ? ?  ? ?  Start     Ordered  ? 03/01/22 1109  For home use only DME Walker rolling  Once       ?Question Answer Comment  ?Walker: With 5 Inch Wheels   ?Patient needs a walker to treat with the following condition Unsteady gait when walking   ?  ? 03/01/22 1108  ? ?  ?  ? ?  ? ? Follow-up Information   ? ? Sindy Guadeloupe, MD Follow up in 1 week(s).   ?Specialty: Oncology ?Why: Office closed at this time patient to make own follow up appt ?Contact information: ?[place] [address] ?[phone] ? ? ?  ?  ? ? your medical doctor Follow up in 5 day(s).   ?Why: Office closed at this time patient to make own follow up appt ? ?  ?  ? ?  ?  ? ?  ? ?Discharge Exam: ?Filed Weights  ? 02/26/22 1600  ?Weight: 46.8 kg  ? ?Physical Exam ?HENT:  ?   Head: Normocephalic.  ?   Mouth/Throat:  ?   Pharynx: No oropharyngeal exudate.  ?Eyes:  ?   General: Lids are normal.  ?   Conjunctiva/sclera: Conjunctivae normal.  ?Cardiovascular:  ?   Rate and Rhythm: Normal rate and regular rhythm.  ?   Heart sounds: Normal heart sounds, S1 normal and S2 normal.  ?Pulmonary:  ?   Breath sounds: Normal breath sounds. No decreased breath sounds, wheezing, rhonchi or rales.  ?Abdominal:  ?   Palpations: Abdomen is soft.  ?   Tenderness: There is no abdominal tenderness.  ?Musculoskeletal:  ?   Right lower leg: No swelling.  ?   Left lower leg: No swelling.  ?Skin: ?   General: Skin is warm.  ?   Findings: No rash.  ?Neurological:  ?   Mental Status: She is alert and oriented to person, place, and time.  ?  ? ?Condition at discharge: fair ? ?The results of significant diagnostics from this hospitalization (including imaging, microbiology, ancillary and laboratory) are listed below for reference.  ? ?Imaging Studies: ?DG Chest  Port 1 View ? ?Result Date: 02/25/2022 ?CLINICAL DATA:  Increased weakness EXAM: PORTABLE CHEST 1 VIEW COMPARISON:  Radiograph May 01, 2021 FINDINGS: Left chest wall Port-A-Cath with tip overlying the superior cavoatrial confluence. The heart size and mediastinal contours are within normal limits. No focal airspace consolidation. No visible pleural effusion or pneumothorax. The visualized skeletal structures are unremarkable. IMPRESSION: No acute cardiopulmonary findings. Electronically Signed   By: Dahlia Bailiff M.D.   On: 02/25/2022 13:14   ? ?Microbiology: ?Results for orders placed or performed during the hospital encounter of 02/25/22  ?Resp Panel by RT-PCR (Flu A&B, Covid) Nasopharyngeal Swab     Status: None  ? Collection Time: 02/25/22 12:44 PM  ? Specimen: Nasopharyngeal Swab;  Nasopharyngeal(NP) swabs in vial transport medium  ?Result Value Ref Range Status  ? SARS Coronavirus 2 by RT PCR NEGATIVE NEGATIVE Final  ?  Comment: (NOTE) ?SARS-CoV-2 target nucleic acids are NOT DETECTED. ? ?The SARS-CoV-2 RNA is generally detectable in upper respiratory ?specimens during the acute phase of infection. The lowest ?concentration of SARS-CoV-2 viral copies this assay can detect is ?138 copies/mL. A negative result does not preclude SARS-Cov-2 ?infection and should not be used as the sole basis for treatment or ?other patient management decisions. A negative result may occur with  ?improper specimen collection/handling, submission of specimen other ?than nasopharyngeal swab, presence of viral mutation(s) within the ?areas targeted by this assay, and inadequate number of viral ?copies(<138 copies/mL). A negative result must be combined with ?clinical observations, patient history, and epidemiological ?information. The expected result is Negative. ? ?Fact Sheet for Patients:  ?EntrepreneurPulse.com.au ? ?Fact Sheet for Healthcare Providers:  ?IncredibleEmployment.be ? ?This test is no  t yet approved or cleared by the [place] FDA and  ?has been authorized for detection and/or diagnosis of SARS-CoV-2 by ?FDA under an Emergency Use Authorization (EUA). This EUA will remain  ?in effect

## 2022-03-02 ENCOUNTER — Ambulatory Visit: Payer: BC Managed Care – PPO

## 2022-03-02 LAB — CULTURE, BLOOD (ROUTINE X 2)
Culture: NO GROWTH
Culture: NO GROWTH
Special Requests: ADEQUATE

## 2022-03-02 MED FILL — Fluorouracil IV Soln 5 GM/100ML (50 MG/ML): INTRAVENOUS | Qty: 72 | Status: AC

## 2022-03-02 MED FILL — Fluorouracil IV Soln 2.5 GM/50ML (50 MG/ML): INTRAVENOUS | Qty: 12 | Status: AC

## 2022-03-03 ENCOUNTER — Inpatient Hospital Stay (HOSPITAL_BASED_OUTPATIENT_CLINIC_OR_DEPARTMENT_OTHER): Payer: BC Managed Care – PPO | Admitting: Oncology

## 2022-03-03 ENCOUNTER — Ambulatory Visit: Payer: BC Managed Care – PPO

## 2022-03-03 ENCOUNTER — Ambulatory Visit: Admission: RE | Admit: 2022-03-03 | Payer: BC Managed Care – PPO | Source: Ambulatory Visit

## 2022-03-03 ENCOUNTER — Encounter: Payer: Self-pay | Admitting: Oncology

## 2022-03-03 ENCOUNTER — Inpatient Hospital Stay: Payer: BC Managed Care – PPO

## 2022-03-03 ENCOUNTER — Inpatient Hospital Stay: Payer: BC Managed Care – PPO | Attending: Oncology

## 2022-03-03 VITALS — BP 116/83 | HR 108 | Temp 98.3°F | Resp 16 | Ht 66.0 in | Wt 104.5 lb

## 2022-03-03 DIAGNOSIS — R609 Edema, unspecified: Secondary | ICD-10-CM | POA: Insufficient documentation

## 2022-03-03 DIAGNOSIS — C187 Malignant neoplasm of sigmoid colon: Secondary | ICD-10-CM | POA: Diagnosis present

## 2022-03-03 DIAGNOSIS — C189 Malignant neoplasm of colon, unspecified: Secondary | ICD-10-CM | POA: Diagnosis not present

## 2022-03-03 DIAGNOSIS — R634 Abnormal weight loss: Secondary | ICD-10-CM | POA: Diagnosis not present

## 2022-03-03 DIAGNOSIS — C787 Secondary malignant neoplasm of liver and intrahepatic bile duct: Secondary | ICD-10-CM | POA: Insufficient documentation

## 2022-03-03 DIAGNOSIS — Z79899 Other long term (current) drug therapy: Secondary | ICD-10-CM | POA: Diagnosis not present

## 2022-03-03 DIAGNOSIS — Z7189 Other specified counseling: Secondary | ICD-10-CM

## 2022-03-03 MED ORDER — PREDNISONE 10 MG PO TABS: 10.0000 mg | ORAL_TABLET | Freq: Every day | ORAL | 0 refills | Status: AC

## 2022-03-03 NOTE — Progress Notes (Signed)
Nutrition ? ?RD was scheduled to see patient during infusion today but infusion cancelled.  Will send message to scheduling to get patient rescheduled.   ? ?Morrill Bomkamp B. Zenia Resides, RD, LDN ?Registered Dietitian ?[phone] ? ?

## 2022-03-03 NOTE — Progress Notes (Signed)
Pt has 50 % of reg eating and she has an appt Joli today. She has  been drinking ensure 3 times a day and it bothers her  stomach. She has numbness of feet bilateral and feels cold, her bil. fingers are stinging and cold. ?

## 2022-03-04 ENCOUNTER — Ambulatory Visit: Payer: BC Managed Care – PPO

## 2022-03-04 ENCOUNTER — Other Ambulatory Visit: Payer: Self-pay | Admitting: *Deleted

## 2022-03-04 DIAGNOSIS — C189 Malignant neoplasm of colon, unspecified: Secondary | ICD-10-CM

## 2022-03-05 ENCOUNTER — Ambulatory Visit: Payer: BC Managed Care – PPO

## 2022-03-05 NOTE — Progress Notes (Signed)
? ? ? ?Hematology/Oncology Consult note ?[place]  ?Telephone:(336) [phone] [phone] ? ?Patient Care Team: ?Pcp, No as PCP - General ?Clent Jacks, RN as Oncology Nurse Navigator ?Sindy Guadeloupe, MD as Consulting Physician (Oncology) ?[name], MD as Consulting Physician (Gastroenterology) ?Jules Husbands, MD as Consulting Physician (General Surgery)  ? ?Name of the patient: Alicia Coleman  ?[id number]  ?Jul 21, 1956  ? ?Date of visit: 03/05/22 ? ?Diagnosis-  metastatic colon cancer with liver metastases ? ?Chief complaint/ Reason for visit- discuss goals of care ? ?Heme/Onc history: Patient is a 66 year old female who was referred from the ER for severe iron deficiency anemia.  She received IV Venofer for that.  I was concerned about malignancy given the degree of anemia and therefore obtain CT abdomen and pelvis with contrast CT showed 6.4 cm enhancing mass in the right liver and another mass measuring 4 cm.  Large volume stool in the right colon transverse and descending colon with irregular mass lesion in the distal sigmoid colon extending into the rectosigmoid junction with lateral extracolonic extension into the pelvic sidewall.  Apparent luminal narrowing at the level of the lesion which accounts for large volume of stool proximally.  Multiple pericolonic and perirectal lymph nodes.  Multiple peritoneal nodules are seen in the lower right pelvic sidewall.  Colon mass appears to be invading posterior uterus.Thrombosis of the superior rectal vein may be bland thrombus although direct tumor invasion not excluded. ?  ?Patient had a flexible sigmoidoscopy which showed a large obstructing mass in the rectosigmoid colon.  Scope could not be traversed beyond the obstruction.  Biopsies were taken and were consistent with adenocarcinoma. Patient had a diverting colostomy on 05/01/2021 with Dr. Dahlia Byes ?  ?Patient started palliative FOLFOX chemotherapy in June 2022 with plans to add  Avastin after 6 cycles of treatment.  Patient developed allergic reaction to oxaliplatin with cycle 10 as evidenced by diaphoresis vomiting and flushing.  Plan is to hold off on further doses of oxaliplatin.  She has K-ras mutation positive ?  ?Progression noted on scans in January 2023.  Plan to switch to FOLFIRI bevacizumab ?  ? ?Interval history-patient is doing overall quite poorly.  Appetite is poor and energy levels are down.  Denies any significant pain or rectal bleeding. ? ?ECOG PS- 3 ?Pain scale- 0 ? ? ?Review of systems- Review of Systems  ?Constitutional:  Positive for malaise/fatigue and weight loss. Negative for chills and fever.  ?     Lack of appetite  ?HENT:  Negative for congestion, ear discharge and nosebleeds.   ?Eyes:  Negative for blurred vision.  ?Respiratory:  Negative for cough, hemoptysis, sputum production, shortness of breath and wheezing.   ?Cardiovascular:  Negative for chest pain, palpitations, orthopnea and claudication.  ?Gastrointestinal:  Negative for abdominal pain, blood in stool, constipation, diarrhea, heartburn, melena, nausea and vomiting.  ?Genitourinary:  Negative for dysuria, flank pain, frequency, hematuria and urgency.  ?Musculoskeletal:  Negative for back pain, joint pain and myalgias.  ?Skin:  Negative for rash.  ?Neurological:  Negative for dizziness, tingling, focal weakness, seizures, weakness and headaches.  ?Endo/Heme/Allergies:  Does not bruise/bleed easily.  ?Psychiatric/Behavioral:  Negative for depression and suicidal ideas. The patient does not have insomnia.    ? ? ?Allergies  ?Allergen Reactions  ? Oxaliplatin Nausea And Vomiting  ? ? ? ?Past Medical History:  ?Diagnosis Date  ? Allergy   ? Anemia   ? Cancer [facility])   ? Colon cancer ([place])   ?  GI bleed   ? ? ? ?Past Surgical History:  ?Procedure Laterality Date  ? COLONOSCOPY WITH PROPOFOL N/A 01/16/2022  ? Procedure: COLONOSCOPY WITH PROPOFOL;  Surgeon: [name], MD;  Location: [facility] ENDOSCOPY;  Service:  Gastroenterology;  Laterality: N/A;  ? ESOPHAGOGASTRODUODENOSCOPY  01/16/2022  ? Procedure: ESOPHAGOGASTRODUODENOSCOPY (EGD);  Surgeon: [name], MD;  Location: [facility] ENDOSCOPY;  Service: Gastroenterology;;  ? [place] N/A 04/24/2021  ? Procedure: FLEXIBLE SIGMOIDOSCOPY;  Surgeon: [name], MD;  Location: [facility] ENDOSCOPY;  Service: Gastroenterology;  Laterality: N/A;  ? IR CV LINE INJECTION  11/13/2021  ? IR IMAGING GUIDED PORT INSERTION  11/19/2021  ? IR REMOVAL TUN ACCESS W/ PORT W/O FL MOD SED  11/19/2021  ? PORTACATH PLACEMENT N/A 05/01/2021  ? Procedure: INSERTION PORT-A-CATH;  Surgeon: Jules Husbands, MD;  Location: [facility] ORS;  Service: General;  Laterality: N/A;  ? ? ?Social History  ? ?Socioeconomic History  ? Marital status: Married  ?  Spouse name: Not on file  ? Number of children: Not on file  ? Years of education: Not on file  ? Highest education level: Not on file  ?Occupational History  ? Not on file  ?Tobacco Use  ? Smoking status: Never  ? Smokeless tobacco: Never  ?Vaping Use  ? Vaping Use: Never used  ?Substance and Sexual Activity  ? Alcohol use: Not Currently  ? Drug use: Never  ? Sexual activity: Not Currently  ?Other Topics Concern  ? Not on file  ?Social History Narrative  ? Not on file  ? ?Social Determinants of Health  ? ?Financial Resource Strain: Not on file  ?Food Insecurity: Not on file  ?Transportation Needs: Not on file  ?Physical Activity: Not on file  ?Stress: Not on file  ?Social Connections: Not on file  ?Intimate Partner Violence: Not on file  ? ? ?Family History  ?Problem Relation Age of Onset  ? Stroke Mother   ? Hypertension Mother   ? Cancer Father   ? ? ? ?Current Outpatient Medications:  ?  acetaminophen (TYLENOL) 325 MG tablet, Take 650 mg by mouth every 6 (six) hours as needed., Disp: , Rfl:  ?  feeding supplement (ENSURE ENLIVE / ENSURE PLUS) LIQD, Take 237 mLs by mouth 3 (three) times daily between meals., Disp: 14220 mL, Rfl: 0 ?  lidocaine-prilocaine  (EMLA) cream, Apply 1 application. topically as needed. Apply to port and cover with saran wrap 1-2 hours prior to port access, Disp: 30 g, Rfl: 4 ?  potassium chloride SA (KLOR-CON M) 20 MEQ tablet, Take 20 mEq by mouth daily., Disp: , Rfl:  ?  predniSONE (DELTASONE) 10 MG tablet, Take 1 tablet (10 mg total) by mouth daily with breakfast., Disp: 14 tablet, Rfl: 0 ?  Multiple Vitamin (MULTIVITAMIN WITH MINERALS) TABS tablet, Take 1 tablet by mouth daily. (Patient not taking: Reported on 03/03/2022), Disp: 30 tablet, Rfl: 0 ?  phosphorus (K PHOS NEUTRAL) 155-852-130 MG tablet, Take 2 tablets (500 mg total) by mouth 3 (three) times daily. (Patient not taking: Reported on 03/03/2022), Disp: , Rfl:  ? ?Physical exam:  ?Vitals:  ? 03/03/22 0928  ?BP: 116/83  ?Pulse: (!) 108  ?Resp: 16  ?Temp: 98.3 ?F (36.8 ?C)  ?TempSrc: Oral  ?Weight: 104 lb 8 oz (47.4 kg)  ?Height: '5\' 6"'  (1.676 m)  ? ?Physical Exam ?Constitutional:   ?   General: She is not in acute distress. ?   Comments: She appears fatigued and cachectic sitting in a wheelchair  ?  Cardiovascular:  ?   Rate and Rhythm: Normal rate and regular rhythm.  ?   Heart sounds: Normal heart sounds.  ?Pulmonary:  ?   Effort: Pulmonary effort is normal.  ?   Breath sounds: Normal breath sounds.  ?Abdominal:  ?   General: Bowel sounds are normal.  ?   Palpations: Abdomen is soft.  ?   Comments: Colostomy in place  ?Skin: ?   General: Skin is warm and dry.  ?Neurological:  ?   Mental Status: She is alert and oriented to person, place, and time.  ?  ? ? ?  Latest Ref Rng & Units 03/01/2022  ?  4:17 AM  ?CMP  ?Glucose 70 - 99 mg/dL 104    ?BUN 8 - 23 mg/dL 12    ?Creatinine 0.44 - 1.00 mg/dL 0.53    ?Sodium 135 - 145 mmol/L 137    ?Potassium 3.5 - 5.1 mmol/L 3.7    ?Chloride 98 - 111 mmol/L 106    ?CO2 22 - 32 mmol/L 24    ?Calcium 8.9 - 10.3 mg/dL 8.6    ? ? ?  Latest Ref Rng & Units 02/28/2022  ?  5:57 AM  ?CBC  ?WBC 4.0 - 10.5 K/uL 7.6    ?Hemoglobin 12.0 - 15.0 g/dL 8.3    ?Hematocrit  36.0 - 46.0 % 25.3    ?Platelets 150 - 400 K/uL 75    ? ? ?No images are attached to the encounter. ? ?DG Chest Port 1 View ? ?Result Date: 02/25/2022 ?CLINICAL DATA:  Increased weakness EXAM: PORTABLE CHEST 1 VIE

## 2022-03-06 ENCOUNTER — Encounter: Payer: Self-pay | Admitting: Oncology

## 2022-03-06 ENCOUNTER — Ambulatory Visit: Payer: BC Managed Care – PPO

## 2022-03-09 ENCOUNTER — Ambulatory Visit: Payer: BC Managed Care – PPO

## 2022-03-10 ENCOUNTER — Ambulatory Visit: Payer: BC Managed Care – PPO

## 2022-03-11 ENCOUNTER — Ambulatory Visit: Payer: BC Managed Care – PPO

## 2022-03-11 ENCOUNTER — Inpatient Hospital Stay (HOSPITAL_BASED_OUTPATIENT_CLINIC_OR_DEPARTMENT_OTHER): Payer: BC Managed Care – PPO | Admitting: Hospice and Palliative Medicine

## 2022-03-11 VITALS — BP 141/73 | HR 62 | Temp 97.2°F | Resp 16

## 2022-03-11 DIAGNOSIS — C189 Malignant neoplasm of colon, unspecified: Secondary | ICD-10-CM

## 2022-03-11 DIAGNOSIS — C187 Malignant neoplasm of sigmoid colon: Secondary | ICD-10-CM | POA: Diagnosis not present

## 2022-03-11 NOTE — Progress Notes (Addendum)
Palliative care visit. Pt has concerns about gas and bilateral lower edema.  ? ?Later at the end of the day I faxed the FMLA and papers for work for the pt's sister to come from [place] and stay with the patient for a month. The forms was sent to the fax number on the FMLA. There was conformation that fax went through ?

## 2022-03-11 NOTE — Progress Notes (Signed)
? ?  ?Palliative Medicine ?[place] at [facility] ?Telephone:(336) [phone] [phone]) [phone] ? ? ?Name: Alicia Coleman ?Date: 03/11/2022 ?MRN: [id number]  ?DOB: [birth year] ? ?Patient Care Team: ?Pcp, No as PCP - General ?Clent Jacks, RN as Oncology Nurse Navigator ?Sindy Guadeloupe, MD as Consulting Physician (Oncology) ?[name], MD as Consulting Physician (Gastroenterology) ?Jules Husbands, MD as Consulting Physician (General Surgery)  ? ? ?REASON FOR CONSULTATION: ?Alicia Coleman is a 66 y.o. female with multiple medical problems including metastatic colon cancer with liver metastasis started on FOLFOX chemotherapy in June 2022 with disease progression noted in January 2023 and patient was rotated to FOLFIRI plus Bev.  CTs in February 2023 are concerning for disease progression.  Patient has had declining performance status.  She is referred to palliative care to help address goals. ? ?SOCIAL HISTORY:    ? reports that she has never smoked. She has never used smokeless tobacco. She reports that she does not currently use alcohol. She reports that she does not use drugs. ? ?Patient is unmarried and has no children.  She lives at home with her brother.  She has multiple siblings who are involved in her care. ? ?ADVANCE DIRECTIVES:  ?Does not have ? ?CODE STATUS: DNR/DNI (DNR form completed on 03/11/2022) ? ?PAST MEDICAL HISTORY: ?Past Medical History:  ?Diagnosis Date  ? Allergy   ? Anemia   ? Cancer [facility])   ? Colon cancer ([place])   ? GI bleed   ? ? ?PAST SURGICAL HISTORY:  ?Past Surgical History:  ?Procedure Laterality Date  ? COLONOSCOPY WITH PROPOFOL N/A 01/16/2022  ? Procedure: COLONOSCOPY WITH PROPOFOL;  Surgeon: [name], MD;  Location: [facility] ENDOSCOPY;  Service: Gastroenterology;  Laterality: N/A;  ? ESOPHAGOGASTRODUODENOSCOPY  01/16/2022  ? Procedure: ESOPHAGOGASTRODUODENOSCOPY (EGD);  Surgeon: [name], MD;  Location: [facility] ENDOSCOPY;  Service: Gastroenterology;;  ? [place] N/A 04/24/2021  ? Procedure: FLEXIBLE SIGMOIDOSCOPY;  Surgeon: [name], MD;  Location: [facility] ENDOSCOPY;  Service: Gastroenterology;  Laterality: N/A;  ? IR CV LINE INJECTION  11/13/2021  ? IR IMAGING GUIDED PORT INSERTION  11/19/2021  ? IR REMOVAL TUN ACCESS W/ PORT W/O FL MOD SED  11/19/2021  ? PORTACATH PLACEMENT N/A 05/01/2021  ? Procedure: INSERTION PORT-A-CATH;  Surgeon: Jules Husbands, MD;  Location: [facility] ORS;  Service: General;  Laterality: N/A;  ? ? ?HEMATOLOGY/ONCOLOGY HISTORY:  ?Oncology History  ?Primary malignant neoplasm of colon with metastasis in female [facility])  ?04/25/2021 Initial Diagnosis  ? Metastatic colon cancer in female [facility]) ?  ?04/25/2021 Cancer Staging  ? Staging form: Colon and Rectum, AJCC 8th Edition ?- Clinical stage from 04/25/2021: Stage IVC (cT4b, cN2, cM1c) - Signed by Sindy Guadeloupe, MD on 04/25/2021 ?Stage prefix: Initial diagnosis ? ?  ?05/19/2021 -  Chemotherapy  ? Patient is on Treatment Plan : COLORECTAL FOLFOX + Bevacizumab q14d  ?   ? ? ?ALLERGIES:  is allergic to oxaliplatin. ? ?MEDICATIONS:  ?Current Outpatient Medications  ?Medication Sig Dispense Refill  ? acetaminophen (TYLENOL) 325 MG tablet Take 650 mg by mouth every 6 (six) hours as needed.    ? feeding supplement (ENSURE ENLIVE / ENSURE PLUS) LIQD Take 237 mLs by mouth 3 (three) times daily between meals. 14220 mL 0  ? lidocaine-prilocaine (EMLA) cream Apply 1 application. topically as needed. Apply to port and cover with saran wrap 1-2 hours prior to port access 30 g 4  ? Multiple Vitamin (MULTIVITAMIN WITH MINERALS) TABS tablet Take  1 tablet by mouth daily. (Patient not taking: Reported on 03/03/2022) 30 tablet 0  ? phosphorus (K PHOS NEUTRAL) 155-852-130 MG tablet Take 2 tablets (500 mg total) by mouth 3 (three) times daily. (Patient not taking: Reported on 03/03/2022)    ? potassium chloride SA (KLOR-CON M) 20 MEQ tablet Take 20 mEq by mouth daily.    ? predniSONE (DELTASONE) 10 MG tablet Take 1 tablet (10 mg  total) by mouth daily with breakfast. 14 tablet 0  ? ?No current facility-administered medications for this visit.  ? ? ?VITAL SIGNS: ?LMP  (LMP Unknown)  ?There were no vitals filed for this visit.  ?Estimated body mass index is 16.87 kg/m? as calculated from the following: ?  Height as of 03/03/22: _0  (1.676 m). ?  Weight as of 03/03/22: 104 lb 8 oz (47.4 kg). ? ?LABS: ?CBC: ?   ?Component Value Date/Time  ? WBC 7.6 02/28/2022 0557  ? HGB 8.3 (L) 02/28/2022 0557  ? HCT 25.3 (L) 02/28/2022 0557  ? PLT 75 (L) 02/28/2022 0557  ? MCV 84.3 02/28/2022 0557  ? NEUTROABS 0.1 (LL) 02/25/2022 1515  ? LYMPHSABS 0.1 (L) 02/25/2022 1515  ? MONOABS 0.1 02/25/2022 1515  ? EOSABS 0.0 02/25/2022 1515  ? BASOSABS 0.0 02/25/2022 1515  ? ?Comprehensive Metabolic Panel: ?   ?Component Value Date/Time  ? NA 137 03/01/2022 0417  ? K 3.7 03/01/2022 0417  ? CL 106 03/01/2022 0417  ? CO2 24 03/01/2022 0417  ? BUN 12 03/01/2022 0417  ? CREATININE 0.53 03/01/2022 0417  ? GLUCOSE 104 (H) 03/01/2022 0417  ? CALCIUM 8.6 (L) 03/01/2022 0417  ? AST 11 (L) 02/27/2022 7782  ? ALT 9 02/27/2022 0823  ? ALKPHOS 68 02/27/2022 0823  ? BILITOT 0.5 02/27/2022 0823  ? PROT 4.9 (L) 02/27/2022 4235  ? ALBUMIN 1.7 (L) 02/27/2022 3614  ? ? ?RADIOGRAPHIC STUDIES: ?DG Chest Port 1 View ? ?Result Date: 02/25/2022 ?CLINICAL DATA:  Increased weakness EXAM: PORTABLE CHEST 1 VIEW COMPARISON:  Radiograph May 01, 2021 FINDINGS: Left chest wall Port-A-Cath with tip overlying the superior cavoatrial confluence. The heart size and mediastinal contours are within normal limits. No focal airspace consolidation. No visible pleural effusion or pneumothorax. The visualized skeletal structures are unremarkable. IMPRESSION: No acute cardiopulmonary findings. Electronically Signed   By: Dahlia Bailiff M.D.   On: 02/25/2022 13:14   ? ?PERFORMANCE STATUS (ECOG) : 3 - Symptomatic, >50% confined to bed ? ?Review of Systems ?Unless otherwise noted, a complete review of systems is  negative. ? ?Physical Exam ?General: NAD ?Cardiovascular: regular rate and rhythm ?Pulmonary: clear ant fields ?Abdomen: soft, nontender, + bowel sounds ?GU: no suprapubic tenderness ?Extremities: Bilateral pedal edema, no joint deformities ?Skin: no rashes ?Neurological: Weakness but otherwise nonfocal ? ?IMPRESSION: ?Met with patient and her sister today.  I introduced palliative care and attempted to establish therapeutic rapport. ? ?Patient has had declining performance status and recently discussed with Dr. Janese Banks the possibility that she would not be a candidate for further chemotherapy.  Hospice was discussed as an option.  Today, and discussing her goals, patient states that she is hopeful that she will be able to pursue further cancer treatment.  I also discussed with her the option of hospice but she was undecided. ? ?At baseline, patient is able to stand and ambulate short distances with a walker but is reliant on her family for assistance with ADLs.  Patient is interested in additional home resources.  Will involve community palliative  care and social work. ? ?Appetite is poor but patient is drinking oral nutritional supplements 2-3 times daily. ? ?Patient denies pain or other distressing symptoms today. ? ?Patient has several siblings who are involved.  She has a sister from [place] who is currently staying with her.  Patient does not have advance directives but would want one of her sisters to be her healthcare power of attorney if needed.  I sent patient home with ACP documents today. ? ?We discussed CODE STATUS.  Patient confirms that she would not want to be resuscitated or have her life prolonged artificial machines.  I sent her home with a DNR order. ? ?PLAN: ?-Continue current scope of treatment ?-ACP documents reviewed ?-DNR/DNI ?-Referrals to community palliative care and social work ?-Follow-up telephone visit 2 to 3 weeks ? ?Case and plan discussed with Dr. Janese Banks ? ?Patient expressed  understanding and was in agreement with this plan. She also understands that She can call the clinic at any time with any questions, concerns, or complaints.  ? ? ? ?Time Total: 20 minutes ? ?Visit consisted of counseli

## 2022-03-11 NOTE — Progress Notes (Deleted)
? ?  ?Palliative Medicine ?[place] at [facility] ?Telephone:(336) [phone] Fax:(336) [phone] ? ? ?Name: Alicia Coleman ?Date: 03/11/2022 ?MRN: [id number]  ?DOB: August 27, 1956 ? ?Patient Care Team: ?Pcp, No as PCP - General ?Clent Jacks, RN as Oncology Nurse Navigator ?Sindy Guadeloupe, MD as Consulting Physician (Oncology) ?[name], MD as Consulting Physician (Gastroenterology) ?Jules Husbands, MD as Consulting Physician (General Surgery)  ? ? ?REASON FOR CONSULTATION: ?Alicia Coleman is a 66 y.o. female with multiple medical problems including ***.  ? ? ?SOCIAL HISTORY:    ? reports that she has never smoked. She has never used smokeless tobacco. She reports that she does not currently use alcohol. She reports that she does not use drugs. ? ?ADVANCE DIRECTIVES:  ?*** ? ?CODE STATUS: {Palliative Code status:23503} ? ?PAST MEDICAL HISTORY: ?Past Medical History:  ?Diagnosis Date  ? Allergy   ? Anemia   ? Cancer [facility])   ? Colon cancer ([place])   ? GI bleed   ? ? ?PAST SURGICAL HISTORY:  ?Past Surgical History:  ?Procedure Laterality Date  ? COLONOSCOPY WITH PROPOFOL N/A 01/16/2022  ? Procedure: COLONOSCOPY WITH PROPOFOL;  Surgeon: [name], MD;  Location: [facility] ENDOSCOPY;  Service: Gastroenterology;  Laterality: N/A;  ? ESOPHAGOGASTRODUODENOSCOPY  01/16/2022  ? Procedure: ESOPHAGOGASTRODUODENOSCOPY (EGD);  Surgeon: [name], MD;  Location: [facility] ENDOSCOPY;  Service: Gastroenterology;;  ? [place] N/A 04/24/2021  ? Procedure: FLEXIBLE SIGMOIDOSCOPY;  Surgeon: [name], MD;  Location: [facility] ENDOSCOPY;  Service: Gastroenterology;  Laterality: N/A;  ? IR CV LINE INJECTION  11/13/2021  ? IR IMAGING GUIDED PORT INSERTION  11/19/2021  ? IR REMOVAL TUN ACCESS W/ PORT W/O FL MOD SED  11/19/2021  ? PORTACATH PLACEMENT N/A 05/01/2021  ? Procedure: INSERTION PORT-A-CATH;  Surgeon: Jules Husbands, MD;  Location: [facility] ORS;  Service: General;  Laterality: N/A;  ? ? ?HEMATOLOGY/ONCOLOGY HISTORY:   ?Oncology History  ?Primary malignant neoplasm of colon with metastasis in female [facility])  ?04/25/2021 Initial Diagnosis  ? Metastatic colon cancer in female [facility]) ?  ?04/25/2021 Cancer Staging  ? Staging form: Colon and Rectum, AJCC 8th Edition ?- Clinical stage from 04/25/2021: Stage IVC (cT4b, cN2, cM1c) - Signed by Sindy Guadeloupe, MD on 04/25/2021 ?Stage prefix: Initial diagnosis ? ?  ?05/19/2021 -  Chemotherapy  ? Patient is on Treatment Plan : COLORECTAL FOLFOX + Bevacizumab q14d  ?   ? ? ?ALLERGIES:  is allergic to oxaliplatin. ? ?MEDICATIONS:  ?Current Outpatient Medications  ?Medication Sig Dispense Refill  ? acetaminophen (TYLENOL) 325 MG tablet Take 650 mg by mouth every 6 (six) hours as needed.    ? feeding supplement (ENSURE ENLIVE / ENSURE PLUS) LIQD Take 237 mLs by mouth 3 (three) times daily between meals. 14220 mL 0  ? lidocaine-prilocaine (EMLA) cream Apply 1 application. topically as needed. Apply to port and cover with saran wrap 1-2 hours prior to port access 30 g 4  ? Multiple Vitamin (MULTIVITAMIN WITH MINERALS) TABS tablet Take 1 tablet by mouth daily. (Patient not taking: Reported on 03/03/2022) 30 tablet 0  ? phosphorus (K PHOS NEUTRAL) 155-852-130 MG tablet Take 2 tablets (500 mg total) by mouth 3 (three) times daily. (Patient not taking: Reported on 03/03/2022)    ? potassium chloride SA (KLOR-CON M) 20 MEQ tablet Take 20 mEq by mouth daily.    ? predniSONE (DELTASONE) 10 MG tablet Take 1 tablet (10 mg total) by mouth daily with breakfast. 14 tablet 0  ? ?No  current facility-administered medications for this visit.  ? ? ?VITAL SIGNS: ?LMP  (LMP Unknown)  ?There were no vitals filed for this visit.  ?Estimated body mass index is 16.87 kg/m? as calculated from the following: ?  Height as of 03/03/22: '5\' 6"'$  (1.676 m). ?  Weight as of 03/03/22: 104 lb 8 oz (47.4 kg). ? ?LABS: ?CBC: ?   ?Component Value Date/Time  ? WBC 7.6 02/28/2022 0557  ? HGB 8.3 (L) 02/28/2022 0557  ? HCT 25.3 (L) 02/28/2022 0557  ?  PLT 75 (L) 02/28/2022 0557  ? MCV 84.3 02/28/2022 0557  ? NEUTROABS 0.1 (LL) 02/25/2022 1515  ? LYMPHSABS 0.1 (L) 02/25/2022 1515  ? MONOABS 0.1 02/25/2022 1515  ? EOSABS 0.0 02/25/2022 1515  ? BASOSABS 0.0 02/25/2022 1515  ? ?Comprehensive Metabolic Panel: ?   ?Component Value Date/Time  ? NA 137 03/01/2022 0417  ? K 3.7 03/01/2022 0417  ? CL 106 03/01/2022 0417  ? CO2 24 03/01/2022 0417  ? BUN 12 03/01/2022 0417  ? CREATININE 0.53 03/01/2022 0417  ? GLUCOSE 104 (H) 03/01/2022 0417  ? CALCIUM 8.6 (L) 03/01/2022 0417  ? AST 11 (L) 02/27/2022 2423  ? ALT 9 02/27/2022 0823  ? ALKPHOS 68 02/27/2022 0823  ? BILITOT 0.5 02/27/2022 0823  ? PROT 4.9 (L) 02/27/2022 5361  ? ALBUMIN 1.7 (L) 02/27/2022 4431  ? ? ?RADIOGRAPHIC STUDIES: ?DG Chest Port 1 View ? ?Result Date: 02/25/2022 ?CLINICAL DATA:  Increased weakness EXAM: PORTABLE CHEST 1 VIEW COMPARISON:  Radiograph May 01, 2021 FINDINGS: Left chest wall Port-A-Cath with tip overlying the superior cavoatrial confluence. The heart size and mediastinal contours are within normal limits. No focal airspace consolidation. No visible pleural effusion or pneumothorax. The visualized skeletal structures are unremarkable. IMPRESSION: No acute cardiopulmonary findings. Electronically Signed   By: Dahlia Bailiff M.D.   On: 02/25/2022 13:14   ? ?PERFORMANCE STATUS (ECOG) : {CHL ONC ECOG VQ:[id number]} ? ?Review of Systems ?Unless otherwise noted, a complete review of systems is negative. ? ?Physical Exam ?General: NAD ?Cardiovascular: regular rate and rhythm ?Pulmonary: clear ant fields ?Abdomen: soft, nontender, + bowel sounds ?GU: no suprapubic tenderness ?Extremities: no edema, no joint deformities ?Skin: no rashes ?Neurological: Weakness but otherwise nonfocal ? ?IMPRESSION: ?*** ? ?PLAN: ?*** ? ? ?Patient expressed understanding and was in agreement with this plan. She also understands that She can call the clinic at any time with any questions, concerns, or complaints.   ? ? ? ?Time Total: *** ? ?Visit consisted of counseling and education dealing with the complex and emotionally intense issues of symptom management and palliative care in the setting of serious and potentially life-threatening illness.Greater than 50%  of this time was spent counseling and coordinating care related to the above assessment and plan. ? ?Signed by: ?Altha Harm, PhD, NP-C ? ? ?

## 2022-03-12 ENCOUNTER — Ambulatory Visit: Payer: BC Managed Care – PPO

## 2022-03-13 ENCOUNTER — Ambulatory Visit: Payer: BC Managed Care – PPO

## 2022-03-16 ENCOUNTER — Ambulatory Visit: Payer: BC Managed Care – PPO

## 2022-03-17 ENCOUNTER — Telehealth: Payer: Self-pay

## 2022-03-17 ENCOUNTER — Inpatient Hospital Stay (HOSPITAL_BASED_OUTPATIENT_CLINIC_OR_DEPARTMENT_OTHER): Payer: BC Managed Care – PPO | Admitting: Oncology

## 2022-03-17 ENCOUNTER — Encounter: Payer: Self-pay | Admitting: Oncology

## 2022-03-17 ENCOUNTER — Inpatient Hospital Stay: Payer: BC Managed Care – PPO

## 2022-03-17 ENCOUNTER — Other Ambulatory Visit: Payer: Self-pay | Admitting: *Deleted

## 2022-03-17 VITALS — BP 111/76 | HR 93 | Temp 96.5°F | Resp 16 | Wt 102.9 lb

## 2022-03-17 DIAGNOSIS — C187 Malignant neoplasm of sigmoid colon: Secondary | ICD-10-CM | POA: Diagnosis not present

## 2022-03-17 DIAGNOSIS — C189 Malignant neoplasm of colon, unspecified: Secondary | ICD-10-CM

## 2022-03-17 DIAGNOSIS — Z7189 Other specified counseling: Secondary | ICD-10-CM

## 2022-03-17 LAB — COMPREHENSIVE METABOLIC PANEL
ALT: 17 U/L (ref 0–44)
AST: 25 U/L (ref 15–41)
Albumin: 2.1 g/dL — ABNORMAL LOW (ref 3.5–5.0)
Alkaline Phosphatase: 222 U/L — ABNORMAL HIGH (ref 38–126)
Anion gap: 6 (ref 5–15)
BUN: 8 mg/dL (ref 8–23)
CO2: 26 mmol/L (ref 22–32)
Calcium: 8.5 mg/dL — ABNORMAL LOW (ref 8.9–10.3)
Chloride: 102 mmol/L (ref 98–111)
Creatinine, Ser: 0.39 mg/dL — ABNORMAL LOW (ref 0.44–1.00)
GFR, Estimated: >60 mL/min (ref >60)
Glucose, Bld: 93 mg/dL (ref 70–99)
Potassium: 3.6 mmol/L (ref 3.5–5.1)
Sodium: 134 mmol/L — ABNORMAL LOW (ref 135–145)
Total Bilirubin: 0.4 mg/dL (ref 0.3–1.2)
Total Protein: 6.2 g/dL — ABNORMAL LOW (ref 6.5–8.1)

## 2022-03-17 LAB — CBC WITH DIFFERENTIAL/PLATELET
Abs Immature Granulocytes: 0.21 10*3/uL — ABNORMAL HIGH (ref 0.00–0.07)
Basophils Absolute: 0 10*3/uL (ref 0.0–0.1)
Basophils Relative: 0 %
Eosinophils Absolute: 0.1 10*3/uL (ref 0.0–0.5)
Eosinophils Relative: 0 %
HCT: 28.8 % — ABNORMAL LOW (ref 36.0–46.0)
Hemoglobin: 9 g/dL — ABNORMAL LOW (ref 12.0–15.0)
Immature Granulocytes: 1 %
Lymphocytes Relative: 5 %
Lymphs Abs: 0.8 10*3/uL (ref 0.7–4.0)
MCH: 28 pg (ref 26.0–34.0)
MCHC: 31.3 g/dL (ref 30.0–36.0)
MCV: 89.4 fL (ref 80.0–100.0)
Monocytes Absolute: 1.1 10*3/uL — ABNORMAL HIGH (ref 0.1–1.0)
Monocytes Relative: 6 %
Neutro Abs: 16.1 10*3/uL — ABNORMAL HIGH (ref 1.7–7.7)
Neutrophils Relative %: 88 %
Platelets: 372 10*3/uL (ref 150–400)
RBC: 3.22 MIL/uL — ABNORMAL LOW (ref 3.87–5.11)
RDW: 16.9 % — ABNORMAL HIGH (ref 11.5–15.5)
WBC: 18.4 10*3/uL — ABNORMAL HIGH (ref 4.0–10.5)
nRBC: 0 % (ref 0.0–0.2)

## 2022-03-17 LAB — PHOSPHORUS: Phosphorus: 2.3 mg/dL — ABNORMAL LOW (ref 2.5–4.6)

## 2022-03-17 MED ORDER — K PHOS MONO-SOD PHOS DI & MONO 155-852-130 MG PO TABS: 500.0000 mg | ORAL_TABLET | Freq: Three times a day (TID) | ORAL | 1 refills | Status: DC

## 2022-03-17 MED ORDER — SODIUM CHLORIDE 0.9 % IV SOLN
10.0000 mg | Freq: Once | INTRAVENOUS | Status: AC
  Filled 2022-03-17 (×2): qty 1

## 2022-03-17 MED ORDER — FLUOROURACIL CHEMO INJECTION 2.5 GM/50ML: 400.0000 mg/m2 | Freq: Once | INTRAVENOUS | Status: DC

## 2022-03-17 MED ORDER — SODIUM CHLORIDE 0.9 % IV SOLN
Freq: Once | INTRAVENOUS | Status: AC
  Filled 2022-03-17: qty 250

## 2022-03-17 MED ORDER — HEPARIN SOD (PORK) LOCK FLUSH 100 UNIT/ML IV SOLN
500.0000 [IU] | Freq: Once | INTRAVENOUS | Status: AC
  Administered 2022-03-17: 500 [IU] via INTRAVENOUS
  Filled 2022-03-17: qty 5

## 2022-03-17 MED ORDER — SODIUM CHLORIDE 0.9 % IV SOLN: 150.0000 mg/m2 | Freq: Once | INTRAVENOUS | Status: DC

## 2022-03-17 MED ORDER — SODIUM CHLORIDE 0.9 % IV SOLN: 2400.0000 mg/m2 | INTRAVENOUS | Status: DC

## 2022-03-17 MED ORDER — ATROPINE SULFATE 1 MG/ML IV SOLN: 0.5000 mg | Freq: Once | INTRAVENOUS | Status: DC

## 2022-03-17 MED ORDER — SODIUM CHLORIDE 0.9 % IV SOLN: 400.0000 mg/m2 | Freq: Once | INTRAVENOUS | Status: DC

## 2022-03-17 NOTE — Progress Notes (Signed)
Pt states that ever since she started prednisone her feet have been swelling up, not painful just feels heavy.  ? ?

## 2022-03-17 NOTE — Progress Notes (Signed)
? ? ? ?Hematology/Oncology Consult note ?[place]  ?Telephone:([phone]) [phone] [phone] ? ?Patient Care Team: ?Pcp, No as PCP - General ?Clent Jacks, RN as Oncology Nurse Navigator ?Sindy Guadeloupe, MD as Consulting Physician (Oncology) ?[name], MD as Consulting Physician (Gastroenterology) ?Jules Husbands, MD as Consulting Physician (General Surgery)  ? ?Name of the patient: Alicia Coleman  ?[id number]  ?13-Apr-1956  ? ?Date of visit: 03/17/22 ? ?Diagnosis- metastatic colon cancer with liver metastases ? ?Chief complaint/ Reason for visit-discuss further management of colon cancer ? ?Heme/Onc history: Patient is a 66 year old female who was referred from the ER for severe iron deficiency anemia.  She received IV Venofer for that.  I was concerned about malignancy given the degree of anemia and therefore obtain CT abdomen and pelvis with contrast CT showed 6.4 cm enhancing mass in the right liver and another mass measuring 4 cm.  Large volume stool in the right colon transverse and descending colon with irregular mass lesion in the distal sigmoid colon extending into the rectosigmoid junction with lateral extracolonic extension into the pelvic sidewall.  Apparent luminal narrowing at the level of the lesion which accounts for large volume of stool proximally.  Multiple pericolonic and perirectal lymph nodes.  Multiple peritoneal nodules are seen in the lower right pelvic sidewall.  Colon mass appears to be invading posterior uterus.Thrombosis of the superior rectal vein may be bland thrombus although direct tumor invasion not excluded. ?  ?Patient had a flexible sigmoidoscopy which showed a large obstructing mass in the rectosigmoid colon.  Scope could not be traversed beyond the obstruction.  Biopsies were taken and were consistent with adenocarcinoma. Patient had a diverting colostomy on 05/01/2021 with Dr. Dahlia Byes ?  ?Patient started palliative FOLFOX chemotherapy in June 2022  with plans to add Avastin after 6 cycles of treatment.  Patient developed allergic reaction to oxaliplatin with cycle 10 as evidenced by diaphoresis vomiting and flushing.  Plan is to hold off on further doses of oxaliplatin.  She has K-ras mutation positive ?  ?Progression noted on scans in January 2023.  Plan to switch to FOLFIRI bevacizumab.  Bevacizumab was stopped after there was concern for bleeding from the primary colorectal mass.  Chemotherapy on hold since 02/17/2022 due to poor performance status ?  ? ?Interval history- Patient continues to have a poor appetite.She has lost 2 more pounds and is down to 202 pounds presently.  She is trying to drink her protein shakes.  Also reports bilateral ankle swelling after starting steroids. ? ?ECOG PS- 3 ?Pain scale- 0 ? ? ?Review of systems- Review of Systems  ?Constitutional:  Positive for malaise/fatigue and weight loss. Negative for chills and fever.  ?HENT:  Negative for congestion, ear discharge and nosebleeds.   ?Eyes:  Negative for blurred vision.  ?Respiratory:  Negative for cough, hemoptysis, sputum production, shortness of breath and wheezing.   ?Cardiovascular:  Negative for chest pain, palpitations, orthopnea and claudication.  ?Gastrointestinal:  Negative for abdominal pain, blood in stool, constipation, diarrhea, heartburn, melena, nausea and vomiting.  ?Genitourinary:  Negative for dysuria, flank pain, frequency, hematuria and urgency.  ?Musculoskeletal:  Negative for back pain, joint pain and myalgias.  ?Skin:  Negative for rash.  ?Neurological:  Negative for dizziness, tingling, focal weakness, seizures, weakness and headaches.  ?Endo/Heme/Allergies:  Does not bruise/bleed easily.  ?Psychiatric/Behavioral:  Negative for depression and suicidal ideas. The patient does not have insomnia.    ? ? ?Allergies  ?Allergen Reactions  ?  Oxaliplatin Nausea And Vomiting  ? ? ? ?Past Medical History:  ?Diagnosis Date  ? Allergy   ? Anemia   ? Cancer [facility])   ?  Colon cancer ([place])   ? GI bleed   ? ? ? ?Past Surgical History:  ?Procedure Laterality Date  ? COLONOSCOPY WITH PROPOFOL N/A 01/16/2022  ? Procedure: COLONOSCOPY WITH PROPOFOL;  Surgeon: [name], MD;  Location: [facility] ENDOSCOPY;  Service: Gastroenterology;  Laterality: N/A;  ? ESOPHAGOGASTRODUODENOSCOPY  01/16/2022  ? Procedure: ESOPHAGOGASTRODUODENOSCOPY (EGD);  Surgeon: [name], MD;  Location: [facility] ENDOSCOPY;  Service: Gastroenterology;;  ? [place] N/A 04/24/2021  ? Procedure: FLEXIBLE SIGMOIDOSCOPY;  Surgeon: [name], MD;  Location: [facility] ENDOSCOPY;  Service: Gastroenterology;  Laterality: N/A;  ? IR CV LINE INJECTION  11/13/2021  ? IR IMAGING GUIDED PORT INSERTION  11/19/2021  ? IR REMOVAL TUN ACCESS W/ PORT W/O FL MOD SED  11/19/2021  ? PORTACATH PLACEMENT N/A 05/01/2021  ? Procedure: INSERTION PORT-A-CATH;  Surgeon: Jules Husbands, MD;  Location: [facility] ORS;  Service: General;  Laterality: N/A;  ? ? ?Social History  ? ?Socioeconomic History  ? Marital status: Married  ?  Spouse name: Not on file  ? Number of children: Not on file  ? Years of education: Not on file  ? Highest education level: Not on file  ?Occupational History  ? Not on file  ?Tobacco Use  ? Smoking status: Never  ? Smokeless tobacco: Never  ?Vaping Use  ? Vaping Use: Never used  ?Substance and Sexual Activity  ? Alcohol use: Not Currently  ? Drug use: Never  ? Sexual activity: Not Currently  ?Other Topics Concern  ? Not on file  ?Social History Narrative  ? Not on file  ? ?Social Determinants of Health  ? ?Financial Resource Strain: Not on file  ?Food Insecurity: Not on file  ?Transportation Needs: Not on file  ?Physical Activity: Not on file  ?Stress: Not on file  ?Social Connections: Not on file  ?Intimate Partner Violence: Not on file  ? ? ?Family History  ?Problem Relation Age of Onset  ? Stroke Mother   ? Hypertension Mother   ? Cancer Father   ? ? ? ?Current Outpatient Medications:  ?  acetaminophen (TYLENOL) 325 MG  tablet, Take 650 mg by mouth every 6 (six) hours as needed., Disp: , Rfl:  ?  feeding supplement (ENSURE ENLIVE / ENSURE PLUS) LIQD, Take 237 mLs by mouth 3 (three) times daily between meals., Disp: 14220 mL, Rfl: 0 ?  lidocaine-prilocaine (EMLA) cream, Apply 1 application. topically as needed. Apply to port and cover with saran wrap 1-2 hours prior to port access, Disp: 30 g, Rfl: 4 ?  Multiple Vitamin (MULTIVITAMIN WITH MINERALS) TABS tablet, Take 1 tablet by mouth daily., Disp: 30 tablet, Rfl: 0 ?  phosphorus (K PHOS NEUTRAL) 155-852-130 MG tablet, Take 2 tablets (500 mg total) by mouth 3 (three) times daily., Disp: , Rfl:  ?  potassium chloride SA (KLOR-CON M) 20 MEQ tablet, Take 20 mEq by mouth daily., Disp: , Rfl:  ?  predniSONE (DELTASONE) 10 MG tablet, Take 1 tablet (10 mg total) by mouth daily with breakfast., Disp: 14 tablet, Rfl: 0 ?No current facility-administered medications for this visit. ? ?Facility-Administered Medications Ordered in Other Visits:  ?  0.9 %  sodium chloride infusion, , Intravenous, Once, Sindy Guadeloupe, MD ?  dexamethasone (DECADRON) 10 mg in sodium chloride 0.9 % 50 mL IVPB, 10 mg, Intravenous, Once, Janese Banks,  Weston Anna, MD ? ?Physical exam:  ?Vitals:  ? 03/17/22 1040  ?BP: 111/76  ?Pulse: 93  ?Resp: 16  ?Temp: (!) 96.5 ?F (35.8 ?C)  ?SpO2: 100%  ?Weight: 102 lb 14.4 oz (46.7 kg)  ? ?Physical Exam ?Constitutional:   ?   Comments: Patient is thin and cachectic.  Sitting in a wheelchair  ?Cardiovascular:  ?   Rate and Rhythm: Normal rate and regular rhythm.  ?   Heart sounds: Normal heart sounds.  ?Pulmonary:  ?   Effort: Pulmonary effort is normal.  ?   Breath sounds: Normal breath sounds.  ?Abdominal:  ?   General: Bowel sounds are normal.  ?   Palpations: Abdomen is soft.  ?Skin: ?   General: Skin is warm and dry.  ?Neurological:  ?   Mental Status: She is alert and oriented to person, place, and time.  ?  ? ? ?  Latest Ref Rng & Units 03/17/2022  ? 10:12 AM  ?CMP  ?Glucose 70 - 99  mg/dL 93    ?BUN 8 - 23 mg/dL 8    ?Creatinine 0.44 - 1.00 mg/dL 0.39    ?Sodium 135 - 145 mmol/L 134    ?Potassium 3.5 - 5.1 mmol/L 3.6    ?Chloride 98 - 111 mmol/L 102    ?CO2 22 - 32 mmol/L 26    ?Calcium 8.9 -

## 2022-03-17 NOTE — Telephone Encounter (Signed)
Spoke with patient's sister Mateo Flow and scheduled a telephonic Palliative Consult for 03/19/22 @ 1 PM.  ? ?Consent obtained; updated Netsmart, Team List and Epic.  ? ?

## 2022-03-18 NOTE — Progress Notes (Signed)
Called yest. And got no answer and both phone numbers were full and can't leave a message. I called today and called her sister and she states that she will give me Alicia Coleman another sister that is helping her at home. # [phone]. I called Alicia Coleman and she states she does have some but she will get new rx and continue to make her take it . ?

## 2022-03-19 ENCOUNTER — Telehealth: Payer: Self-pay | Admitting: Nurse Practitioner

## 2022-03-19 ENCOUNTER — Inpatient Hospital Stay: Payer: BC Managed Care – PPO | Attending: Oncology

## 2022-03-19 ENCOUNTER — Other Ambulatory Visit: Payer: Self-pay | Admitting: Nurse Practitioner

## 2022-03-19 ENCOUNTER — Inpatient Hospital Stay: Payer: BC Managed Care – PPO

## 2022-03-19 NOTE — Progress Notes (Signed)
Nutrition Assessment ? ? ?Reason for Assessment:  ?Inpatient team, weight loss ? ?ASSESSMENT:  ?66 year old female with metastatic colon cancer with liver metastases.  S/p diverting colostomy on 05/01/21.  Patient has been on chemotherapy but on hold since 3/21 due to poor performance status.  Planning CT.   ? ?Met with patient and sister in clinic.  Patient reports that her appetite is coming back a little bit.  Says she drank 2 ensures yesterday, ate spoonful of peanut butter, 1 [place] wing, green beans  and 1/2 sweet potato.  Does not really like the ensure shakes, too sweet.  Reports bowel movement about every 2 days last one was yesterday.  ? ? ?Nutrition Focused Physical Exam:  ? ?Orbital Region: mild ?Buccal Region: mild ?Upper Arm Region: severe ?Thoracic and Lumbar Region: severe ?Temple Region: moderate ?Clavicle Bone Region: severe ?Shoulder and Acromion Bone Region: severe ?Scapular Bone Region: severe ?Dorsal Hand: mild ?Patellar Region: moderate ?Anterior Thigh Region: severe ?Posterior Calf Region: mild ?Edema (RD assessment): 1+ ?Hair: reviewed ?Eyes: reviewed ?Mouth: reviewed ?Skin: reviewed ?Nails: reviewed ? ? ?Medications: MVI, K Phos neutral (sister picked up today), KCL ? ? ?Labs: Na 134, phosphorus 2.3,  ? ? ?Anthropometrics:  ? ?Height: 66 inches ?Weight: 102 lb 14.4 oz on 4/18 ?124 lb on 01/14/22 ?129 lb on 12/03/21 ?129  lb 11/19/21 ?BMI: 16 ? ?18% weight loss in the last 2 months, significant ? ? ?Estimated Energy Needs ? ?Kcals: 1400-1600 ?Protein: 70-80 g ?Fluid: 1400-1800 ? ? ?NUTRITION DIAGNOSIS: Inadequate oral intake related to cancer, recent hospital admission, treatment side effects as evidenced by 18% weight loss in the last 2 months and severe fat loss and muscle mass loss and eating less than 75% estimated energy needs for > 1 month ? ? ?MALNUTRITION DIAGNOSIS: Severe malnutrition in context of 18% weight loss in the last 2 months, severe fat loss and severe muscle mass loss and  eating < 75% estimated energy needs for > 1 month ? ? ?INTERVENTION:  ?Continue phosphorus supplementation and recheck next lab draw.   ?Discussed "nibbling" q 2 hours ?Encouraged protein foods and examples provided ?Discussed adding milk or lactaid milk to shakes to cut sweetness ?Samples of D[place] 1.4 shake given to patient to try. Fairlife shakes could be an option as well (lactose free) ?Contact information provided ? ? ?MONITORING, EVALUATION, GOAL: weight trends, intake ? ? ?Next Visit: Thursday, May 4 phone call ? ?Jatavious Peppard B. Zenia Resides, RD, LDN ?Registered Dietitian ?[phone] ? ? ? ? ? ?

## 2022-03-19 NOTE — Telephone Encounter (Signed)
I called Alicia Coleman for scheduled PC consult on home and mobile phone, no answer, message left with contact information to return call. ?

## 2022-03-23 ENCOUNTER — Telehealth: Payer: Self-pay | Admitting: Nurse Practitioner

## 2022-03-23 NOTE — Telephone Encounter (Signed)
Attempted to contact patient to reschedule the Palliative Consult, no answer - left message requesting a return call to reschedule visit.  Patient was a No Show for the 03/19/22 visit. ?

## 2022-03-26 ENCOUNTER — Ambulatory Visit
Admission: RE | Admit: 2022-03-26 | Discharge: 2022-03-26 | Disposition: A | Discharge status: Home / Self Care | Payer: BC Managed Care – PPO | Source: Ambulatory Visit | Attending: Oncology | Admitting: Oncology

## 2022-03-26 DIAGNOSIS — C189 Malignant neoplasm of colon, unspecified: Secondary | ICD-10-CM | POA: Insufficient documentation

## 2022-03-26 MED ORDER — HEPARIN SOD (PORK) LOCK FLUSH 100 UNIT/ML IV SOLN
500.0000 [IU] | Freq: Once | INTRAVENOUS | Status: DC
Start: 2022-03-26 — End: 2022-03-27
  Filled 2022-03-26: qty 5

## 2022-03-26 MED ORDER — IOHEXOL 300 MG/ML  SOLN
80.0000 mL | Freq: Once | INTRAMUSCULAR | Status: AC | PRN
  Administered 2022-03-26: 80 mL via INTRAVENOUS

## 2022-03-26 MED ORDER — HEPARIN SOD (PORK) LOCK FLUSH 100 UNIT/ML IV SOLN
INTRAVENOUS | Status: AC
Start: 2022-03-26 — End: 2022-03-26
  Administered 2022-03-26: 500 [IU]
  Filled 2022-03-26: qty 5

## 2022-03-27 ENCOUNTER — Telehealth: Payer: Self-pay | Admitting: *Deleted

## 2022-03-27 NOTE — Telephone Encounter (Signed)
Called report ? ?IMPRESSION: ?1. New mild left hydroureteronephrosis to the level of the pelvic ?mass with subtle hypoenhancement of the left kidney, consistent with ?obstruction from the colonic mass, consider urology referral. ?2. Large colonic mass with extra colonic extension and central ?necrotic debris again communicates with the colonic lumen. The ?dominant area of the mass measures larger at approximately 7.1 x 4.1 ?cm. However, a significant component of this reflects internal ?necrotic debris with the soft tissue component of the mass while ?difficult to measure, subjectively appears similar to prior. ?3. Increased size of the segment VI hepatic metastatic lesion, ?consistent with worsening metastatic disease. ?4. Similar pelvic and mesenteric adenopathy/nodularity. ?5. Increased small volume abdominopelvic ascites and diffuse ?subcutaneous edema. ?  ?These results will be called to the ordering clinician or ?representative by the Radiologist Assistant, and communication ?documented in the PACS or [employer]. ?  ?  ?Electronically Signed ?  By: Dahlia Bailiff M.D. ?  On: 03/27/2022 08:52 ?  ?

## 2022-04-01 ENCOUNTER — Other Ambulatory Visit (HOSPITAL_COMMUNITY): Payer: Self-pay

## 2022-04-01 ENCOUNTER — Inpatient Hospital Stay: Payer: BC Managed Care – PPO | Attending: Hospice and Palliative Medicine | Admitting: Hospice and Palliative Medicine

## 2022-04-01 ENCOUNTER — Other Ambulatory Visit: Payer: Self-pay | Admitting: *Deleted

## 2022-04-01 ENCOUNTER — Inpatient Hospital Stay: Payer: BC Managed Care – PPO

## 2022-04-01 ENCOUNTER — Telehealth: Payer: Self-pay | Admitting: Pharmacy Technician

## 2022-04-01 ENCOUNTER — Encounter: Payer: Self-pay | Admitting: Oncology

## 2022-04-01 ENCOUNTER — Inpatient Hospital Stay (HOSPITAL_BASED_OUTPATIENT_CLINIC_OR_DEPARTMENT_OTHER): Payer: BC Managed Care – PPO | Admitting: Oncology

## 2022-04-01 ENCOUNTER — Telehealth: Payer: Self-pay | Admitting: Pharmacist

## 2022-04-01 VITALS — BP 114/82 | HR 99 | Temp 97.5°F | Resp 16 | Wt 114.4 lb

## 2022-04-01 DIAGNOSIS — E876 Hypokalemia: Secondary | ICD-10-CM

## 2022-04-01 DIAGNOSIS — Z515 Encounter for palliative care: Secondary | ICD-10-CM

## 2022-04-01 DIAGNOSIS — D649 Anemia, unspecified: Secondary | ICD-10-CM | POA: Diagnosis not present

## 2022-04-01 DIAGNOSIS — D6481 Anemia due to antineoplastic chemotherapy: Secondary | ICD-10-CM | POA: Diagnosis not present

## 2022-04-01 DIAGNOSIS — C7989 Secondary malignant neoplasm of other specified sites: Secondary | ICD-10-CM | POA: Diagnosis not present

## 2022-04-01 DIAGNOSIS — C189 Malignant neoplasm of colon, unspecified: Secondary | ICD-10-CM

## 2022-04-01 DIAGNOSIS — Z66 Do not resuscitate: Secondary | ICD-10-CM | POA: Insufficient documentation

## 2022-04-01 DIAGNOSIS — Z7189 Other specified counseling: Secondary | ICD-10-CM

## 2022-04-01 DIAGNOSIS — D701 Agranulocytosis secondary to cancer chemotherapy: Secondary | ICD-10-CM | POA: Insufficient documentation

## 2022-04-01 DIAGNOSIS — T451X5A Adverse effect of antineoplastic and immunosuppressive drugs, initial encounter: Secondary | ICD-10-CM | POA: Insufficient documentation

## 2022-04-01 DIAGNOSIS — R609 Edema, unspecified: Secondary | ICD-10-CM | POA: Insufficient documentation

## 2022-04-01 DIAGNOSIS — D509 Iron deficiency anemia, unspecified: Secondary | ICD-10-CM

## 2022-04-01 DIAGNOSIS — E538 Deficiency of other specified B group vitamins: Secondary | ICD-10-CM

## 2022-04-01 DIAGNOSIS — C787 Secondary malignant neoplasm of liver and intrahepatic bile duct: Secondary | ICD-10-CM | POA: Diagnosis present

## 2022-04-01 DIAGNOSIS — Z79899 Other long term (current) drug therapy: Secondary | ICD-10-CM | POA: Diagnosis not present

## 2022-04-01 DIAGNOSIS — Z95828 Presence of other vascular implants and grafts: Secondary | ICD-10-CM

## 2022-04-01 LAB — COMPREHENSIVE METABOLIC PANEL
ALT: 14 U/L (ref 0–44)
AST: 29 U/L (ref 15–41)
Albumin: 1.6 g/dL — ABNORMAL LOW (ref 3.5–5.0)
Alkaline Phosphatase: 184 U/L — ABNORMAL HIGH (ref 38–126)
Anion gap: 7 (ref 5–15)
BUN: 9 mg/dL (ref 8–23)
CO2: 28 mmol/L (ref 22–32)
Calcium: 7.8 mg/dL — ABNORMAL LOW (ref 8.9–10.3)
Chloride: 100 mmol/L (ref 98–111)
Creatinine, Ser: 0.42 mg/dL — ABNORMAL LOW (ref 0.44–1.00)
GFR, Estimated: >60 mL/min (ref >60)
Glucose, Bld: 81 mg/dL (ref 70–99)
Potassium: 2.6 mmol/L — CL (ref 3.5–5.1)
Sodium: 135 mmol/L (ref 135–145)
Total Bilirubin: 0.5 mg/dL (ref 0.3–1.2)
Total Protein: 5.8 g/dL — ABNORMAL LOW (ref 6.5–8.1)

## 2022-04-01 LAB — CBC WITH DIFFERENTIAL/PLATELET
Abs Immature Granulocytes: 0.13 10*3/uL — ABNORMAL HIGH (ref 0.00–0.07)
Basophils Absolute: 0 10*3/uL (ref 0.0–0.1)
Basophils Relative: 0 %
Eosinophils Absolute: 0.1 10*3/uL (ref 0.0–0.5)
Eosinophils Relative: 1 %
HCT: 21.8 % — ABNORMAL LOW (ref 36.0–46.0)
Hemoglobin: 6.7 g/dL — ABNORMAL LOW (ref 12.0–15.0)
Immature Granulocytes: 1 %
Lymphocytes Relative: 3 %
Lymphs Abs: 0.6 10*3/uL — ABNORMAL LOW (ref 0.7–4.0)
MCH: 27.8 pg (ref 26.0–34.0)
MCHC: 30.7 g/dL (ref 30.0–36.0)
MCV: 90.5 fL (ref 80.0–100.0)
Monocytes Absolute: 0.8 10*3/uL (ref 0.1–1.0)
Monocytes Relative: 5 %
Neutro Abs: 15.8 10*3/uL — ABNORMAL HIGH (ref 1.7–7.7)
Neutrophils Relative %: 90 %
Platelets: 299 10*3/uL (ref 150–400)
RBC: 2.41 MIL/uL — ABNORMAL LOW (ref 3.87–5.11)
RDW: 15.9 % — ABNORMAL HIGH (ref 11.5–15.5)
WBC: 17.5 10*3/uL — ABNORMAL HIGH (ref 4.0–10.5)
nRBC: 0 % (ref 0.0–0.2)

## 2022-04-01 LAB — PREPARE RBC (CROSSMATCH)

## 2022-04-01 MED ORDER — TRIFLURIDINE-TIPIRACIL 15-6.14 MG PO TABS: 35.0000 mg/m2 | ORAL_TABLET | Freq: Two times a day (BID) | ORAL | 5 refills | Status: DC

## 2022-04-01 MED ORDER — SODIUM CHLORIDE 0.9% FLUSH
10.0000 mL | Freq: Once | INTRAVENOUS | Status: AC
  Administered 2022-04-01: 10 mL via INTRAVENOUS
  Filled 2022-04-01: qty 10

## 2022-04-01 MED ORDER — HEPARIN SOD (PORK) LOCK FLUSH 100 UNIT/ML IV SOLN
500.0000 [IU] | Freq: Once | INTRAVENOUS | Status: AC | PRN
  Administered 2022-04-01: 500 [IU]
  Filled 2022-04-01: qty 5

## 2022-04-01 MED ORDER — HEPARIN SOD (PORK) LOCK FLUSH 100 UNIT/ML IV SOLN
500.0000 [IU] | Freq: Once | INTRAVENOUS | Status: AC
  Administered 2022-04-01: 500 [IU] via INTRAVENOUS
  Filled 2022-04-01: qty 5

## 2022-04-01 MED ORDER — LONSURF 20-8.19 MG PO TABS
35.0000 mg/m2 | ORAL_TABLET | Freq: Two times a day (BID) | ORAL | 2 refills | Status: DC
  Filled 2022-04-01: qty 60, 10d supply, fill #0
  Filled 2022-04-02: qty 60, 28d supply, fill #0

## 2022-04-01 MED ORDER — POTASSIUM CHLORIDE 20 MEQ/100ML IV SOLN
20.0000 meq | Freq: Once | INTRAVENOUS | Status: AC
  Administered 2022-04-01: 20 meq via INTRAVENOUS

## 2022-04-01 NOTE — Progress Notes (Signed)
Pt states that it is becoming more difficult to grip things in her hands; not often but has noticed. ?

## 2022-04-01 NOTE — Telephone Encounter (Signed)
Oral Oncology Patient Advocate Encounter ?  ?Received notification from [employer] that prior authorization for Lonsurf is required. ?  ?PA submitted on CoverMyMeds ?Key [id number]  ?Status is pending ?  ?Oral Oncology Clinic will continue to follow. ? ?Dennison Nancy CPHT ?Specialty Pharmacy Patient Advocate ?[place] ?Phone [phone] ?Fax [phone] ?04/01/2022 2:46 PM ? ?

## 2022-04-01 NOTE — Progress Notes (Signed)
? ?  ?Palliative Medicine ?[place] [facility] ?Telephone:(336) [phone] Fax:(336) [phone] ? ? ?Name: Alicia Coleman ?Date: 04/01/2022 ?MRN: [id number]  ?DOB: 09-09-1956 ? ?Patient Care Team: ?Pcp, No as PCP - General ?Clent Jacks, RN as Oncology Nurse Navigator ?Sindy Guadeloupe, MD as Consulting Physician (Oncology) ?[name], MD as Consulting Physician (Gastroenterology) ?Jules Husbands, MD as Consulting Physician (General Surgery)  ? ? ?REASON FOR CONSULTATION: ?Alicia Coleman is a 66 y.o. female with multiple medical problems including metastatic colon cancer with liver metastasis started on FOLFOX chemotherapy in June 2022 with disease progression noted in January 2023 and patient was rotated to FOLFIRI plus Bev.  CTs in February 2023 are concerning for disease progression.  Patient has had declining performance status.  She is referred to palliative care to help address goals. ? ?SOCIAL HISTORY:    ? reports that she has never smoked. She has never used smokeless tobacco. She reports that she does not currently use alcohol. She reports that she does not use drugs. ? ?Patient is unmarried and has no children.  She lives at home with her brother.  She has multiple siblings who are involved in her care. ? ?ADVANCE DIRECTIVES:  ?Does not have ? ?CODE STATUS: DNR/DNI (DNR form completed on 03/11/2022) ? ?PAST MEDICAL HISTORY: ?Past Medical History:  ?Diagnosis Date  ? Allergy   ? Anemia   ? Cancer [facility])   ? Colon cancer ([place])   ? GI bleed   ? ? ?PAST SURGICAL HISTORY:  ?Past Surgical History:  ?Procedure Laterality Date  ? COLONOSCOPY WITH PROPOFOL N/A 01/16/2022  ? Procedure: COLONOSCOPY WITH PROPOFOL;  Surgeon: [name], MD;  Location: [facility] ENDOSCOPY;  Service: Gastroenterology;  Laterality: N/A;  ? ESOPHAGOGASTRODUODENOSCOPY  01/16/2022  ? Procedure: ESOPHAGOGASTRODUODENOSCOPY (EGD);  Surgeon: [name], MD;  Location: [facility] ENDOSCOPY;  Service: Gastroenterology;;  ? [place] N/A 04/24/2021  ? Procedure: FLEXIBLE SIGMOIDOSCOPY;  Surgeon: [name], MD;  Location: [facility] ENDOSCOPY;  Service: Gastroenterology;  Laterality: N/A;  ? IR CV LINE INJECTION  11/13/2021  ? IR IMAGING GUIDED PORT INSERTION  11/19/2021  ? IR REMOVAL TUN ACCESS W/ PORT W/O FL MOD SED  11/19/2021  ? PORTACATH PLACEMENT N/A 05/01/2021  ? Procedure: INSERTION PORT-A-CATH;  Surgeon: Jules Husbands, MD;  Location: [facility] ORS;  Service: General;  Laterality: N/A;  ? ? ?HEMATOLOGY/ONCOLOGY HISTORY:  ?Oncology History  ?Primary malignant neoplasm of colon with metastasis in female [facility])  ?04/25/2021 Initial Diagnosis  ? Metastatic colon cancer in female [facility]) ?  ?04/25/2021 Cancer Staging  ? Staging form: Colon and Rectum, AJCC 8th Edition ?- Clinical stage from 04/25/2021: Stage IVC (cT4b, cN2, cM1c) - Signed by Sindy Guadeloupe, MD on 04/25/2021 ?Stage prefix: Initial diagnosis ?  ?05/19/2021 -  Chemotherapy  ? Patient is on Treatment Plan : COLORECTAL FOLFOX + Bevacizumab q14d  ?   ? ? ?ALLERGIES:  is allergic to oxaliplatin. ? ?MEDICATIONS:  ?Current Outpatient Medications  ?Medication Sig Dispense Refill  ? acetaminophen (TYLENOL) 325 MG tablet Take 650 mg by mouth every 6 (six) hours as needed.    ? feeding supplement (ENSURE ENLIVE / ENSURE PLUS) LIQD Take 237 mLs by mouth 3 (three) times daily between meals. (Patient not taking: Reported on 04/01/2022) 14220 mL 0  ? lidocaine-prilocaine (EMLA) cream Apply 1 application. topically as needed. Apply to port and cover with saran wrap 1-2 hours prior to port access 30 g 4  ? Multiple Vitamin (MULTIVITAMIN  WITH MINERALS) TABS tablet Take 1 tablet by mouth daily. 30 tablet 0  ? phosphorus (K PHOS NEUTRAL) 155-852-130 MG tablet Take 2 tablets (500 mg total) by mouth 3 (three) times daily. 90 tablet 1  ? potassium chloride SA (KLOR-CON M) 20 MEQ tablet Take 20 mEq by mouth daily. (Patient not taking: Reported on 04/01/2022)    ? predniSONE (DELTASONE) 10 MG tablet Take 1  tablet (10 mg total) by mouth daily with breakfast. (Patient not taking: Reported on 04/01/2022) 14 tablet 0  ? Simethicone (GAS-X MAXIMUM STRENGTH PO) Take by mouth.    ? ?No current facility-administered medications for this visit.  ? ?Facility-Administered Medications Ordered in Other Visits  ?Medication Dose Route Frequency Provider Last Rate Last Admin  ? 0.9 %  sodium chloride infusion   Intravenous Once Sindy Guadeloupe, MD      ? dexamethasone (DECADRON) 10 mg in sodium chloride 0.9 % 50 mL IVPB  10 mg Intravenous Once Sindy Guadeloupe, MD      ? ? ?VITAL SIGNS: ?LMP  (LMP Unknown)  ?There were no vitals filed for this visit.  ?Estimated body mass index is 18.46 kg/m? as calculated from the following: ?  Height as of 03/03/22: '5\' 6"'$  (1.676 m). ?  Weight as of an earlier encounter on 04/01/22: 114 lb 6.4 oz (51.9 kg). ? ?LABS: ?CBC: ?   ?Component Value Date/Time  ? WBC 17.5 (H) 04/01/2022 0910  ? HGB 6.7 (L) 04/01/2022 0910  ? HCT 21.8 (L) 04/01/2022 0910  ? PLT 299 04/01/2022 0910  ? MCV 90.5 04/01/2022 0910  ? NEUTROABS 15.8 (H) 04/01/2022 0910  ? LYMPHSABS 0.6 (L) 04/01/2022 0910  ? MONOABS 0.8 04/01/2022 0910  ? EOSABS 0.1 04/01/2022 0910  ? BASOSABS 0.0 04/01/2022 0910  ? ?Comprehensive Metabolic Panel: ?   ?Component Value Date/Time  ? NA 135 04/01/2022 0910  ? K 2.6 (LL) 04/01/2022 0910  ? CL 100 04/01/2022 0910  ? CO2 28 04/01/2022 0910  ? BUN 9 04/01/2022 0910  ? CREATININE 0.42 (L) 04/01/2022 0910  ? GLUCOSE 81 04/01/2022 0910  ? CALCIUM 7.8 (L) 04/01/2022 0910  ? AST 29 04/01/2022 0910  ? ALT 14 04/01/2022 0910  ? ALKPHOS 184 (H) 04/01/2022 0910  ? BILITOT 0.5 04/01/2022 0910  ? PROT 5.8 (L) 04/01/2022 0910  ? ALBUMIN 1.6 (L) 04/01/2022 0910  ? ? ?RADIOGRAPHIC STUDIES: ?CT CHEST ABDOMEN PELVIS W CONTRAST ? ?Result Date: 03/27/2022 ?CLINICAL DATA:  Restaging metastatic colon cancer. * Tracking Code: BO * EXAM: CT CHEST, ABDOMEN, AND PELVIS WITH CONTRAST TECHNIQUE: Multidetector CT imaging of the chest,  abdomen and pelvis was performed following the standard protocol during bolus administration of intravenous contrast. RADIATION DOSE REDUCTION: This exam was performed according to the departmental dose-optimization program which includes automated exposure control, adjustment of the mA and/or kV according to patient size and/or use of iterative reconstruction technique. CONTRAST:  55m OMNIPAQUE IOHEXOL 300 MG/ML  SOLN COMPARISON:  Multiple priors including most recent CTs January 14, 2022 and December 15, 2021 FINDINGS: CT CHEST FINDINGS Cardiovascular: Accessed left chest Port-A-Cath with tip at the superior cavoatrial junction. Normal caliber thoracic aorta. No central pulmonary embolus on this nondedicated study. Normal size heart. No significant pericardial effusion/thickening. Mediastinum/Nodes: No supraclavicular adenopathy. No discrete thyroid nodule. No pathologically enlarged mediastinal, hilar or axillary lymph nodes. Esophagus is grossly unremarkable. Lungs/Pleura: No suspicious pulmonary nodules or masses. No pleural effusion. No pneumothorax. Musculoskeletal: No suspicious chest wall lesion. No aggressive lytic  or blastic lesion of bone. CT ABDOMEN PELVIS FINDINGS Hepatobiliary: Increased size of the segment VI hepatic metastatic lesion which now measures 7.5 x 7.4 cm on image 67/2 previously 5.8 x 5.4 cm. No new suspicious hepatic lesion identified. No evidence of acute cholecystitis. No biliary ductal dilation. Pancreas: No pancreatic ductal dilation or evidence of acute inflammation. Spleen: No splenomegaly or focal splenic lesion. Adrenals/Urinary Tract: Bilateral adrenal glands appear normal. New mild left hydroureteronephrosis to the level of the pelvic mass with subtle hypoenhancement of the left kidney no right-sided hydronephrosis. Urinary bladder is unremarkable for degree of distension. Stomach/Bowel: Radiopaque enteric contrast material traverses the hepatic flexure. Stomach is minimally  distended limiting evaluation. No pathologic dilation of bowel. Surgical changes of double barrel ostomy in the left lower quadrant, diverting colostomy. Downstream from the diverting colostomy is a large colonic mass

## 2022-04-01 NOTE — Progress Notes (Signed)
Patient received a totla of 40 meQ K today, tolerated infusion well, no questions/concerns voiced. Patient stable at discharge and aware of tomorrow's appointment. AVS given.   ?

## 2022-04-01 NOTE — Telephone Encounter (Signed)
Oral Oncology Pharmacist Encounter ? ?Received new prescription for Lonsurf (trifluridine/tipiracil) for the treatment of metastatic colon cancer, planned duration until disease progression or unacceptable drug toxicity. ? ?CMP/CBC from 04/01/22 assessed, no relevant lab abnormalities. Potassium low but replacement ordered. Prescription dose and frequency assessed.  ? ?Current medication list in Epic reviewed, no DDIs with Lonsurf identified. ? ?Evaluated chart and no patient barriers to medication adherence identified.  ? ?Prescription has been e-scribed to the [facility] for benefits analysis and approval. ? ?Oral Oncology Clinic will continue to follow for insurance authorization, copayment issues, initial counseling and start date. ? ?Patient agreed to treatment on 04/01/22 per MD documentation. ? ?Darl Pikes, PharmD, BCPS, BCOP, CPP ?Hematology/Oncology Clinical Pharmacist Practitioner ?[place]/DB/AP Oral Chemotherapy Navigation Clinic ?[phone] ? ?04/01/2022 12:49 PM ? ?

## 2022-04-01 NOTE — Telephone Encounter (Signed)
Oral Oncology Patient Advocate Encounter ? ?Prior Authorization for Alicia Coleman has been approved.   ? ?PA# [id number] ?Effective dates: 04/01/22 through 04/02/23 ? ?Patients co-pay is $90.73. Obtained copay card to reduce patients OOP to $0. ? ?Patient can fill 1 time at [facility] and must use [employer] [facility] for further fills. ? ?Oral Oncology Clinic will continue to follow.  ? ?Dennison Nancy CPHT ?Specialty Pharmacy Patient Advocate ?[place] ?Phone [phone] ?Fax [phone] ?04/01/2022 2:50 PM ? ?

## 2022-04-01 NOTE — Patient Instructions (Signed)
Potassium Chloride Injection What is this medication? POTASSIUM CHLORIDE (poe TASS i um KLOOR ide) prevents and treats low levels of potassium in your body. Potassium plays an important role in maintaining the health of your kidneys, heart, muscles, and nervous system. This medicine may be used for other purposes; ask your health care provider or pharmacist if you have questions. COMMON BRAND NAME(S): PROAMP What should I tell my care team before I take this medication? They need to know if you have any of these conditions: Addison disease Dehydration Diabetes (high blood sugar) Heart disease High levels of potassium in the blood Irregular heartbeat or rhythm Kidney disease Large areas of burned skin An unusual or allergic reaction to potassium, other medications, foods, dyes, or preservatives Pregnant or trying to get pregnant Breast-feeding How should I use this medication? This medication is injected into a vein. It is given in a hospital or clinic setting. Talk to your care team about the use of this medication in children. Special care may be needed. Overdosage: If you think you have taken too much of this medicine contact a poison control center or emergency room at once. NOTE: This medicine is only for you. Do not share this medicine with others. What if I miss a dose? This does not apply. This medication is not for regular use. What may interact with this medication? Do not take this medication with any of the following: Certain diuretics such as spironolactone, triamterene Eplerenone Sodium polystyrene sulfonate This medication may also interact with the following: Certain medications for blood pressure or heart disease like lisinopril, losartan, quinapril, valsartan Medications that lower your chance of fighting infection such as cyclosporine, tacrolimus NSAIDs, medications for pain and inflammation, like ibuprofen or naproxen Other potassium supplements Salt  substitutes This list may not describe all possible interactions. Give your health care provider a list of all the medicines, herbs, non-prescription drugs, or dietary supplements you use. Also tell them if you smoke, drink alcohol, or use illegal drugs. Some items may interact with your medicine. What should I watch for while using this medication? Visit your care team for regular checks on your progress. Tell your care team if your symptoms do not start to get better or if they get worse. You may need blood work while you are taking this medication. Avoid salt substitutes unless you are told otherwise by your care team. What side effects may I notice from receiving this medication? Side effects that you should report to your care team as soon as possible: Allergic reactions--skin rash, itching, hives, swelling of the face, lips, tongue, or throat High potassium level--muscle weakness, fast or irregular heartbeat Side effects that usually do not require medical attention (report to your care team if they continue or are bothersome): Diarrhea Nausea Stomach pain Vomiting This list may not describe all possible side effects. Call your doctor for medical advice about side effects. You may report side effects to FDA at 1-800-FDA-1088. Where should I keep my medication? This medication is given in a hospital or clinic. It will not be stored at home. NOTE: This sheet is a summary. It may not cover all possible information. If you have questions about this medicine, talk to your doctor, pharmacist, or health care provider.  2023 Elsevier/Gold Standard (2021-03-04 00:00:00)  

## 2022-04-01 NOTE — Progress Notes (Signed)
? ? ? ?Hematology/Oncology Consult note ?Alicia Coleman  ?[phone] [phone] ? ?Patient Care Team: ?Pcp, No as PCP - General ?Clent Jacks, RN as Oncology Nurse Navigator ?Sindy Guadeloupe, MD as Consulting Physician (Oncology) ?[name], MD as Consulting Physician (Gastroenterology) ?Jules Husbands, MD as Consulting Physician (General Surgery)  ? ?Name of the patient: Alicia Coleman  ?[id number]  ?[birth year]  ? ?Date of visit: 04/01/22 ? ?Diagnosis- metastatic colon cancer with liver metastases ?  ? ?Chief complaint/ Reason for visit-discuss further management of colon cancer ? ?Heme/Onc history: Patient is a 66 year old female who was referred from the ER for severe iron deficiency anemia.  She received IV Venofer for that.  I was concerned about malignancy given the degree of anemia and therefore obtain CT abdomen and pelvis with contrast CT showed 6.4 cm enhancing mass in the right liver and another mass measuring 4 cm.  Large volume stool in the right colon transverse and descending colon with irregular mass lesion in the distal sigmoid colon extending into the rectosigmoid junction with lateral extracolonic extension into the pelvic sidewall.  Apparent luminal narrowing at the level of the lesion which accounts for large volume of stool proximally.  Multiple pericolonic and perirectal lymph nodes.  Multiple peritoneal nodules are seen in the lower right pelvic sidewall.  Colon mass appears to be invading posterior uterus.Thrombosis of the superior rectal vein may be bland thrombus although direct tumor invasion not excluded. ?  ?Patient had a flexible sigmoidoscopy which showed a large obstructing mass in the rectosigmoid colon.  Scope could not be traversed beyond the obstruction.  Biopsies were taken and were consistent with adenocarcinoma. Patient had a diverting colostomy on 05/01/2021 with Dr. Dahlia Byes ?  ?Patient started palliative FOLFOX chemotherapy in June 2022  with plans to add Avastin after 6 cycles of treatment.  Patient developed allergic reaction to oxaliplatin with cycle 10 as evidenced by diaphoresis vomiting and flushing.  Plan is to hold off on further doses of oxaliplatin.  She has K-ras mutation positive ?  ?Progression noted on scans in January 2023.  Plan to switch to FOLFIRI bevacizumab.  Bevacizumab was stopped after there was concern for bleeding from the primary colorectal mass.  Chemotherapy on hold since 02/17/2022 due to poor performance status ?  ? ?Interval history-states that she is trying to eat better and walk around the house more although she spends most of her time resting.  She has not noticed any bleeding in her stool or urine.  Output through colostomy has been normal. ? ?ECOG PS- 2-3 ?Pain scale- 0 ?Opioid associated constipation- no ? ?Review of systems- Review of Systems  ?Constitutional:  Positive for malaise/fatigue. Negative for chills, fever and weight loss.  ?HENT:  Negative for congestion, ear discharge and nosebleeds.   ?Eyes:  Negative for blurred vision.  ?Respiratory:  Negative for cough, hemoptysis, sputum production, shortness of breath and wheezing.   ?Cardiovascular:  Negative for chest pain, palpitations, orthopnea and claudication.  ?Gastrointestinal:  Negative for abdominal pain, blood in stool, constipation, diarrhea, heartburn, melena, nausea and vomiting.  ?Genitourinary:  Negative for dysuria, flank pain, frequency, hematuria and urgency.  ?Musculoskeletal:  Negative for back pain, joint pain and myalgias.  ?Skin:  Negative for rash.  ?Neurological:  Negative for dizziness, tingling, focal weakness, seizures, weakness and headaches.  ?Endo/Heme/Allergies:  Does not bruise/bleed easily.  ?Psychiatric/Behavioral:  Negative for depression and suicidal ideas. The patient does not have insomnia.    ? ? ? ?  Allergies  ?Allergen Reactions  ? Oxaliplatin Nausea And Vomiting  ? ? ? ?Past Medical History:  ?Diagnosis Date  ?  Allergy   ? Anemia   ? Cancer [facility])   ? Colon cancer ([place])   ? GI bleed   ? ? ? ?Past Surgical History:  ?Procedure Laterality Date  ? COLONOSCOPY WITH PROPOFOL N/A 01/16/2022  ? Procedure: COLONOSCOPY WITH PROPOFOL;  Surgeon: [name], MD;  Location[facility] ENDOSCOPY;  Service: Gastroenterology;  Laterality: N/A;  ? ESOPHAGOGASTRODUODENOSCOPY  01/16/2022  ? Procedure: ESOPHAGOGASTRODUODENOSCOPY (EGD);  Surgeon: [name], MD;  Location: [facility] ENDOSCOPY;  Service: Gastroenterology;;  ? [place] N/A 04/24/2021  ? Procedure: FLEXIBLE SIGMOIDOSCOPY;  Surgeon: [name], MD;  Location: [facility] ENDOSCOPY;  Service: Gastroenterology;  Laterality: N/A;  ? IR CV LINE INJECTION  11/13/2021  ? IR IMAGING GUIDED PORT INSERTION  11/19/2021  ? IR REMOVAL TUN ACCESS W/ PORT W/O FL MOD SED  11/19/2021  ? PORTACATH PLACEMENT N/A 05/01/2021  ? Procedure: INSERTION PORT-A-CATH;  Surgeon: Jules Husbands, MD;  Location: [facility] ORS;  Service: General;  Laterality: N/A;  ? ? ?Social History  ? ?Socioeconomic History  ? Marital status: Married  ?  Spouse name: Not on file  ? Number of children: Not on file  ? Years of education: Not on file  ? Highest education level: Not on file  ?Occupational History  ? Not on file  ?Tobacco Use  ? Smoking status: Never  ? Smokeless tobacco: Never  ?Vaping Use  ? Vaping Use: Never used  ?Substance and Sexual Activity  ? Alcohol use: Not Currently  ? Drug use: Never  ? Sexual activity: Not Currently  ?Other Topics Concern  ? Not on file  ?Social History Narrative  ? Not on file  ? ?Social Determinants of Health  ? ?Financial Resource Strain: Not on file  ?Food Insecurity: Not on file  ?Transportation Needs: Not on file  ?Physical Activity: Not on file  ?Stress: Not on file  ?Social Connections: Not on file  ?Intimate Partner Violence: Not on file  ? ? ?Family History  ?Problem Relation Age of Onset  ? Stroke Mother   ? Hypertension Mother   ? Cancer Father   ? ? ? ?Current Outpatient Medications:   ?  acetaminophen (TYLENOL) 325 MG tablet, Take 650 mg by mouth every 6 (six) hours as needed., Disp: , Rfl:  ?  lidocaine-prilocaine (EMLA) cream, Apply 1 application. topically as needed. Apply to port and cover with saran wrap 1-2 hours prior to port access, Disp: 30 g, Rfl: 4 ?  Multiple Vitamin (MULTIVITAMIN WITH MINERALS) TABS tablet, Take 1 tablet by mouth daily., Disp: 30 tablet, Rfl: 0 ?  phosphorus (K PHOS NEUTRAL) 155-852-130 MG tablet, Take 2 tablets (500 mg total) by mouth 3 (three) times daily., Disp: 90 tablet, Rfl: 1 ?  Simethicone (GAS-X MAXIMUM STRENGTH PO), Take by mouth., Disp: , Rfl:  ?  trifluridine-tipiracil (LONSURF) 15-6.14 MG tablet, Take 4 tablets (60 mg of trifluridine total) by mouth 2 (two) times daily after a meal. 1 hr after AM & PM meals on days 1-5, Repeat every 14 days, Disp: 10 tablet, Rfl: 5 ?  feeding supplement (ENSURE ENLIVE / ENSURE PLUS) LIQD, Take 237 mLs by mouth 3 (three) times daily between meals. (Patient not taking: Reported on 04/01/2022), Disp: 14220 mL, Rfl: 0 ?  potassium chloride SA (KLOR-CON M) 20 MEQ tablet, Take 20 mEq by mouth daily. (Patient not taking: Reported on 04/01/2022),  Disp: , Rfl:  ?  predniSONE (DELTASONE) 10 MG tablet, Take 1 tablet (10 mg total) by mouth daily with breakfast. (Patient not taking: Reported on 04/01/2022), Disp: 14 tablet, Rfl: 0 ?No current facility-administered medications for this visit. ? ?Facility-Administered Medications Ordered in Other Visits:  ?  0.9 %  sodium chloride infusion, , Intravenous, Once, Sindy Guadeloupe, MD ?  dexamethasone (DECADRON) 10 mg in sodium chloride 0.9 % 50 mL IVPB, 10 mg, Intravenous, Once, Sindy Guadeloupe, MD ?  potassium chloride 20 mEq in 100 mL IVPB, 20 mEq, Intravenous, Once, Sindy Guadeloupe, MD, Last Rate: 100 mL/hr at 04/01/22 1223, 20 mEq at 04/01/22 1223 ? ?Physical exam:  ?Vitals:  ? 04/01/22 0953  ?BP: 114/82  ?Pulse: 99  ?Resp: 16  ?Temp: (!) 97.5 ?F (36.4 ?C)  ?SpO2: 100%  ?Weight: 114 lb 6.4 oz  (51.9 kg)  ? ?Physical Exam ?Constitutional:   ?   Comments: Thin cachectic female sitting in wheelchair.  Appears in no acute distress  ?Cardiovascular:  ?   Rate and Rhythm: Normal rate and regular rhy

## 2022-04-02 ENCOUNTER — Inpatient Hospital Stay: Payer: BC Managed Care – PPO

## 2022-04-02 ENCOUNTER — Other Ambulatory Visit (HOSPITAL_COMMUNITY): Payer: Self-pay

## 2022-04-02 ENCOUNTER — Ambulatory Visit: Payer: BC Managed Care – PPO

## 2022-04-02 DIAGNOSIS — D649 Anemia, unspecified: Secondary | ICD-10-CM

## 2022-04-02 DIAGNOSIS — C189 Malignant neoplasm of colon, unspecified: Secondary | ICD-10-CM | POA: Diagnosis not present

## 2022-04-02 LAB — CEA: CEA: 1422 ng/mL — ABNORMAL HIGH (ref 0.0–4.7)

## 2022-04-02 MED ORDER — HEPARIN SOD (PORK) LOCK FLUSH 100 UNIT/ML IV SOLN
500.0000 [IU] | Freq: Every day | INTRAVENOUS | Status: AC | PRN
  Administered 2022-04-02: 500 [IU]
  Filled 2022-04-02: qty 5

## 2022-04-02 MED ORDER — SODIUM CHLORIDE 0.9% IV SOLUTION
250.0000 mL | Freq: Once | INTRAVENOUS | Status: AC
  Administered 2022-04-02: 250 mL via INTRAVENOUS
  Filled 2022-04-02: qty 250

## 2022-04-02 MED ORDER — HEPARIN SOD (PORK) LOCK FLUSH 100 UNIT/ML IV SOLN
INTRAVENOUS | Status: AC
  Filled 2022-04-02: qty 5

## 2022-04-02 MED ORDER — ACETAMINOPHEN 325 MG PO TABS
650.0000 mg | ORAL_TABLET | Freq: Once | ORAL | Status: AC
  Administered 2022-04-02: 650 mg via ORAL
  Filled 2022-04-02: qty 2

## 2022-04-02 NOTE — Progress Notes (Signed)
Nutrition Follow-up: ? ?Patient with metastatic colon cancer with liver metastases.  S/p diverting colostomy on 05/01/21.  Planning to start lonsurf.   ? ?Met with patient while getting blood today.  Says that her appetite is coming back.  Her sister has been preparing meals for her.  She says she has been drinking a smoothie made with ensure not daily, eating steak, potatoes, greens, peanut butter crackers, banana sandwich.   ? ?Medications: reviewed ? ?Labs: K 2.6, creatinine 0.42, Ca 7.8, albumin 1.6, phoshorus 2.3 (4/18) ? ?Anthropometrics:  ? ?Weight 114 lb 6.4 oz (edema in ankles) ? ?102 lb 14.4 oz on 4/18 ?107 lb 11.2 ?114 lb 2/22 ? ? ?NUTRITION DIAGNOSIS: Inadequate oral intake ongoing ? ? ?MALNUTRITION DIAGNOSIS: severe malnutrition continues ? ? ?INTERVENTION:  ?Encouraged patient to drink smoothie with ensure daily if able to tolerate for additional calories and protein ?Encouraged high calories and protein foods that she is able to tolerate to improve nutrition ?  ? ?MONITORING, EVALUATION, GOAL: weight trends, intake ? ? ?NEXT VISIT: Friday, May 26 during infusion ? ?Mico Spark B. Zenia Resides, RD, LDN ?Registered Dietitian ?[phone] ? ? ?

## 2022-04-02 NOTE — Patient Instructions (Signed)
Blood Transfusion, Adult A blood transfusion is a procedure in which you receive blood or a type of blood cell (blood component) through an IV. You may need a blood transfusion when your blood level is low. This may result from a bleeding disorder, illness, injury, or surgery. The blood may come from a donor. You may also be able to donate blood for yourself (autologous blood donation) before a planned surgery. The blood given in a transfusion is made up of different blood components. You may receive: Red blood cells. These carry oxygen to the cells in the body. Platelets. These help your blood to clot. Plasma. This is the liquid part of your blood. It carries proteins and other substances throughout the body. White blood cells. These help you fight infections. If you have hemophilia or another clotting disorder, you may also receive other types of blood products. Tell a health care provider about: Any blood disorders you have. Any previous reactions you have had during a blood transfusion. Any allergies you have. All medicines you are taking, including vitamins, herbs, eye drops, creams, and over-the-counter medicines. Any surgeries you have had. Any medical conditions you have, including any recent fever or cold symptoms. Whether you are pregnant or may be pregnant. What are the risks? Generally, this is a safe procedure. However, problems may occur. The most common problems include: A mild allergic reaction, such as red, swollen areas of skin (hives) and itching. Fever or chills. This may be the body's response to new blood cells received. This may occur during or up to 4 hours after the transfusion. More serious problems may include: Transfusion-associated circulatory overload (TACO), or too much fluid in the lungs. This may cause breathing problems. A serious allergic reaction, such as difficulty breathing or swelling around the face and lips. Transfusion-related acute lung injury  (TRALI), which causes breathing difficulty and low oxygen in the blood. This can occur within hours of the transfusion or several days later. Iron overload. This can happen after receiving many blood transfusions over a period of time. Infection or virus being transmitted. This is rare because donated blood is carefully tested before it is given. Hemolytic transfusion reaction. This is rare. It happens when your body's defense system (immune system)tries to attack the new blood cells. Symptoms may include fever, chills, nausea, low blood pressure, and low back or chest pain. Transfusion-associated graft-versus-host disease (TAGVHD). This is rare. It happens when donated cells attack your body's healthy tissues. What happens before the procedure? Medicines Ask your health care provider about: Changing or stopping your regular medicines. This is especially important if you are taking diabetes medicines or blood thinners. Taking medicines such as aspirin and ibuprofen. These medicines can thin your blood. Do not take these medicines unless your health care provider tells you to take them. Taking over-the-counter medicines, vitamins, herbs, and supplements. General instructions Follow instructions from your health care provider about eating and drinking restrictions. You will have a blood test to determine your blood type. This is necessary to know what kind of blood your body will accept and to match it to the donor blood. If you are going to have a planned surgery, you may be able to do an autologous blood donation. This may be done in case you need to have a transfusion. You will have your temperature, blood pressure, and pulse monitored before the transfusion. If you have had an allergic reaction to a transfusion in the past, you may be given medicine to help prevent   a reaction. This medicine may be given to you by mouth (orally) or through an IV. Set aside time for the blood transfusion. This  procedure generally takes 1-4 hours to complete. What happens during the procedure?  An IV will be inserted into one of your veins. The bag of donated blood will be attached to your IV. The blood will then enter through your vein. Your temperature, blood pressure, and pulse will be monitored regularly during the transfusion. This monitoring is done to detect early signs of a transfusion reaction. Tell your nurse right away if you have any of these symptoms during the transfusion: Shortness of breath or trouble breathing. Chest or back pain. Fever or chills. Hives or itching. If you have any signs or symptoms of a reaction, your transfusion will be stopped and you may be given medicine. When the transfusion is complete, your IV will be removed. Pressure may be applied to the IV site for a few minutes. A bandage (dressing)will be applied. The procedure may vary among health care providers and hospitals. What happens after the procedure? Your temperature, blood pressure, pulse, breathing rate, and blood oxygen level will be monitored until you leave the hospital or clinic. Your blood may be tested to see how you are responding to the transfusion. You may be warmed with fluids or blankets to maintain a normal body temperature. If you receive your blood transfusion in an outpatient setting, you will be told whom to contact to report any reactions. Where to find more information For more information on blood transfusions, visit the American Red Cross: redcross.org Summary A blood transfusion is a procedure in which you receive blood or a type of blood cell (blood component) through an IV. The blood you receive may come from a donor or be donated by yourself (autologous blood donation) before a planned surgery. The blood given in a transfusion is made up of different blood components. You may receive red blood cells, platelets, plasma, or white blood cells depending on the condition treated. Your  temperature, blood pressure, and pulse will be monitored before, during, and after the transfusion. After the transfusion, your blood may be tested to see how your body has responded. This information is not intended to replace advice given to you by your health care provider. Make sure you discuss any questions you have with your health care provider. Document Revised: 09/21/2019 Document Reviewed: 05/11/2019 Elsevier Patient Education  2023 Elsevier Inc.  

## 2022-04-02 NOTE — Telephone Encounter (Signed)
Oral Chemotherapy Pharmacist Encounter ? ?[facility] will deliver medication to Ms. Reasons tomorrow 04/02/22. She will get started on the [facility] tomorrow 04/06/22. She will need to get future fills from [employer] ? ?Patient Education ?I spoke with patient and her sister for overview of new oral chemotherapy medication: Lonsurf (trifluridine/tipiracil) for the treatment of metastatic colon cancer, planned duration until disease progression or unacceptable drug toxicity.  ? ?Counseled patient on administration, dosing, side effects, monitoring, drug-food interactions, safe handling, storage, and disposal. ?Patient will take 3 tablets (60 mg of trifluridine total) by mouth 2 (two) times daily after a meal. Take within 1 hr of AM & PM meals on days 1-5. Repeat every 14 days. ? ?Side effects include but not limited to: fatigue, diarrhea, nausea, decreased wbc/hgb/plt.   ? ?Reviewed with patient importance of keeping a medication schedule and plan for any missed doses. ? ?After discussion with patient no patient barriers to medication adherence identified.  ? ?Ms. Ohern voiced understanding and appreciation. All questions answered. Medication handout provided. ? ?Provided patient with [facility] phone number. Patient knows to call the office with questions or concerns. Oral Chemotherapy [facility] will continue to follow. ? ?Darl Pikes, PharmD, BCPS, BCOP, CPP ?Hematology/Oncology Clinical Pharmacist Practitioner ?[place]/[facility] Oral Chemotherapy [facility] ?[phone] ? ?04/02/2022 2:17 PM ? ?

## 2022-04-03 LAB — TYPE AND SCREEN
ABO/RH(D): O POS
Antibody Screen: NEGATIVE
Unit division: 0

## 2022-04-03 LAB — BPAM RBC
Blood Product Expiration Date: 202306012359
ISSUE DATE / TIME: 202305040936
Unit Type and Rh: 5100

## 2022-04-03 NOTE — Addendum Note (Signed)
Addended by: Altha Harm R on: 04/03/2022 11:52 AM ? ? Modules accepted: Orders ? ?

## 2022-04-07 ENCOUNTER — Telehealth: Payer: Self-pay | Admitting: Pharmacist

## 2022-04-07 NOTE — Telephone Encounter (Signed)
Oral Chemotherapy Pharmacist Encounter  ? ?Patient's sister called to report that the patient was experiencing abdominal pain. Patient's last bowel movement was 2-3 days ago and she has been eating. They have Miralax at home, instructed her to have Alicia Coleman take some Mirlax tohelp her having a bowel movement. If this does not work or does not bring relief, instructed them to call back tomorrow.  ? ? ?Darl Pikes, PharmD, BCPS, BCOP, CPP ?Hematology/Oncology Clinical Pharmacist ?[place]/[facility] Oral Chemotherapy [facility] ?[phone] ? ?04/07/2022 9:47 AM ? ?

## 2022-04-09 ENCOUNTER — Inpatient Hospital Stay: Payer: BC Managed Care – PPO

## 2022-04-09 ENCOUNTER — Inpatient Hospital Stay (HOSPITAL_BASED_OUTPATIENT_CLINIC_OR_DEPARTMENT_OTHER): Payer: BC Managed Care – PPO | Admitting: Hospice and Palliative Medicine

## 2022-04-09 ENCOUNTER — Other Ambulatory Visit: Payer: Self-pay | Admitting: *Deleted

## 2022-04-09 ENCOUNTER — Encounter: Payer: Self-pay | Admitting: Hospice and Palliative Medicine

## 2022-04-09 DIAGNOSIS — E860 Dehydration: Secondary | ICD-10-CM

## 2022-04-09 DIAGNOSIS — C189 Malignant neoplasm of colon, unspecified: Secondary | ICD-10-CM | POA: Diagnosis not present

## 2022-04-09 DIAGNOSIS — D649 Anemia, unspecified: Secondary | ICD-10-CM

## 2022-04-09 DIAGNOSIS — E538 Deficiency of other specified B group vitamins: Secondary | ICD-10-CM

## 2022-04-09 LAB — CBC WITH DIFFERENTIAL/PLATELET
Abs Immature Granulocytes: 0.1 10*3/uL — ABNORMAL HIGH (ref 0.00–0.07)
Basophils Absolute: 0 10*3/uL (ref 0.0–0.1)
Basophils Relative: 0 %
Eosinophils Absolute: 0.1 10*3/uL (ref 0.0–0.5)
Eosinophils Relative: 1 %
HCT: 23.9 % — ABNORMAL LOW (ref 36.0–46.0)
Hemoglobin: 7.4 g/dL — ABNORMAL LOW (ref 12.0–15.0)
Immature Granulocytes: 1 %
Lymphocytes Relative: 4 %
Lymphs Abs: 0.7 10*3/uL (ref 0.7–4.0)
MCH: 27.9 pg (ref 26.0–34.0)
MCHC: 31 g/dL (ref 30.0–36.0)
MCV: 90.2 fL (ref 80.0–100.0)
Monocytes Absolute: 0.7 10*3/uL (ref 0.1–1.0)
Monocytes Relative: 4 %
Neutro Abs: 15.7 10*3/uL — ABNORMAL HIGH (ref 1.7–7.7)
Neutrophils Relative %: 90 %
Platelets: 290 10*3/uL (ref 150–400)
RBC: 2.65 MIL/uL — ABNORMAL LOW (ref 3.87–5.11)
RDW: 15.8 % — ABNORMAL HIGH (ref 11.5–15.5)
WBC: 17.3 10*3/uL — ABNORMAL HIGH (ref 4.0–10.5)
nRBC: 0 % (ref 0.0–0.2)

## 2022-04-09 LAB — COMPREHENSIVE METABOLIC PANEL
ALT: 15 U/L (ref 0–44)
AST: 29 U/L (ref 15–41)
Albumin: 1.6 g/dL — ABNORMAL LOW (ref 3.5–5.0)
Alkaline Phosphatase: 234 U/L — ABNORMAL HIGH (ref 38–126)
Anion gap: 6 (ref 5–15)
BUN: 10 mg/dL (ref 8–23)
CO2: 25 mmol/L (ref 22–32)
Calcium: 7.9 mg/dL — ABNORMAL LOW (ref 8.9–10.3)
Chloride: 105 mmol/L (ref 98–111)
Creatinine, Ser: 0.5 mg/dL (ref 0.44–1.00)
GFR, Estimated: >60 mL/min (ref >60)
Glucose, Bld: 93 mg/dL (ref 70–99)
Potassium: 4.1 mmol/L (ref 3.5–5.1)
Sodium: 136 mmol/L (ref 135–145)
Total Bilirubin: 0.3 mg/dL (ref 0.3–1.2)
Total Protein: 5.8 g/dL — ABNORMAL LOW (ref 6.5–8.1)

## 2022-04-09 LAB — PHOSPHORUS: Phosphorus: 3.9 mg/dL (ref 2.5–4.6)

## 2022-04-09 LAB — FOLATE: Folate: 34 ng/mL (ref >5.9)

## 2022-04-09 LAB — IRON AND TIBC
Iron: 34 ug/dL (ref 28–170)
Saturation Ratios: UNDETERMINED % (ref 10.4–31.8)
TIBC: UNDETERMINED ug/dL (ref 250–450)
UIBC: UNDETERMINED ug/dL

## 2022-04-09 LAB — VITAMIN B12: Vitamin B-12: 1277 pg/mL — ABNORMAL HIGH (ref 180–914)

## 2022-04-09 LAB — SAMPLE TO BLOOD BANK

## 2022-04-09 LAB — PREPARE RBC (CROSSMATCH)

## 2022-04-09 LAB — FERRITIN: Ferritin: 678 ng/mL — ABNORMAL HIGH (ref 11–307)

## 2022-04-09 MED ORDER — HEPARIN SOD (PORK) LOCK FLUSH 100 UNIT/ML IV SOLN
500.0000 [IU] | Freq: Once | INTRAVENOUS | Status: AC
  Administered 2022-04-09: 500 [IU] via INTRAVENOUS
  Filled 2022-04-09: qty 5

## 2022-04-09 MED ORDER — SODIUM CHLORIDE 0.9% FLUSH
10.0000 mL | Freq: Once | INTRAVENOUS | Status: AC
  Administered 2022-04-09: 10 mL via INTRAVENOUS
  Filled 2022-04-09: qty 10

## 2022-04-09 MED ORDER — SODIUM CHLORIDE 0.9 % IV SOLN
Freq: Once | INTRAVENOUS | Status: AC
  Filled 2022-04-09: qty 250

## 2022-04-09 NOTE — Progress Notes (Signed)
Received IV fluids today. Blue blood bracelet is on the patient. She knows not to take it off for her blood transfusion tomorrow. Pt preferred to have port needle removed vs keeping it in overnight for blood transfusion tomm. Discharged to home. ?

## 2022-04-09 NOTE — Progress Notes (Signed)
? ?[facility] ?[place] [facility] ?Telephone:(336) [phone] Fax:(336) [phone] ? ?Patient Care Team: ?Pcp, No as PCP - General ?Clent Jacks, RN as Oncology Nurse Navigator ?Sindy Guadeloupe, MD as Consulting Physician (Oncology) ?[name], MD as Consulting Physician (Gastroenterology) ?Jules Husbands, MD as Consulting Physician (General Surgery)  ? ?Name of the patient: Alicia Coleman  ?[id number]  ?Jul 04, 1956  ? ?Date of visit: 04/09/22 ? ?Reason for Consult: ?Alicia Coleman is a 66 y.o. female with multiple medical problems including metastatic colon cancer with liver metastasis and is status post diverting colostomy on 05/01/2021.  She has had disease progression on both FOLFOX and FOLFIRI chemotherapy.  ? ?Patient saw Dr. Janese Banks on 04/01/2022 at which time options for best supportive care/hospice versus third line chemotherapy with Lonsurf was discussed.  Patient opted to try Lonsurf. ? ?Patient presents to [facility] today for labs, follow-up/supportive care.  Today, patient denies significant changes or concerns.  She has had bilateral lower extremity swelling without pain/redness.  No nausea or vomiting.  Patient thinks that she is eating a little bit better.  She is trying to drink oral nutritional supplements.  She had constipation but this resolved with use of MiraLAX.  She endorses fatigue. ? ?Denies any neurologic complaints. Denies recent fevers or illnesses. Denies any easy bleeding or bruising. Reports good appetite and denies weight loss. Denies chest pain. Denies any nausea, vomiting, constipation, or diarrhea. Denies urinary complaints. Patient offers no further specific complaints today. ? ? ? ?PAST MEDICAL HISTORY: ?Past Medical History:  ?Diagnosis Date  ? Allergy   ? Anemia   ? Cancer [facility])   ? Colon cancer ([place])   ? GI bleed   ? ? ?PAST SURGICAL HISTORY:  ?Past Surgical History:  ?Procedure Laterality Date  ? COLONOSCOPY WITH PROPOFOL N/A 01/16/2022  ? Procedure:  COLONOSCOPY WITH PROPOFOL;  Surgeon: [name], MD;  Location: [facility] ENDOSCOPY;  Service: Gastroenterology;  Laterality: N/A;  ? ESOPHAGOGASTRODUODENOSCOPY  01/16/2022  ? Procedure: ESOPHAGOGASTRODUODENOSCOPY (EGD);  Surgeon: [name], MD;  Location: [facility] ENDOSCOPY;  Service: Gastroenterology;;  ? [place] N/A 04/24/2021  ? Procedure: FLEXIBLE SIGMOIDOSCOPY;  Surgeon: [name], MD;  Location: [facility] ENDOSCOPY;  Service: Gastroenterology;  Laterality: N/A;  ? IR CV LINE INJECTION  11/13/2021  ? IR IMAGING GUIDED PORT INSERTION  11/19/2021  ? IR REMOVAL TUN ACCESS W/ PORT W/O FL MOD SED  11/19/2021  ? PORTACATH PLACEMENT N/A 05/01/2021  ? Procedure: INSERTION PORT-A-CATH;  Surgeon: Jules Husbands, MD;  Location: [facility] ORS;  Service: General;  Laterality: N/A;  ? ? ?HEMATOLOGY/ONCOLOGY HISTORY:  ?Oncology History  ?Primary malignant neoplasm of colon with metastasis in female [facility])  ?04/25/2021 Initial Diagnosis  ? Metastatic colon cancer in female [facility]) ? ?  ?04/25/2021 Cancer Staging  ? Staging form: Colon and Rectum, AJCC 8th Edition ?- Clinical stage from 04/25/2021: Stage IVC (cT4b, cN2, cM1c) - Signed by Sindy Guadeloupe, MD on 04/25/2021 ?Stage prefix: Initial diagnosis ? ?  ?05/19/2021 -  Chemotherapy  ? Patient is on Treatment Plan : COLORECTAL FOLFOX + Bevacizumab q14d  ? ?   ? ? ?ALLERGIES:  is allergic to oxaliplatin. ? ?MEDICATIONS:  ?Current Outpatient Medications  ?Medication Sig Dispense Refill  ? acetaminophen (TYLENOL) 325 MG tablet Take 650 mg by mouth every 6 (six) hours as needed.    ? feeding supplement (ENSURE ENLIVE / ENSURE PLUS) LIQD Take 237 mLs by mouth 3 (three) times daily between meals. (Patient not  taking: Reported on 04/01/2022) 14220 mL 0  ? lidocaine-prilocaine (EMLA) cream Apply 1 application. topically as needed. Apply to port and cover with saran wrap 1-2 hours prior to port access 30 g 4  ? Multiple Vitamin (MULTIVITAMIN WITH MINERALS) TABS tablet Take 1 tablet by mouth  daily. 30 tablet 0  ? phosphorus (K PHOS NEUTRAL) 155-852-130 MG tablet Take 2 tablets (500 mg total) by mouth 3 (three) times daily. 90 tablet 1  ? potassium chloride SA (KLOR-CON M) 20 MEQ tablet Take 20 mEq by mouth daily. (Patient not taking: Reported on 04/01/2022)    ? predniSONE (DELTASONE) 10 MG tablet Take 1 tablet (10 mg total) by mouth daily with breakfast. (Patient not taking: Reported on 04/01/2022) 14 tablet 0  ? Simethicone (GAS-X MAXIMUM STRENGTH PO) Take by mouth.    ? trifluridine-tipiracil (LONSURF) 20-8.19 MG tablet Take 3 tablets (60 mg of trifluridine total) by mouth 2 (two) times daily after a meal. Take within 1 hr of AM & PM meals on days 1-5. Repeat every 14 days 60 tablet 2  ? ?No current facility-administered medications for this visit.  ? ?Facility-Administered Medications Ordered in Other Visits  ?Medication Dose Route Frequency Provider Last Rate Last Admin  ? 0.9 %  sodium chloride infusion   Intravenous Once Sindy Guadeloupe, MD      ? dexamethasone (DECADRON) 10 mg in sodium chloride 0.9 % 50 mL IVPB  10 mg Intravenous Once Sindy Guadeloupe, MD      ? heparin lock flush 100 unit/mL  500 Units Intravenous Once Samara Stankowski, Kirt Boys, NP      ? sodium chloride flush (NS) 0.9 % injection 10 mL  10 mL Intravenous Once Eeva Schlosser, Kirt Boys, NP      ? ? ?VITAL SIGNS: ?LMP  (LMP Unknown)  ?There were no vitals filed for this visit.  ?Estimated body mass index is 18.46 kg/m? as calculated from the following: ?  Height as of 03/03/22: '5\' 6"'$  (1.676 m). ?  Weight as of 04/01/22: 114 lb 6.4 oz (51.9 kg). ? ?LABS: ?CBC: ?   ?Component Value Date/Time  ? WBC 17.5 (H) 04/01/2022 0910  ? HGB 6.7 (L) 04/01/2022 0910  ? HCT 21.8 (L) 04/01/2022 0910  ? PLT 299 04/01/2022 0910  ? MCV 90.5 04/01/2022 0910  ? NEUTROABS 15.8 (H) 04/01/2022 0910  ? LYMPHSABS 0.6 (L) 04/01/2022 0910  ? MONOABS 0.8 04/01/2022 0910  ? EOSABS 0.1 04/01/2022 0910  ? BASOSABS 0.0 04/01/2022 0910  ? ?Comprehensive Metabolic Panel: ?   ?Component  Value Date/Time  ? NA 135 04/01/2022 0910  ? K 2.6 (LL) 04/01/2022 0910  ? CL 100 04/01/2022 0910  ? CO2 28 04/01/2022 0910  ? BUN 9 04/01/2022 0910  ? CREATININE 0.42 (L) 04/01/2022 0910  ? GLUCOSE 81 04/01/2022 0910  ? CALCIUM 7.8 (L) 04/01/2022 0910  ? AST 29 04/01/2022 0910  ? ALT 14 04/01/2022 0910  ? ALKPHOS 184 (H) 04/01/2022 0910  ? BILITOT 0.5 04/01/2022 0910  ? PROT 5.8 (L) 04/01/2022 0910  ? ALBUMIN 1.6 (L) 04/01/2022 0910  ? ? ?RADIOGRAPHIC STUDIES: ?CT CHEST ABDOMEN PELVIS W CONTRAST ? ?Result Date: 03/27/2022 ?CLINICAL DATA:  Restaging metastatic colon cancer. * Tracking Code: BO * EXAM: CT CHEST, ABDOMEN, AND PELVIS WITH CONTRAST TECHNIQUE: Multidetector CT imaging of the chest, abdomen and pelvis was performed following the standard protocol during bolus administration of intravenous contrast. RADIATION DOSE REDUCTION: This exam was performed according to the departmental dose-optimization  program which includes automated exposure control, adjustment of the mA and/or kV according to patient size and/or use of iterative reconstruction technique. CONTRAST:  12m OMNIPAQUE IOHEXOL 300 MG/ML  SOLN COMPARISON:  Multiple priors including most recent CTs January 14, 2022 and December 15, 2021 FINDINGS: CT CHEST FINDINGS Cardiovascular: Accessed left chest Port-A-Cath with tip at the superior cavoatrial junction. Normal caliber thoracic aorta. No central pulmonary embolus on this nondedicated study. Normal size heart. No significant pericardial effusion/thickening. Mediastinum/Nodes: No supraclavicular adenopathy. No discrete thyroid nodule. No pathologically enlarged mediastinal, hilar or axillary lymph nodes. Esophagus is grossly unremarkable. Lungs/Pleura: No suspicious pulmonary nodules or masses. No pleural effusion. No pneumothorax. Musculoskeletal: No suspicious chest wall lesion. No aggressive lytic or blastic lesion of bone. CT ABDOMEN PELVIS FINDINGS Hepatobiliary: Increased size of the segment VI  hepatic metastatic lesion which now measures 7.5 x 7.4 cm on image 67/2 previously 5.8 x 5.4 cm. No new suspicious hepatic lesion identified. No evidence of acute cholecystitis. No biliary ductal dilation. Pa

## 2022-04-09 NOTE — Progress Notes (Signed)
Nutrition Follow-up: ? ?Patient with metastatic colon cancer with liver metastases.  S/p diverting colostomy on 05/01/21.  Patient on lonsurf. ? ?Met with patient during infusion.  Patient reports that her appetite is good.  Feeling tired.  Planning to get blood tomorrow.  Says that her sister is preparing her meals.  Drinking ensure plus during visit and says that it is good.   ? ? ? ?Medications: reviewed ? ?Labs: Phosphorus 3.9 ? ?Anthropometrics:  ? ?Weight 120 lb (bilateral edema) ? ?114 lb 6.4 oz (edema) ?102 lb 14.4 on 4/18 ? ? ? ?NUTRITION DIAGNOSIS: Inadequate oral intake ongoing ? ? ?INTERVENTION:  ?Provided patient with complimentary case of ensure plus ?Discussed with patient importance of eating foods high in protein.  Reviewed foods high in protein ?  ? ?MONITORING, EVALUATION, GOAL: weight trends, intake ? ? ?NEXT VISIT: Friday, May 26 during infusion ? ?Hadja Harral B. Zenia Resides, RD, LDN ?Registered Dietitian ?[phone] ? ? ?

## 2022-04-09 NOTE — Addendum Note (Signed)
Addended by: Luella Cook on: 04/09/2022 12:09 PM ? ? Modules accepted: Orders ? ?

## 2022-04-09 NOTE — Progress Notes (Signed)
Pt is fatigued, and she still has swelling of feet and ankles and nothing changes about the swelling. ?

## 2022-04-10 ENCOUNTER — Telehealth: Payer: Self-pay | Admitting: *Deleted

## 2022-04-10 ENCOUNTER — Inpatient Hospital Stay: Payer: BC Managed Care – PPO

## 2022-04-10 DIAGNOSIS — C189 Malignant neoplasm of colon, unspecified: Secondary | ICD-10-CM | POA: Diagnosis not present

## 2022-04-10 MED ORDER — SODIUM CHLORIDE 0.9% IV SOLUTION
250.0000 mL | Freq: Once | INTRAVENOUS | Status: AC
  Administered 2022-04-10: 250 mL via INTRAVENOUS
  Filled 2022-04-10: qty 250

## 2022-04-10 MED ORDER — DIPHENHYDRAMINE HCL 25 MG PO CAPS
25.0000 mg | ORAL_CAPSULE | Freq: Once | ORAL | Status: AC
  Administered 2022-04-10: 25 mg via ORAL
  Filled 2022-04-10: qty 1

## 2022-04-10 MED ORDER — HEPARIN SOD (PORK) LOCK FLUSH 100 UNIT/ML IV SOLN
250.0000 [IU] | INTRAVENOUS | Status: AC | PRN
  Administered 2022-04-10: 250 [IU]
  Filled 2022-04-10: qty 5

## 2022-04-10 MED ORDER — SODIUM CHLORIDE 0.9% FLUSH
10.0000 mL | INTRAVENOUS | Status: AC | PRN
  Administered 2022-04-10: 10 mL
  Filled 2022-04-10: qty 10

## 2022-04-10 MED ORDER — ACETAMINOPHEN 325 MG PO TABS
650.0000 mg | ORAL_TABLET | Freq: Once | ORAL | Status: AC
  Administered 2022-04-10: 650 mg via ORAL
  Filled 2022-04-10: qty 2

## 2022-04-10 NOTE — Patient Instructions (Signed)
Blood Transfusion, Adult, Care After This sheet gives you information about how to care for yourself after your procedure. Your doctor may also give you more specific instructions. If you have problems or questions, contact your doctor. What can I expect after the procedure? After the procedure, it is common to have: Bruising and soreness at the IV site. A headache. Follow these instructions at home: Insertion site care     Follow instructions from your doctor about how to take care of your insertion site. This is where an IV tube was put into your vein. Make sure you: Wash your hands with soap and water before and after you change your bandage (dressing). If you cannot use soap and water, use hand sanitizer. Change your bandage as told by your doctor. Check your insertion site every day for signs of infection. Check for: Redness, swelling, or pain. Bleeding from the site. Warmth. Pus or a bad smell. General instructions Take over-the-counter and prescription medicines only as told by your doctor. Rest as told by your doctor. Go back to your normal activities as told by your doctor. Keep all follow-up visits as told by your doctor. This is important. Contact a doctor if: You have itching or red, swollen areas of skin (hives). You feel worried or nervous (anxious). You feel weak after doing your normal activities. You have redness, swelling, warmth, or pain around the insertion site. You have blood coming from the insertion site, and the blood does not stop with pressure. You have pus or a bad smell coming from the insertion site. Get help right away if: You have signs of a serious reaction. This may be coming from an allergy or the body's defense system (immune system). Signs include: Trouble breathing or shortness of breath. Swelling of the face or feeling warm (flushed). Fever or chills. Head, chest, or back pain. Dark pee (urine) or blood in the pee. Widespread rash. Fast  heartbeat. Feeling dizzy or light-headed. You may receive your blood transfusion in an outpatient setting. If so, you will be told whom to contact to report any reactions. These symptoms may be an emergency. Do not wait to see if the symptoms will go away. Get medical help right away. Call your local emergency services (911 in the [place] Do not drive yourself to the hospital. Summary Bruising and soreness at the IV site are common. Check your insertion site every day for signs of infection. Rest as told by your doctor. Go back to your normal activities as told by your doctor. Get help right away if you have signs of a serious reaction. This information is not intended to replace advice given to you by your health care provider. Make sure you discuss any questions you have with your health care provider. Document Revised: 03/13/2021 Document Reviewed: 05/11/2019 Elsevier Patient Education  2023 Elsevier Inc.  

## 2022-04-10 NOTE — Telephone Encounter (Signed)
Sister Karin Lieu for the pt dropped off fmla papers on tues of this week and told me on 5/11 that it had to be turned in on 5/14. Faxed the form 5/12 transmission of fax went through to [phone]. ?

## 2022-04-10 NOTE — Progress Notes (Signed)
Patient tolerated 1 unit RBC infusion well today, no  questions/concerns voiced. Patient stable at discharge. AVS given.    ?

## 2022-04-12 LAB — TYPE AND SCREEN
ABO/RH(D): O POS
Antibody Screen: NEGATIVE
Unit division: 0

## 2022-04-12 LAB — BPAM RBC
Blood Product Expiration Date: 202306022359
ISSUE DATE / TIME: 202305121031
Unit Type and Rh: 5100

## 2022-04-13 ENCOUNTER — Telehealth: Payer: Self-pay | Admitting: Nurse Practitioner

## 2022-04-13 ENCOUNTER — Other Ambulatory Visit: Payer: Self-pay | Admitting: Pharmacist

## 2022-04-13 DIAGNOSIS — C189 Malignant neoplasm of colon, unspecified: Secondary | ICD-10-CM

## 2022-04-13 MED ORDER — LONSURF 20-8.19 MG PO TABS: 35.0000 mg/m2 | ORAL_TABLET | Freq: Two times a day (BID) | ORAL | 1 refills | Status: DC

## 2022-04-13 NOTE — Telephone Encounter (Signed)
Attempted to contact patient to reschedule the Palliative Consult (due to a No Show on 4/20), no answer and unable to leave a message due to mailbox was full.  I then called patient's sister Mateo Flow with no answer and left a message requesting a return call to reschedule the visit.  ?

## 2022-04-13 NOTE — Progress Notes (Signed)
Patient was allowed one fill at [facility]. Remaining refills were redirected to [facility]. Patient is aware that her next refill will come from [facility]. ?

## 2022-04-15 ENCOUNTER — Inpatient Hospital Stay (HOSPITAL_BASED_OUTPATIENT_CLINIC_OR_DEPARTMENT_OTHER): Payer: BC Managed Care – PPO | Admitting: Hospice and Palliative Medicine

## 2022-04-15 DIAGNOSIS — Z515 Encounter for palliative care: Secondary | ICD-10-CM

## 2022-04-15 NOTE — Progress Notes (Signed)
I tried calling patient for scheduled telephone visit.  I did not reach patient was unable to leave voicemail.  Will reschedule. ?

## 2022-04-17 ENCOUNTER — Inpatient Hospital Stay: Payer: BC Managed Care – PPO

## 2022-04-17 VITALS — BP 115/80 | HR 91 | Temp 97.4°F | Resp 18

## 2022-04-17 DIAGNOSIS — E876 Hypokalemia: Secondary | ICD-10-CM

## 2022-04-17 DIAGNOSIS — C189 Malignant neoplasm of colon, unspecified: Secondary | ICD-10-CM | POA: Diagnosis not present

## 2022-04-17 DIAGNOSIS — E860 Dehydration: Secondary | ICD-10-CM

## 2022-04-17 LAB — COMPREHENSIVE METABOLIC PANEL
ALT: 12 U/L (ref 0–44)
AST: 21 U/L (ref 15–41)
Albumin: 1.5 g/dL — ABNORMAL LOW (ref 3.5–5.0)
Alkaline Phosphatase: 191 U/L — ABNORMAL HIGH (ref 38–126)
Anion gap: 7 (ref 5–15)
BUN: 9 mg/dL (ref 8–23)
CO2: 26 mmol/L (ref 22–32)
Calcium: 8.1 mg/dL — ABNORMAL LOW (ref 8.9–10.3)
Chloride: 103 mmol/L (ref 98–111)
Creatinine, Ser: 0.43 mg/dL — ABNORMAL LOW (ref 0.44–1.00)
GFR, Estimated: >60 mL/min (ref >60)
Glucose, Bld: 89 mg/dL (ref 70–99)
Potassium: 3.2 mmol/L — ABNORMAL LOW (ref 3.5–5.1)
Sodium: 136 mmol/L (ref 135–145)
Total Bilirubin: 0.8 mg/dL (ref 0.3–1.2)
Total Protein: 5.8 g/dL — ABNORMAL LOW (ref 6.5–8.1)

## 2022-04-17 LAB — CBC WITH DIFFERENTIAL/PLATELET
Abs Immature Granulocytes: 0.18 10*3/uL — ABNORMAL HIGH (ref 0.00–0.07)
Basophils Absolute: 0 10*3/uL (ref 0.0–0.1)
Basophils Relative: 0 %
Eosinophils Absolute: 0 10*3/uL (ref 0.0–0.5)
Eosinophils Relative: 0 %
HCT: 23.8 % — ABNORMAL LOW (ref 36.0–46.0)
Hemoglobin: 7.6 g/dL — ABNORMAL LOW (ref 12.0–15.0)
Immature Granulocytes: 2 %
Lymphocytes Relative: 4 %
Lymphs Abs: 0.4 10*3/uL — ABNORMAL LOW (ref 0.7–4.0)
MCH: 28.4 pg (ref 26.0–34.0)
MCHC: 31.9 g/dL (ref 30.0–36.0)
MCV: 88.8 fL (ref 80.0–100.0)
Monocytes Absolute: 0.2 10*3/uL (ref 0.1–1.0)
Monocytes Relative: 2 %
Neutro Abs: 8.5 10*3/uL — ABNORMAL HIGH (ref 1.7–7.7)
Neutrophils Relative %: 92 %
Platelets: 103 10*3/uL — ABNORMAL LOW (ref 150–400)
RBC: 2.68 MIL/uL — ABNORMAL LOW (ref 3.87–5.11)
RDW: 15 % (ref 11.5–15.5)
WBC: 9.3 10*3/uL (ref 4.0–10.5)
nRBC: 0 % (ref 0.0–0.2)

## 2022-04-17 MED ORDER — POTASSIUM CHLORIDE 20 MEQ/100ML IV SOLN
20.0000 meq | Freq: Once | INTRAVENOUS | Status: AC
  Administered 2022-04-17: 20 meq via INTRAVENOUS

## 2022-04-17 MED ORDER — HEPARIN SOD (PORK) LOCK FLUSH 100 UNIT/ML IV SOLN
500.0000 [IU] | Freq: Once | INTRAVENOUS | Status: AC
  Administered 2022-04-17: 500 [IU] via INTRAVENOUS
  Filled 2022-04-17: qty 5

## 2022-04-17 MED ORDER — SODIUM CHLORIDE 0.9% FLUSH
10.0000 mL | Freq: Once | INTRAVENOUS | Status: AC
  Administered 2022-04-17: 10 mL via INTRAVENOUS
  Filled 2022-04-17: qty 10

## 2022-04-17 MED ORDER — SODIUM CHLORIDE 0.9 % IV SOLN
Freq: Once | INTRAVENOUS | Status: AC
  Filled 2022-04-17: qty 250

## 2022-04-17 NOTE — Patient Instructions (Signed)
Potassium Chloride Injection What is this medication? POTASSIUM CHLORIDE (poe TASS i um KLOOR ide) prevents and treats low levels of potassium in your body. Potassium plays an important role in maintaining the health of your kidneys, heart, muscles, and nervous system. This medicine may be used for other purposes; ask your health care provider or pharmacist if you have questions. COMMON BRAND NAME(S): PROAMP What should I tell my care team before I take this medication? They need to know if you have any of these conditions: Addison disease Dehydration Diabetes (high blood sugar) Heart disease High levels of potassium in the blood Irregular heartbeat or rhythm Kidney disease Large areas of burned skin An unusual or allergic reaction to potassium, other medications, foods, dyes, or preservatives Pregnant or trying to get pregnant Breast-feeding How should I use this medication? This medication is injected into a vein. It is given in a hospital or clinic setting. Talk to your care team about the use of this medication in children. Special care may be needed. Overdosage: If you think you have taken too much of this medicine contact a poison control center or emergency room at once. NOTE: This medicine is only for you. Do not share this medicine with others. What if I miss a dose? This does not apply. This medication is not for regular use. What may interact with this medication? Do not take this medication with any of the following: Certain diuretics such as spironolactone, triamterene Eplerenone Sodium polystyrene sulfonate This medication may also interact with the following: Certain medications for blood pressure or heart disease like lisinopril, losartan, quinapril, valsartan Medications that lower your chance of fighting infection such as cyclosporine, tacrolimus NSAIDs, medications for pain and inflammation, like ibuprofen or naproxen Other potassium supplements Salt  substitutes This list may not describe all possible interactions. Give your health care provider a list of all the medicines, herbs, non-prescription drugs, or dietary supplements you use. Also tell them if you smoke, drink alcohol, or use illegal drugs. Some items may interact with your medicine. What should I watch for while using this medication? Visit your care team for regular checks on your progress. Tell your care team if your symptoms do not start to get better or if they get worse. You may need blood work while you are taking this medication. Avoid salt substitutes unless you are told otherwise by your care team. What side effects may I notice from receiving this medication? Side effects that you should report to your care team as soon as possible: Allergic reactions--skin rash, itching, hives, swelling of the face, lips, tongue, or throat High potassium level--muscle weakness, fast or irregular heartbeat Side effects that usually do not require medical attention (report to your care team if they continue or are bothersome): Diarrhea Nausea Stomach pain Vomiting This list may not describe all possible side effects. Call your doctor for medical advice about side effects. You may report side effects to FDA at 1-800-FDA-1088. Where should I keep my medication? This medication is given in a hospital or clinic. It will not be stored at home. NOTE: This sheet is a summary. It may not cover all possible information. If you have questions about this medicine, talk to your doctor, pharmacist, or health care provider.  2023 Elsevier/Gold Standard (2021-03-04 00:00:00)  

## 2022-04-17 NOTE — Progress Notes (Signed)
Pt in clinic for IV fluids. Received Potassium IV. MD aware of hemoglobin of 7.6. Pt thinks her edema is a little better in LE's.

## 2022-04-24 ENCOUNTER — Inpatient Hospital Stay (HOSPITAL_BASED_OUTPATIENT_CLINIC_OR_DEPARTMENT_OTHER): Payer: BC Managed Care – PPO | Admitting: Oncology

## 2022-04-24 ENCOUNTER — Inpatient Hospital Stay: Payer: BC Managed Care – PPO

## 2022-04-24 ENCOUNTER — Telehealth: Payer: Self-pay | Admitting: Oncology

## 2022-04-24 ENCOUNTER — Other Ambulatory Visit: Payer: Self-pay

## 2022-04-24 ENCOUNTER — Encounter: Payer: Self-pay | Admitting: Oncology

## 2022-04-24 VITALS — BP 121/82 | HR 101 | Resp 16

## 2022-04-24 VITALS — BP 121/82 | HR 101 | Temp 96.8°F | Resp 18

## 2022-04-24 DIAGNOSIS — C189 Malignant neoplasm of colon, unspecified: Secondary | ICD-10-CM | POA: Diagnosis not present

## 2022-04-24 DIAGNOSIS — Z79899 Other long term (current) drug therapy: Secondary | ICD-10-CM

## 2022-04-24 DIAGNOSIS — D702 Other drug-induced agranulocytosis: Secondary | ICD-10-CM | POA: Diagnosis not present

## 2022-04-24 DIAGNOSIS — D649 Anemia, unspecified: Secondary | ICD-10-CM

## 2022-04-24 DIAGNOSIS — D6489 Other specified anemias: Secondary | ICD-10-CM

## 2022-04-24 DIAGNOSIS — E876 Hypokalemia: Secondary | ICD-10-CM

## 2022-04-24 LAB — COMPREHENSIVE METABOLIC PANEL
ALT: 12 U/L (ref 0–44)
AST: 20 U/L (ref 15–41)
Albumin: 1.5 g/dL — ABNORMAL LOW (ref 3.5–5.0)
Alkaline Phosphatase: 171 U/L — ABNORMAL HIGH (ref 38–126)
Anion gap: 6 (ref 5–15)
BUN: 7 mg/dL — ABNORMAL LOW (ref 8–23)
CO2: 27 mmol/L (ref 22–32)
Calcium: 7.7 mg/dL — ABNORMAL LOW (ref 8.9–10.3)
Chloride: 101 mmol/L (ref 98–111)
Creatinine, Ser: 0.47 mg/dL (ref 0.44–1.00)
GFR, Estimated: >60 mL/min (ref >60)
Glucose, Bld: 94 mg/dL (ref 70–99)
Potassium: 3 mmol/L — ABNORMAL LOW (ref 3.5–5.1)
Sodium: 134 mmol/L — ABNORMAL LOW (ref 135–145)
Total Bilirubin: 0.6 mg/dL (ref 0.3–1.2)
Total Protein: 5.9 g/dL — ABNORMAL LOW (ref 6.5–8.1)

## 2022-04-24 LAB — CBC WITH DIFFERENTIAL/PLATELET
Abs Immature Granulocytes: 0.13 10*3/uL — ABNORMAL HIGH (ref 0.00–0.07)
Basophils Absolute: 0 10*3/uL (ref 0.0–0.1)
Basophils Relative: 1 %
Eosinophils Absolute: 0 10*3/uL (ref 0.0–0.5)
Eosinophils Relative: 1 %
HCT: 21.6 % — ABNORMAL LOW (ref 36.0–46.0)
Hemoglobin: 6.7 g/dL — ABNORMAL LOW (ref 12.0–15.0)
Immature Granulocytes: 9 %
Lymphocytes Relative: 17 %
Lymphs Abs: 0.3 10*3/uL — ABNORMAL LOW (ref 0.7–4.0)
MCH: 27.6 pg (ref 26.0–34.0)
MCHC: 31 g/dL (ref 30.0–36.0)
MCV: 88.9 fL (ref 80.0–100.0)
Monocytes Absolute: 0.1 10*3/uL (ref 0.1–1.0)
Monocytes Relative: 9 %
Neutro Abs: 1 10*3/uL — ABNORMAL LOW (ref 1.7–7.7)
Neutrophils Relative %: 63 %
Platelets: 304 10*3/uL (ref 150–400)
RBC: 2.43 MIL/uL — ABNORMAL LOW (ref 3.87–5.11)
RDW: 15 % (ref 11.5–15.5)
Smear Review: NORMAL
WBC: 1.5 10*3/uL — ABNORMAL LOW (ref 4.0–10.5)
nRBC: 0 % (ref 0.0–0.2)

## 2022-04-24 MED ORDER — POTASSIUM CHLORIDE IN NACL 20-0.9 MEQ/L-% IV SOLN
INTRAVENOUS | Status: DC
  Filled 2022-04-24 (×2): qty 1000

## 2022-04-24 MED ORDER — HEPARIN SOD (PORK) LOCK FLUSH 100 UNIT/ML IV SOLN
500.0000 [IU] | Freq: Once | INTRAVENOUS | Status: AC
  Filled 2022-04-24: qty 5

## 2022-04-24 MED ORDER — SODIUM CHLORIDE 0.9% FLUSH
10.0000 mL | Freq: Once | INTRAVENOUS | Status: AC
  Administered 2022-04-24: 10 mL via INTRAVENOUS
  Filled 2022-04-24: qty 10

## 2022-04-24 MED ORDER — POTASSIUM CHLORIDE 20 MEQ/100ML IV SOLN: 20.0000 meq | Freq: Once | INTRAVENOUS | Status: DC

## 2022-04-24 MED ORDER — HEPARIN SOD (PORK) LOCK FLUSH 100 UNIT/ML IV SOLN
INTRAVENOUS | Status: AC
  Administered 2022-04-24: 500 [IU] via INTRAVENOUS
  Filled 2022-04-24: qty 5

## 2022-04-24 MED ORDER — SODIUM CHLORIDE 0.9 % IV SOLN
Freq: Once | INTRAVENOUS | Status: AC
  Filled 2022-04-24: qty 250

## 2022-04-24 NOTE — Patient Instructions (Signed)
Potassium Chloride Injection What is this medication? POTASSIUM CHLORIDE (poe TASS i um KLOOR ide) prevents and treats low levels of potassium in your body. Potassium plays an important role in maintaining the health of your kidneys, heart, muscles, and nervous system. This medicine may be used for other purposes; ask your health care provider or pharmacist if you have questions. COMMON BRAND NAME(S): PROAMP What should I tell my care team before I take this medication? They need to know if you have any of these conditions: Addison disease Dehydration Diabetes (high blood sugar) Heart disease High levels of potassium in the blood Irregular heartbeat or rhythm Kidney disease Large areas of burned skin An unusual or allergic reaction to potassium, other medications, foods, dyes, or preservatives Pregnant or trying to get pregnant Breast-feeding How should I use this medication? This medication is injected into a vein. It is given in a hospital or clinic setting. Talk to your care team about the use of this medication in children. Special care may be needed. Overdosage: If you think you have taken too much of this medicine contact a poison control center or emergency room at once. NOTE: This medicine is only for you. Do not share this medicine with others. What if I miss a dose? This does not apply. This medication is not for regular use. What may interact with this medication? Do not take this medication with any of the following: Certain diuretics such as spironolactone, triamterene Eplerenone Sodium polystyrene sulfonate This medication may also interact with the following: Certain medications for blood pressure or heart disease like lisinopril, losartan, quinapril, valsartan Medications that lower your chance of fighting infection such as cyclosporine, tacrolimus NSAIDs, medications for pain and inflammation, like ibuprofen or naproxen Other potassium supplements Salt  substitutes This list may not describe all possible interactions. Give your health care provider a list of all the medicines, herbs, non-prescription drugs, or dietary supplements you use. Also tell them if you smoke, drink alcohol, or use illegal drugs. Some items may interact with your medicine. What should I watch for while using this medication? Visit your care team for regular checks on your progress. Tell your care team if your symptoms do not start to get better or if they get worse. You may need blood work while you are taking this medication. Avoid salt substitutes unless you are told otherwise by your care team. What side effects may I notice from receiving this medication? Side effects that you should report to your care team as soon as possible: Allergic reactions--skin rash, itching, hives, swelling of the face, lips, tongue, or throat High potassium level--muscle weakness, fast or irregular heartbeat Side effects that usually do not require medical attention (report to your care team if they continue or are bothersome): Diarrhea Nausea Stomach pain Vomiting This list may not describe all possible side effects. Call your doctor for medical advice about side effects. You may report side effects to FDA at 1-800-FDA-1088. Where should I keep my medication? This medication is given in a hospital or clinic. It will not be stored at home. NOTE: This sheet is a summary. It may not cover all possible information. If you have questions about this medicine, talk to your doctor, pharmacist, or health care provider.  2023 Elsevier/Gold Standard (2021-03-04 00:00:00)  

## 2022-04-24 NOTE — Progress Notes (Signed)
Hematology/Oncology Consult note [facility]  Telephone:([phone] [phone]  Patient Care Team: Pcp, No as PCP - General Clent Jacks, RN as Oncology Nurse Navigator Sindy Guadeloupe, MD as Consulting Physician (Oncology) [name], MD as Consulting Physician (Gastroenterology) Jules Husbands, MD as Consulting Physician (General Surgery)   Name of the patient: Alicia Coleman  [id number]  [birth year]   Date of visit: 04/24/22  Diagnosis- metastatic colon cancer with liver metastases  Chief complaint/ Reason for visit-routine follow-up of colon cancer presently on Lonsurf  Heme/Onc history: Patient is a 66 year old female who was referred from the ER for severe iron deficiency anemia.  She received IV Venofer for that.  I was concerned about malignancy given the degree of anemia and therefore obtain CT abdomen and pelvis with contrast CT showed 6.4 cm enhancing mass in the right liver and another mass measuring 4 cm.  Large volume stool in the right colon transverse and descending colon with irregular mass lesion in the distal sigmoid colon extending into the rectosigmoid junction with lateral extracolonic extension into the pelvic sidewall.  Apparent luminal narrowing at the level of the lesion which accounts for large volume of stool proximally.  Multiple pericolonic and perirectal lymph nodes.  Multiple peritoneal nodules are seen in the lower right pelvic sidewall.  Colon mass appears to be invading posterior uterus.Thrombosis of the superior rectal vein may be bland thrombus although direct tumor invasion not excluded.   Patient had a flexible sigmoidoscopy which showed a large obstructing mass in the rectosigmoid colon.  Scope could not be traversed beyond the obstruction.  Biopsies were taken and were consistent with adenocarcinoma. Patient had a diverting colostomy on 05/01/2021 with Dr. Dahlia Byes   Patient started palliative FOLFOX chemotherapy in  June 2022 with plans to add Avastin after 6 cycles of treatment.  Patient developed allergic reaction to oxaliplatin with cycle 10 as evidenced by diaphoresis vomiting and flushing.  Plan is to hold off on further doses of oxaliplatin.  She has K-ras mutation positive   Progression noted on scans in January 2023.  Plan to switch to FOLFIRI bevacizumab.  Bevacizumab was stopped after there was concern for bleeding from the primary colorectal mass.  Chemotherapy on hold since 02/17/2022 due to poor performance status  Disease progression in May 2023 and patient switched to [facility].  Interval history-patient reports that she predominantly spends her time resting but is able to walk around the house.  She is independent of her ADLs.  She is trying to eat better.  Still has bilateral lower extremity swelling.  ECOG PS- 2 Pain scale- 0 Opioid associated constipation- no  Review of systems- Review of Systems  Constitutional:  Positive for malaise/fatigue. Negative for chills, fever and weight loss.  HENT:  Negative for congestion, ear discharge and nosebleeds.   Eyes:  Negative for blurred vision.  Respiratory:  Negative for cough, hemoptysis, sputum production, shortness of breath and wheezing.   Cardiovascular:  Positive for leg swelling. Negative for chest pain, palpitations, orthopnea and claudication.  Gastrointestinal:  Negative for abdominal pain, blood in stool, constipation, diarrhea, heartburn, melena, nausea and vomiting.  Genitourinary:  Negative for dysuria, flank pain, frequency, hematuria and urgency.  Musculoskeletal:  Negative for back pain, joint pain and myalgias.  Skin:  Negative for rash.  Neurological:  Negative for dizziness, tingling, focal weakness, seizures, weakness and headaches.  Endo/Heme/Allergies:  Does not bruise/bleed easily.  Psychiatric/Behavioral:  Negative for depression and suicidal ideas.  The patient does not have insomnia.       Allergies  Allergen  Reactions   Oxaliplatin Nausea And Vomiting     Past Medical History:  Diagnosis Date   Allergy    Anemia    Cancer ([place])    Colon cancer ([place])    GI bleed      Past Surgical History:  Procedure Laterality Date   COLONOSCOPY WITH PROPOFOL N/A 01/16/2022   Procedure: COLONOSCOPY WITH PROPOFOL;  Surgeon: [name], MD;  Location: [facility] ENDOSCOPY;  Service: Gastroenterology;  Laterality: N/A;   ESOPHAGOGASTRODUODENOSCOPY  01/16/2022   Procedure: ESOPHAGOGASTRODUODENOSCOPY (EGD);  Surgeon: [name], MD;  Location: [facility] ENDOSCOPY;  Service: Gastroenterology;;   Beryle Quant N/A 04/24/2021   Procedure: Beryle Quant;  Surgeon: [name], MD;  Location: [facility] ENDOSCOPY;  Service: Gastroenterology;  Laterality: N/A;   IR CV LINE INJECTION  11/13/2021   IR IMAGING GUIDED PORT INSERTION  11/19/2021   IR REMOVAL TUN ACCESS W/ PORT W/O FL MOD SED  11/19/2021   PORTACATH PLACEMENT N/A 05/01/2021   Procedure: INSERTION PORT-A-CATH;  Surgeon: Jules Husbands, MD;  Location: [facility] ORS;  Service: General;  Laterality: N/A;    Social History   Socioeconomic History   Marital status: Married    Spouse name: Not on file   Number of children: Not on file   Years of education: Not on file   Highest education level: Not on file  Occupational History   Not on file  Tobacco Use   Smoking status: Never   Smokeless tobacco: Never  Vaping Use   Vaping Use: Never used  Substance and Sexual Activity   Alcohol use: Not Currently   Drug use: Never   Sexual activity: Not Currently  Other Topics Concern   Not on file  Social History Narrative   Not on file   Social Determinants of Health   Financial Resource Strain: Not on file  Food Insecurity: Not on file  Transportation Needs: Not on file  Physical Activity: Not on file  Stress: Not on file  Social Connections: Not on file  Intimate Partner Violence: Not on file    Family History  Problem Relation Age of Onset    Stroke Mother    Hypertension Mother    Cancer Father      Current Outpatient Medications:    feeding supplement (ENSURE ENLIVE / ENSURE PLUS) LIQD, Take 237 mLs by mouth 3 (three) times daily between meals., Disp: 14220 mL, Rfl: 0   lidocaine-prilocaine (EMLA) cream, Apply 1 application. topically as needed. Apply to port and cover with saran wrap 1-2 hours prior to port access, Disp: 30 g, Rfl: 4   Multiple Vitamin (MULTIVITAMIN WITH MINERALS) TABS tablet, Take 1 tablet by mouth daily., Disp: 30 tablet, Rfl: 0   Simethicone (GAS-X MAXIMUM STRENGTH PO), Take by mouth., Disp: , Rfl:    trifluridine-tipiracil (LONSURF) 20-8.19 MG tablet, Take 3 tablets (60 mg of trifluridine total) by mouth 2 (two) times daily after a meal. Take within 1 hr of AM & PM meals on days 1-5. Repeat every 14 days, Disp: 60 tablet, Rfl: 1   acetaminophen (TYLENOL) 325 MG tablet, Take 650 mg by mouth every 6 (six) hours as needed. (Patient not taking: Reported on 04/24/2022), Disp: , Rfl:    phosphorus (K PHOS NEUTRAL) 155-852-130 MG tablet, Take 2 tablets (500 mg total) by mouth 3 (three) times daily., Disp: 90 tablet, Rfl: 1   potassium chloride SA (KLOR-CON M) 20  MEQ tablet, Take 20 mEq by mouth daily. (Patient not taking: Reported on 04/01/2022), Disp: , Rfl:    predniSONE (DELTASONE) 10 MG tablet, Take 1 tablet (10 mg total) by mouth daily with breakfast. (Patient not taking: Reported on 04/01/2022), Disp: 14 tablet, Rfl: 0 No current facility-administered medications for this visit.  Facility-Administered Medications Ordered in Other Visits:    0.9 %  sodium chloride infusion, , Intravenous, Once, Randa Evens C, MD   0.9 % NaCl with KCl 20 mEq/ L  infusion, , Intravenous, Continuous, Borders, Kirt Boys, NP, Stopped at 04/24/22 1305   dexamethasone (DECADRON) 10 mg in sodium chloride 0.9 % 50 mL IVPB, 10 mg, Intravenous, Once, Sindy Guadeloupe, MD  Physical exam:  Vitals:   04/24/22 1127  BP: 121/82  Pulse: (!) 101   Resp: 16   Physical Exam Constitutional:      General: She is not in acute distress.    Comments: She is fatigued and cachectic  Cardiovascular:     Rate and Rhythm: Normal rate and regular rhythm.     Heart sounds: Normal heart sounds.  Pulmonary:     Effort: Pulmonary effort is normal.     Breath sounds: Normal breath sounds.  Abdominal:     General: Bowel sounds are normal.     Palpations: Abdomen is soft.     Comments: Colostomy in place  Musculoskeletal:     Right lower leg: Edema present.     Left lower leg: Edema present.  Skin:    General: Skin is warm and dry.  Neurological:     Mental Status: She is alert and oriented to person, place, and time.        Latest Ref Rng & Units 04/24/2022   11:16 AM  CMP  Glucose 70 - 99 mg/dL 94    BUN 8 - 23 mg/dL 7    Creatinine 0.44 - 1.00 mg/dL 0.47    Sodium 135 - 145 mmol/L 134    Potassium 3.5 - 5.1 mmol/L 3.0    Chloride 98 - 111 mmol/L 101    CO2 22 - 32 mmol/L 27    Calcium 8.9 - 10.3 mg/dL 7.7    Total Protein 6.5 - 8.1 g/dL 5.9    Total Bilirubin 0.3 - 1.2 mg/dL 0.6    Alkaline Phos 38 - 126 U/L 171    AST 15 - 41 U/L 20    ALT 0 - 44 U/L 12        Latest Ref Rng & Units 04/24/2022   11:16 AM  CBC  WBC 4.0 - 10.5 K/uL 1.5    Hemoglobin 12.0 - 15.0 g/dL 6.7    Hematocrit 36.0 - 46.0 % 21.6    Platelets 150 - 400 K/uL 304      No images are attached to the encounter.  CT CHEST ABDOMEN PELVIS W CONTRAST  Result Date: 03/27/2022 CLINICAL DATA:  Restaging metastatic colon cancer. * Tracking Code: BO * EXAM: CT CHEST, ABDOMEN, AND PELVIS WITH CONTRAST TECHNIQUE: Multidetector CT imaging of the chest, abdomen and pelvis was performed following the standard protocol during bolus administration of intravenous contrast. RADIATION DOSE REDUCTION: This exam was performed according to the departmental dose-optimization program which includes automated exposure control, adjustment of the mA and/or kV according to patient  size and/or use of iterative reconstruction technique. CONTRAST:  69m OMNIPAQUE IOHEXOL 300 MG/ML  SOLN COMPARISON:  Multiple priors including most recent CTs January 14, 2022 and December 15, 2021 FINDINGS: CT CHEST FINDINGS Cardiovascular: Accessed left chest Port-A-Cath with tip at the superior cavoatrial junction. Normal caliber thoracic aorta. No central pulmonary embolus on this nondedicated study. Normal size heart. No significant pericardial effusion/thickening. Mediastinum/Nodes: No supraclavicular adenopathy. No discrete thyroid nodule. No pathologically enlarged mediastinal, hilar or axillary lymph nodes. Esophagus is grossly unremarkable. Lungs/Pleura: No suspicious pulmonary nodules or masses. No pleural effusion. No pneumothorax. Musculoskeletal: No suspicious chest wall lesion. No aggressive lytic or blastic lesion of bone. CT ABDOMEN PELVIS FINDINGS Hepatobiliary: Increased size of the segment VI hepatic metastatic lesion which now measures 7.5 x 7.4 cm on image 67/2 previously 5.8 x 5.4 cm. No new suspicious hepatic lesion identified. No evidence of acute cholecystitis. No biliary ductal dilation. Pancreas: No pancreatic ductal dilation or evidence of acute inflammation. Spleen: No splenomegaly or focal splenic lesion. Adrenals/Urinary Tract: Bilateral adrenal glands appear normal. New mild left hydroureteronephrosis to the level of the pelvic mass with subtle hypoenhancement of the left kidney no right-sided hydronephrosis. Urinary bladder is unremarkable for degree of distension. Stomach/Bowel: Radiopaque enteric contrast material traverses the hepatic flexure. Stomach is minimally distended limiting evaluation. No pathologic dilation of bowel. Surgical changes of double barrel ostomy in the left lower quadrant, diverting colostomy. Downstream from the diverting colostomy is a large colonic mass with extra colonic extension and central necrotic debris which again communicates with the colonic  lumen. The dominant area of the mass measures approximately 7.1 x 4.1 cm on image 101/2 previously 6.3 x 5.6 cm when remeasured for consistency, while the mass measures larger a significant component of this reflects internal necrotic debris with the soft tissue component of the mass while difficult to measure and quantify subjectively appears similar to prior. Vascular/Lymphatic: Normal caliber abdominal aorta. The portal, splenic and superior mesenteric veins are patent. Mesenteric and left pelvic sidewall adenopathy with the previously indexed left pelvic sidewall lymph node measures 1 cm in short axis on image 104/2 previously 1.2 cm when remeasured. Soft tissue nodule/lymph node in the sigmoid mesenteric root anterior to the left common iliac artery measures 13 mm in short axis, unchanged from recent prior. Reproductive: The large colonic mass abuts the left uterine fundus now with signs of possible invasion into the uterine fundus for instance on axial image 104/2 and sagittal image 76/6. Other: Small volume abdominopelvic ascites. Slight interval progression of the diffuse subcutaneous edema. Musculoskeletal: No aggressive lytic or blastic lesion of bone. Post radiation change in the sacrum. IMPRESSION: 1. New mild left hydroureteronephrosis to the level of the pelvic mass with subtle hypoenhancement of the left kidney, consistent with obstruction from the colonic mass, consider urology referral. 2. Large colonic mass with extra colonic extension and central necrotic debris again communicates with the colonic lumen. The dominant area of the mass measures larger at approximately 7.1 x 4.1 cm. However, a significant component of this reflects internal necrotic debris with the soft tissue component of the mass while difficult to measure, subjectively appears similar to prior. 3. Increased size of the segment VI hepatic metastatic lesion, consistent with worsening metastatic disease. 4. Similar pelvic and  mesenteric adenopathy/nodularity. 5. Increased small volume abdominopelvic ascites and diffuse subcutaneous edema. These results will be called to the ordering clinician or representative by the Radiologist Assistant, and communication documented in the PACS or [employer]. Electronically Signed   By: Dahlia Bailiff M.D.   On: 03/27/2022 08:52     Assessment and plan- Patient is a 66 y.o. female with metastatic colon cancer with  progression on FOLFOX and FOLFIRI chemotherapy presently on third line Lonsurf here for routine follow-up  Patient recently started taking her Lonsurf and today is day 14 of cycle 1.  She takes her last dose today and then she gets a 2-week break.  Patient's white cell count is low at 1.5 today with an ANC of 1 likely secondary to [facility].  Continue to monitor.  Hemoglobin is low at 6.7 likely combination of Lonsurf plus underlying malignancy.  We will plan to transfuse her 1 unit of blood next week.  Return to clinic sometime next week with CBC BMP and see covering NP for possible IV fluids.  She will get CBC and BMP weekly and each week for possible IV fluids.  See Dr. Janese Banks in 3 weeks.  Check CEA in 3 weeks.  I will continue with present dose of Lonsurf for right now but based on her counts in 2 weeks I will decide if I need to lowered the dose before start of next cycle.  Bilateral lower extremity edema likely secondary to hypoalbuminemia.  Continue to monitor   Visit Diagnosis 1. Primary malignant neoplasm of colon with metastasis in female [facility])   2. High risk medication use   3. Drug-induced neutropenia (HCC)   4. Drug-induced anemia      Dr. Randa Evens, MD, MPH [facility] [phone] 04/24/2022 5:34 PM

## 2022-04-24 NOTE — Telephone Encounter (Signed)
Attempt made to reach patient after missing her appointment today. Left VM and requested she call back when available.

## 2022-04-24 NOTE — Progress Notes (Signed)
Nutrition Follow-up:  Patient with metastatic colon cancer with liver metastases.  S/p diverting colostomy on 05/01/21.    Met with patient during IV fluids.  Patient reports that she has been drinking ensure shakes 2-3 times per day.  "I am trying to increase my protein."  "I still having swelling in my legs." Sister is preparing meals    Medications: reviewed  Labs: K 3.0  Anthropometrics:   Weight 120 lb on 5/11 (bilateral leg edema)  114 lb 6.4 oz (edema) 102 lb on 4/18   NUTRITION DIAGNOSIS: Inadequate oral intake ongoing   INTERVENTION:  Continue ensure shakes Discussed foods high in protein.  Patient planning on trying some pimento cheese     MONITORING, EVALUATION, GOAL: weight trends, intake   NEXT VISIT: as needed  Alicia Coleman, [place], [place] Registered Dietitian [phone]

## 2022-04-25 LAB — CEA: CEA: 4214 ng/mL — ABNORMAL HIGH (ref 0.0–4.7)

## 2022-04-28 ENCOUNTER — Inpatient Hospital Stay: Payer: BC Managed Care – PPO

## 2022-04-28 ENCOUNTER — Other Ambulatory Visit: Payer: Self-pay | Admitting: *Deleted

## 2022-04-28 DIAGNOSIS — D649 Anemia, unspecified: Secondary | ICD-10-CM

## 2022-04-28 DIAGNOSIS — C189 Malignant neoplasm of colon, unspecified: Secondary | ICD-10-CM

## 2022-04-28 LAB — CBC WITH DIFFERENTIAL/PLATELET
Abs Immature Granulocytes: 0.1 10*3/uL — ABNORMAL HIGH (ref 0.00–0.07)
Basophils Absolute: 0 10*3/uL (ref 0.0–0.1)
Basophils Relative: 1 %
Eosinophils Absolute: 0 10*3/uL (ref 0.0–0.5)
Eosinophils Relative: 1 %
HCT: 19 % — ABNORMAL LOW (ref 36.0–46.0)
Hemoglobin: 6 g/dL — ABNORMAL LOW (ref 12.0–15.0)
Immature Granulocytes: 5 %
Lymphocytes Relative: 13 %
Lymphs Abs: 0.3 10*3/uL — ABNORMAL LOW (ref 0.7–4.0)
MCH: 28.3 pg (ref 26.0–34.0)
MCHC: 31.6 g/dL (ref 30.0–36.0)
MCV: 89.6 fL (ref 80.0–100.0)
Monocytes Absolute: 0.1 10*3/uL (ref 0.1–1.0)
Monocytes Relative: 4 %
Neutro Abs: 1.5 10*3/uL — ABNORMAL LOW (ref 1.7–7.7)
Neutrophils Relative %: 76 %
Platelets: 143 10*3/uL — ABNORMAL LOW (ref 150–400)
RBC: 2.12 MIL/uL — ABNORMAL LOW (ref 3.87–5.11)
RDW: 15.2 % (ref 11.5–15.5)
Smear Review: NORMAL
WBC: 2 10*3/uL — ABNORMAL LOW (ref 4.0–10.5)
nRBC: 0 % (ref 0.0–0.2)

## 2022-04-28 LAB — BASIC METABOLIC PANEL
Anion gap: 5 (ref 5–15)
BUN: 9 mg/dL (ref 8–23)
CO2: 27 mmol/L (ref 22–32)
Calcium: 7.7 mg/dL — ABNORMAL LOW (ref 8.9–10.3)
Chloride: 103 mmol/L (ref 98–111)
Creatinine, Ser: 0.47 mg/dL (ref 0.44–1.00)
GFR, Estimated: >60 mL/min (ref >60)
Glucose, Bld: 87 mg/dL (ref 70–99)
Potassium: 3.3 mmol/L — ABNORMAL LOW (ref 3.5–5.1)
Sodium: 135 mmol/L (ref 135–145)

## 2022-04-28 LAB — PREPARE RBC (CROSSMATCH)

## 2022-04-28 MED ORDER — HEPARIN SOD (PORK) LOCK FLUSH 100 UNIT/ML IV SOLN
500.0000 [IU] | Freq: Every day | INTRAVENOUS | Status: AC | PRN
  Administered 2022-04-28: 500 [IU]
  Filled 2022-04-28: qty 5

## 2022-04-28 MED ORDER — SODIUM CHLORIDE 0.9% IV SOLUTION
250.0000 mL | Freq: Once | INTRAVENOUS | Status: AC
  Administered 2022-04-28: 250 mL via INTRAVENOUS
  Filled 2022-04-28: qty 250

## 2022-04-28 NOTE — Patient Instructions (Signed)
Blood Transfusion, Adult, Care After This sheet gives you information about how to care for yourself after your procedure. Your doctor may also give you more specific instructions. If you have problems or questions, contact your doctor. What can I expect after the procedure? After the procedure, it is common to have: Bruising and soreness at the IV site. A headache. Follow these instructions at home: Insertion site care     Follow instructions from your doctor about how to take care of your insertion site. This is where an IV tube was put into your vein. Make sure you: Wash your hands with soap and water before and after you change your bandage (dressing). If you cannot use soap and water, use hand sanitizer. Change your bandage as told by your doctor. Check your insertion site every day for signs of infection. Check for: Redness, swelling, or pain. Bleeding from the site. Warmth. Pus or a bad smell. General instructions Take over-the-counter and prescription medicines only as told by your doctor. Rest as told by your doctor. Go back to your normal activities as told by your doctor. Keep all follow-up visits as told by your doctor. This is important. Contact a doctor if: You have itching or red, swollen areas of skin (hives). You feel worried or nervous (anxious). You feel weak after doing your normal activities. You have redness, swelling, warmth, or pain around the insertion site. You have blood coming from the insertion site, and the blood does not stop with pressure. You have pus or a bad smell coming from the insertion site. Get help right away if: You have signs of a serious reaction. This may be coming from an allergy or the body's defense system (immune system). Signs include: Trouble breathing or shortness of breath. Swelling of the face or feeling warm (flushed). Fever or chills. Head, chest, or back pain. Dark pee (urine) or blood in the pee. Widespread rash. Fast  heartbeat. Feeling dizzy or light-headed. You may receive your blood transfusion in an outpatient setting. If so, you will be told whom to contact to report any reactions. These symptoms may be an emergency. Do not wait to see if the symptoms will go away. Get medical help right away. Call your local emergency services (911 in the [place] Do not drive yourself to the hospital. Summary Bruising and soreness at the IV site are common. Check your insertion site every day for signs of infection. Rest as told by your doctor. Go back to your normal activities as told by your doctor. Get help right away if you have signs of a serious reaction. This information is not intended to replace advice given to you by your health care provider. Make sure you discuss any questions you have with your health care provider. Document Revised: 03/13/2021 Document Reviewed: 05/11/2019 Elsevier Patient Education  2023 Elsevier Inc.  

## 2022-04-28 NOTE — Progress Notes (Signed)
Release orders for blood today

## 2022-04-29 LAB — TYPE AND SCREEN
ABO/RH(D): O POS
Antibody Screen: NEGATIVE
Unit division: 0

## 2022-04-29 LAB — BPAM RBC
Blood Product Expiration Date: 202306092359
ISSUE DATE / TIME: 202305301332
Unit Type and Rh: 5100

## 2022-04-30 ENCOUNTER — Telehealth: Payer: Self-pay | Admitting: Nurse Practitioner

## 2022-04-30 NOTE — Telephone Encounter (Signed)
Attempted to contact patient again to reschedule the Palliative Consult, no answer and unable to leave message due to mailbox still full.  I will cancel the referral and notify referring provider and Palliative Team.

## 2022-05-01 ENCOUNTER — Encounter: Payer: Self-pay | Admitting: Medical Oncology

## 2022-05-01 ENCOUNTER — Inpatient Hospital Stay (HOSPITAL_BASED_OUTPATIENT_CLINIC_OR_DEPARTMENT_OTHER): Payer: BC Managed Care – PPO | Admitting: Medical Oncology

## 2022-05-01 ENCOUNTER — Inpatient Hospital Stay: Payer: BC Managed Care – PPO | Attending: Oncology

## 2022-05-01 ENCOUNTER — Other Ambulatory Visit: Payer: Self-pay | Admitting: Pharmacist

## 2022-05-01 ENCOUNTER — Inpatient Hospital Stay: Payer: BC Managed Care – PPO

## 2022-05-01 VITALS — BP 135/84 | HR 95 | Resp 16

## 2022-05-01 VITALS — BP 125/84 | HR 96 | Temp 98.5°F | Resp 16 | Ht 66.0 in | Wt 130.5 lb

## 2022-05-01 DIAGNOSIS — Z66 Do not resuscitate: Secondary | ICD-10-CM | POA: Diagnosis not present

## 2022-05-01 DIAGNOSIS — T451X5A Adverse effect of antineoplastic and immunosuppressive drugs, initial encounter: Secondary | ICD-10-CM | POA: Insufficient documentation

## 2022-05-01 DIAGNOSIS — C189 Malignant neoplasm of colon, unspecified: Secondary | ICD-10-CM

## 2022-05-01 DIAGNOSIS — R5383 Other fatigue: Secondary | ICD-10-CM | POA: Diagnosis not present

## 2022-05-01 DIAGNOSIS — E876 Hypokalemia: Secondary | ICD-10-CM

## 2022-05-01 DIAGNOSIS — Z809 Family history of malignant neoplasm, unspecified: Secondary | ICD-10-CM | POA: Insufficient documentation

## 2022-05-01 DIAGNOSIS — D702 Other drug-induced agranulocytosis: Secondary | ICD-10-CM | POA: Diagnosis not present

## 2022-05-01 DIAGNOSIS — Z823 Family history of stroke: Secondary | ICD-10-CM | POA: Diagnosis not present

## 2022-05-01 DIAGNOSIS — D509 Iron deficiency anemia, unspecified: Secondary | ICD-10-CM | POA: Diagnosis not present

## 2022-05-01 DIAGNOSIS — E860 Dehydration: Secondary | ICD-10-CM

## 2022-05-01 DIAGNOSIS — R61 Generalized hyperhidrosis: Secondary | ICD-10-CM | POA: Diagnosis not present

## 2022-05-01 DIAGNOSIS — D649 Anemia, unspecified: Secondary | ICD-10-CM | POA: Diagnosis not present

## 2022-05-01 DIAGNOSIS — M7989 Other specified soft tissue disorders: Secondary | ICD-10-CM | POA: Diagnosis not present

## 2022-05-01 DIAGNOSIS — Z515 Encounter for palliative care: Secondary | ICD-10-CM | POA: Diagnosis not present

## 2022-05-01 DIAGNOSIS — D701 Agranulocytosis secondary to cancer chemotherapy: Secondary | ICD-10-CM | POA: Insufficient documentation

## 2022-05-01 DIAGNOSIS — Z933 Colostomy status: Secondary | ICD-10-CM | POA: Diagnosis not present

## 2022-05-01 DIAGNOSIS — C187 Malignant neoplasm of sigmoid colon: Secondary | ICD-10-CM | POA: Diagnosis present

## 2022-05-01 DIAGNOSIS — T7840XA Allergy, unspecified, initial encounter: Secondary | ICD-10-CM | POA: Diagnosis not present

## 2022-05-01 DIAGNOSIS — R531 Weakness: Secondary | ICD-10-CM | POA: Insufficient documentation

## 2022-05-01 DIAGNOSIS — C787 Secondary malignant neoplasm of liver and intrahepatic bile duct: Secondary | ICD-10-CM | POA: Diagnosis not present

## 2022-05-01 DIAGNOSIS — Z79899 Other long term (current) drug therapy: Secondary | ICD-10-CM | POA: Insufficient documentation

## 2022-05-01 DIAGNOSIS — R232 Flushing: Secondary | ICD-10-CM | POA: Insufficient documentation

## 2022-05-01 DIAGNOSIS — R1110 Vomiting, unspecified: Secondary | ICD-10-CM | POA: Insufficient documentation

## 2022-05-01 DIAGNOSIS — Z8249 Family history of ischemic heart disease and other diseases of the circulatory system: Secondary | ICD-10-CM | POA: Insufficient documentation

## 2022-05-01 LAB — CBC WITH DIFFERENTIAL/PLATELET
Abs Immature Granulocytes: 0.1 10*3/uL — ABNORMAL HIGH (ref 0.00–0.07)
Basophils Absolute: 0 10*3/uL (ref 0.0–0.1)
Basophils Relative: 1 %
Eosinophils Absolute: 0 10*3/uL (ref 0.0–0.5)
Eosinophils Relative: 1 %
HCT: 23.5 % — ABNORMAL LOW (ref 36.0–46.0)
Hemoglobin: 7.6 g/dL — ABNORMAL LOW (ref 12.0–15.0)
Immature Granulocytes: 6 %
Lymphocytes Relative: 18 %
Lymphs Abs: 0.3 10*3/uL — ABNORMAL LOW (ref 0.7–4.0)
MCH: 28.7 pg (ref 26.0–34.0)
MCHC: 32.3 g/dL (ref 30.0–36.0)
MCV: 88.7 fL (ref 80.0–100.0)
Monocytes Absolute: 0.1 10*3/uL (ref 0.1–1.0)
Monocytes Relative: 6 %
Neutro Abs: 1.2 10*3/uL — ABNORMAL LOW (ref 1.7–7.7)
Neutrophils Relative %: 68 %
Platelets: 138 10*3/uL — ABNORMAL LOW (ref 150–400)
RBC: 2.65 MIL/uL — ABNORMAL LOW (ref 3.87–5.11)
RDW: 14.6 % (ref 11.5–15.5)
Smear Review: NORMAL
WBC: 1.8 10*3/uL — ABNORMAL LOW (ref 4.0–10.5)
nRBC: 0 % (ref 0.0–0.2)

## 2022-05-01 LAB — BASIC METABOLIC PANEL
Anion gap: 9 (ref 5–15)
BUN: 8 mg/dL (ref 8–23)
CO2: 24 mmol/L (ref 22–32)
Calcium: 7.9 mg/dL — ABNORMAL LOW (ref 8.9–10.3)
Chloride: 102 mmol/L (ref 98–111)
Creatinine, Ser: 0.49 mg/dL (ref 0.44–1.00)
GFR, Estimated: >60 mL/min (ref >60)
Glucose, Bld: 79 mg/dL (ref 70–99)
Potassium: 3.3 mmol/L — ABNORMAL LOW (ref 3.5–5.1)
Sodium: 135 mmol/L (ref 135–145)

## 2022-05-01 LAB — SAMPLE TO BLOOD BANK

## 2022-05-01 MED ORDER — POTASSIUM CHLORIDE 20 MEQ/100ML IV SOLN
20.0000 meq | Freq: Once | INTRAVENOUS | Status: AC
  Administered 2022-05-01: 20 meq via INTRAVENOUS

## 2022-05-01 MED ORDER — HEPARIN SOD (PORK) LOCK FLUSH 100 UNIT/ML IV SOLN
500.0000 [IU] | Freq: Once | INTRAVENOUS | Status: AC
  Administered 2022-05-01: 500 [IU] via INTRAVENOUS
  Filled 2022-05-01: qty 5

## 2022-05-01 MED ORDER — SODIUM CHLORIDE 0.9% FLUSH
10.0000 mL | Freq: Once | INTRAVENOUS | Status: AC
  Administered 2022-05-01: 10 mL via INTRAVENOUS
  Filled 2022-05-01: qty 10

## 2022-05-01 MED ORDER — SODIUM CHLORIDE 0.9 % IV SOLN
Freq: Once | INTRAVENOUS | Status: AC
  Filled 2022-05-01: qty 250

## 2022-05-01 MED ORDER — LONSURF 15-6.14 MG PO TABS: 45.0000 mg | ORAL_TABLET | Freq: Two times a day (BID) | ORAL | 1 refills | Status: DC

## 2022-05-01 NOTE — Patient Instructions (Signed)
Potassium Chloride Injection What is this medication? POTASSIUM CHLORIDE (poe TASS i um KLOOR ide) prevents and treats low levels of potassium in your body. Potassium plays an important role in maintaining the health of your kidneys, heart, muscles, and nervous system. This medicine may be used for other purposes; ask your health care provider or pharmacist if you have questions. COMMON BRAND NAME(S): PROAMP What should I tell my care team before I take this medication? They need to know if you have any of these conditions: Addison disease Dehydration Diabetes (high blood sugar) Heart disease High levels of potassium in the blood Irregular heartbeat or rhythm Kidney disease Large areas of burned skin An unusual or allergic reaction to potassium, other medications, foods, dyes, or preservatives Pregnant or trying to get pregnant Breast-feeding How should I use this medication? This medication is injected into a vein. It is given in a hospital or clinic setting. Talk to your care team about the use of this medication in children. Special care may be needed. Overdosage: If you think you have taken too much of this medicine contact a poison control center or emergency room at once. NOTE: This medicine is only for you. Do not share this medicine with others. What if I miss a dose? This does not apply. This medication is not for regular use. What may interact with this medication? Do not take this medication with any of the following: Certain diuretics such as spironolactone, triamterene Eplerenone Sodium polystyrene sulfonate This medication may also interact with the following: Certain medications for blood pressure or heart disease like lisinopril, losartan, quinapril, valsartan Medications that lower your chance of fighting infection such as cyclosporine, tacrolimus NSAIDs, medications for pain and inflammation, like ibuprofen or naproxen Other potassium supplements Salt  substitutes This list may not describe all possible interactions. Give your health care provider a list of all the medicines, herbs, non-prescription drugs, or dietary supplements you use. Also tell them if you smoke, drink alcohol, or use illegal drugs. Some items may interact with your medicine. What should I watch for while using this medication? Visit your care team for regular checks on your progress. Tell your care team if your symptoms do not start to get better or if they get worse. You may need blood work while you are taking this medication. Avoid salt substitutes unless you are told otherwise by your care team. What side effects may I notice from receiving this medication? Side effects that you should report to your care team as soon as possible: Allergic reactions--skin rash, itching, hives, swelling of the face, lips, tongue, or throat High potassium level--muscle weakness, fast or irregular heartbeat Side effects that usually do not require medical attention (report to your care team if they continue or are bothersome): Diarrhea Nausea Stomach pain Vomiting This list may not describe all possible side effects. Call your doctor for medical advice about side effects. You may report side effects to FDA at 1-800-FDA-1088. Where should I keep my medication? This medication is given in a hospital or clinic. It will not be stored at home. NOTE: This sheet is a summary. It may not cover all possible information. If you have questions about this medicine, talk to your doctor, pharmacist, or health care provider.  2023 Elsevier/Gold Standard (2021-03-04 00:00:00)  

## 2022-05-01 NOTE — Progress Notes (Signed)
I was able to speak with the patient's sister, she knows that Dr. Janese Banks wants to decrease Ms. Tandy dose of Lonsurf and a new prescription was sent to [facility].   Ms. Kasprzak is schedule to restart her Lonsurf on Monday and will likely not have the medication in hand by then. To stay on schedule, her sister was instructed to begin Monday using the on hand '20mg'$  tablets, taking 2 tablet ('60mg'$  total) bid. Once she receives the new dose, she will give me a call and then we will have her start taking the 15 mg tablets, 3 tablet ('45mg'$  total) bid.

## 2022-05-01 NOTE — Progress Notes (Signed)
Attempted to call patient/patient's sister Otilio Saber to discuss dose decrease, unable to reach any one. LVM for [place]

## 2022-05-01 NOTE — Progress Notes (Signed)
Hematology/Oncology Consult note [facility]  Telephone:([phone] [phone]  Patient Care Team: Pcp, No as PCP - General Clent Jacks, RN as Oncology Nurse Navigator Sindy Guadeloupe, MD as Consulting Physician (Oncology) [name], MD as Consulting Physician (Gastroenterology) Jules Husbands, MD as Consulting Physician (General Surgery)   Name of the patient: Alicia Coleman  [id number]  [birth year]   Date of visit: 05/01/22  Diagnosis- metastatic colon cancer with liver metastases  Chief complaint/ Reason for visit-routine follow-up of colon cancer presently on Lonsurf  Heme/Onc history: Patient is a 66 year old female who was referred from the ER for severe iron deficiency anemia.  She received IV Venofer for that.  I was concerned about malignancy given the degree of anemia and therefore obtain CT abdomen and pelvis with contrast CT showed 6.4 cm enhancing mass in the right liver and another mass measuring 4 cm.  Large volume stool in the right colon transverse and descending colon with irregular mass lesion in the distal sigmoid colon extending into the rectosigmoid junction with lateral extracolonic extension into the pelvic sidewall.  Apparent luminal narrowing at the level of the lesion which accounts for large volume of stool proximally.  Multiple pericolonic and perirectal lymph nodes.  Multiple peritoneal nodules are seen in the lower right pelvic sidewall.  Colon mass appears to be invading posterior uterus.Thrombosis of the superior rectal vein may be bland thrombus although direct tumor invasion not excluded.   Patient had a flexible sigmoidoscopy which showed a large obstructing mass in the rectosigmoid colon.  Scope could not be traversed beyond the obstruction.  Biopsies were taken and were consistent with adenocarcinoma. Patient had a diverting colostomy on 05/01/2021 with Dr. Dahlia Byes   Patient started palliative FOLFOX chemotherapy in  June 2022 with plans to add Avastin after 6 cycles of treatment.  Patient developed allergic reaction to oxaliplatin with cycle 10 as evidenced by diaphoresis vomiting and flushing.  Plan is to hold off on further doses of oxaliplatin.  She has K-ras mutation positive   Progression noted on scans in January 2023.  Plan to switch to FOLFIRI bevacizumab.  Bevacizumab was stopped after there was concern for bleeding from the primary colorectal mass.  Chemotherapy on hold since 02/17/2022 due to poor performance status  Disease progression in May 2023 and patient switched to [facility].  Interval history- Patient presents with her daughter. She reports that overall she is feeling better. She rests most of the day but does get up to go to the bathroom and complete some ADLs. Appetite has improved some. Was try to increase potassium intake but has not done this recently- doesn't want pill supplementation. Her peripheral leg swelling is stable.   ECOG PS- 2 Pain scale- 0 Opioid associated constipation- no  Review of systems- Review of Systems  Constitutional:  Positive for malaise/fatigue. Negative for chills, fever and weight loss.  HENT:  Negative for congestion, ear discharge and nosebleeds.   Eyes:  Negative for blurred vision.  Respiratory:  Negative for cough, hemoptysis, sputum production, shortness of breath and wheezing.   Cardiovascular:  Positive for leg swelling. Negative for chest pain, palpitations, orthopnea and claudication.  Gastrointestinal:  Negative for abdominal pain, blood in stool, constipation, diarrhea, heartburn, melena, nausea and vomiting.  Genitourinary:  Negative for dysuria, flank pain, frequency, hematuria and urgency.  Musculoskeletal:  Negative for back pain, joint pain and myalgias.  Skin:  Negative for rash.  Neurological:  Negative for dizziness, tingling,  focal weakness, seizures, weakness and headaches.  Endo/Heme/Allergies:  Does not bruise/bleed easily.   Psychiatric/Behavioral:  Negative for depression and suicidal ideas. The patient does not have insomnia.       Allergies  Allergen Reactions   Oxaliplatin Nausea And Vomiting     Past Medical History:  Diagnosis Date   Allergy    Anemia    Cancer ([place])    Colon cancer ([place])    GI bleed      Past Surgical History:  Procedure Laterality Date   COLONOSCOPY WITH PROPOFOL N/A 01/16/2022   Procedure: COLONOSCOPY WITH PROPOFOL;  Surgeon: [name], MD;  Location: [facility] ENDOSCOPY;  Service: Gastroenterology;  Laterality: N/A;   ESOPHAGOGASTRODUODENOSCOPY  01/16/2022   Procedure: ESOPHAGOGASTRODUODENOSCOPY (EGD);  Surgeon: [name], MD;  Location: [facility] ENDOSCOPY;  Service: Gastroenterology;;   Beryle Quant N/A 04/24/2021   Procedure: Beryle Quant;  Surgeon: [name], MD;  Location: [facility] ENDOSCOPY;  Service: Gastroenterology;  Laterality: N/A;   IR CV LINE INJECTION  11/13/2021   IR IMAGING GUIDED PORT INSERTION  11/19/2021   IR REMOVAL TUN ACCESS W/ PORT W/O FL MOD SED  11/19/2021   PORTACATH PLACEMENT N/A 05/01/2021   Procedure: INSERTION PORT-A-CATH;  Surgeon: Jules Husbands, MD;  Location: [facility] ORS;  Service: General;  Laterality: N/A;    Social History   Socioeconomic History   Marital status: Married    Spouse name: Not on file   Number of children: Not on file   Years of education: Not on file   Highest education level: Not on file  Occupational History   Not on file  Tobacco Use   Smoking status: Never   Smokeless tobacco: Never  Vaping Use   Vaping Use: Never used  Substance and Sexual Activity   Alcohol use: Not Currently   Drug use: Never   Sexual activity: Not Currently  Other Topics Concern   Not on file  Social History Narrative   Not on file   Social Determinants of Health   Financial Resource Strain: Not on file  Food Insecurity: Not on file  Transportation Needs: Not on file  Physical Activity: Not on file  Stress: Not on  file  Social Connections: Not on file  Intimate Partner Violence: Not on file    Family History  Problem Relation Age of Onset   Stroke Mother    Hypertension Mother    Cancer Father      Current Outpatient Medications:    acetaminophen (TYLENOL) 325 MG tablet, Take 650 mg by mouth every 6 (six) hours as needed., Disp: , Rfl:    feeding supplement (ENSURE ENLIVE / ENSURE PLUS) LIQD, Take 237 mLs by mouth 3 (three) times daily between meals., Disp: 14220 mL, Rfl: 0   lidocaine-prilocaine (EMLA) cream, Apply 1 application. topically as needed. Apply to port and cover with saran wrap 1-2 hours prior to port access, Disp: 30 g, Rfl: 4   Multiple Vitamin (MULTIVITAMIN WITH MINERALS) TABS tablet, Take 1 tablet by mouth daily., Disp: 30 tablet, Rfl: 0   phosphorus (K PHOS NEUTRAL) 155-852-130 MG tablet, Take 2 tablets (500 mg total) by mouth 3 (three) times daily., Disp: 90 tablet, Rfl: 1   Simethicone (GAS-X MAXIMUM STRENGTH PO), Take by mouth., Disp: , Rfl:    trifluridine-tipiracil (LONSURF) 20-8.19 MG tablet, Take 3 tablets (60 mg of trifluridine total) by mouth 2 (two) times daily after a meal. Take within 1 hr of AM & PM meals on days 1-5. Repeat  every 14 days, Disp: 60 tablet, Rfl: 1   potassium chloride SA (KLOR-CON M) 20 MEQ tablet, Take 20 mEq by mouth daily. (Patient not taking: Reported on 04/01/2022), Disp: , Rfl:    predniSONE (DELTASONE) 10 MG tablet, Take 1 tablet (10 mg total) by mouth daily with breakfast. (Patient not taking: Reported on 04/01/2022), Disp: 14 tablet, Rfl: 0 No current facility-administered medications for this visit.  Facility-Administered Medications Ordered in Other Visits:    0.9 %  sodium chloride infusion, , Intravenous, Once, Sindy Guadeloupe, MD   dexamethasone (DECADRON) 10 mg in sodium chloride 0.9 % 50 mL IVPB, 10 mg, Intravenous, Once, Sindy Guadeloupe, MD   heparin lock flush 100 unit/mL, 500 Units, Intravenous, Once, Franklyn Cafaro M, PA-C   potassium  chloride 20 mEq in 100 mL IVPB, 20 mEq, Intravenous, Once, Golda Zavalza M, PA-C, Last Rate: 100 mL/hr at 05/01/22 1010, 20 mEq at 05/01/22 1010  Physical exam:  Vitals:   05/01/22 0938 05/01/22 0939  BP: 125/84   Pulse: 96   Resp: 16   Temp: 98.5 F (36.9 C)   TempSrc: Tympanic   SpO2: 100%   Weight: 130 lb (59 kg) 130 lb 8 oz (59.2 kg)  Height: '5\' 6"'  (1.676 m)    Physical Exam Constitutional:      General: She is not in acute distress.    Comments: She is fatigued and cachectic  Cardiovascular:     Rate and Rhythm: Normal rate and regular rhythm.     Heart sounds: Normal heart sounds.  Pulmonary:     Effort: Pulmonary effort is normal.     Breath sounds: Normal breath sounds.  Abdominal:     General: Bowel sounds are normal.     Palpations: Abdomen is soft.     Comments: Colostomy in place  Musculoskeletal:     Right lower leg: Edema present.     Left lower leg: Edema present.  Skin:    General: Skin is warm and dry.  Neurological:     Mental Status: She is alert and oriented to person, place, and time.        Latest Ref Rng & Units 05/01/2022    9:17 AM  CMP  Glucose 70 - 99 mg/dL 79    BUN 8 - 23 mg/dL 8    Creatinine 0.44 - 1.00 mg/dL 0.49    Sodium 135 - 145 mmol/L 135    Potassium 3.5 - 5.1 mmol/L 3.3    Chloride 98 - 111 mmol/L 102    CO2 22 - 32 mmol/L 24    Calcium 8.9 - 10.3 mg/dL 7.9        Latest Ref Rng & Units 05/01/2022    9:17 AM  CBC  WBC 4.0 - 10.5 K/uL 1.8    Hemoglobin 12.0 - 15.0 g/dL 7.6    Hematocrit 36.0 - 46.0 % 23.5    Platelets 150 - 400 K/uL 138      No images are attached to the encounter.  No results found.   Assessment and plan- Patient is a 66 y.o. female with metastatic colon cancer with progression on FOLFOX and FOLFIRI chemotherapy presently on third line Lonsurf here for routine follow-up  Patient recently started taking her Lonsurf- finished Cycle 1. Tolerated fairly well. WBC (1.8 from 1.5)and ANC (1.2 from 1.0)  have improved and her hemoglobin (7.6 from 6.0) is also improved after her 1 unit of PRBC. Plan will be for her to return  in 1 week for labs/fluids. She received IV potassium today and will increase her oral potassium intake.   RTC next weeks for labs (CBC, CMP), fluids.  RTC Dr. Janese Banks as previously scheduled.   Bilateral lower extremity edema likely secondary to hypoalbuminemia.  Continue to monitor   Visit Diagnosis 1. Symptomatic anemia   2. Hypokalemia   3. Primary malignant neoplasm of colon with metastasis in female ([place])   4. Drug-induced neutropenia (HCC)   5. Dehydration      Nelwyn Salisbury PA-C [place] at [facility] 05/01/2022 10:41 AM

## 2022-05-04 MED ORDER — LONSURF 15-6.14 MG PO TABS: 45.0000 mg | ORAL_TABLET | Freq: Two times a day (BID) | ORAL | 1 refills | Status: AC

## 2022-05-04 NOTE — Addendum Note (Signed)
Addended by: Darl Pikes on: 05/04/2022 11:55 AM   Modules accepted: Orders

## 2022-05-05 ENCOUNTER — Telehealth: Payer: Self-pay | Admitting: Pharmacist

## 2022-05-05 NOTE — Telephone Encounter (Signed)
Oral Chemotherapy Pharmacist Encounter   Patient's sister/caregiver called to report that they received the new '15mg'$  tablets of Lonsurf. She had not yet give the dose for this morning, so she will begin switching to the '15mg'$  tablets starting this morning. Reviewed the adminin instructions with her. She knows that [name] is to stay on schedule and stop her Lonsurf as planned on Friday 05/08/22.   Darl Pikes, PharmD, BCPS, BCOP, CPP Hematology/Oncology Clinical Pharmacist [place]/DB/AP Oral [facility] [phone]  05/05/2022 12:49 PM

## 2022-05-07 ENCOUNTER — Inpatient Hospital Stay: Payer: BC Managed Care – PPO

## 2022-05-07 VITALS — BP 118/83 | HR 88 | Temp 96.8°F | Resp 17

## 2022-05-07 DIAGNOSIS — E860 Dehydration: Secondary | ICD-10-CM

## 2022-05-07 DIAGNOSIS — C187 Malignant neoplasm of sigmoid colon: Secondary | ICD-10-CM | POA: Diagnosis not present

## 2022-05-07 DIAGNOSIS — C189 Malignant neoplasm of colon, unspecified: Secondary | ICD-10-CM

## 2022-05-07 DIAGNOSIS — D649 Anemia, unspecified: Secondary | ICD-10-CM

## 2022-05-07 LAB — CBC WITH DIFFERENTIAL/PLATELET
Abs Immature Granulocytes: 0.17 10*3/uL — ABNORMAL HIGH (ref 0.00–0.07)
Basophils Absolute: 0 10*3/uL (ref 0.0–0.1)
Basophils Relative: 0 %
Eosinophils Absolute: 0 10*3/uL (ref 0.0–0.5)
Eosinophils Relative: 0 %
HCT: 23.3 % — ABNORMAL LOW (ref 36.0–46.0)
Hemoglobin: 7.3 g/dL — ABNORMAL LOW (ref 12.0–15.0)
Immature Granulocytes: 4 %
Lymphocytes Relative: 11 %
Lymphs Abs: 0.4 10*3/uL — ABNORMAL LOW (ref 0.7–4.0)
MCH: 28.3 pg (ref 26.0–34.0)
MCHC: 31.3 g/dL (ref 30.0–36.0)
MCV: 90.3 fL (ref 80.0–100.0)
Monocytes Absolute: 0.4 10*3/uL (ref 0.1–1.0)
Monocytes Relative: 10 %
Neutro Abs: 2.9 10*3/uL (ref 1.7–7.7)
Neutrophils Relative %: 75 %
Platelets: 271 10*3/uL (ref 150–400)
RBC: 2.58 MIL/uL — ABNORMAL LOW (ref 3.87–5.11)
RDW: 15.3 % (ref 11.5–15.5)
Smear Review: NORMAL
WBC: 3.9 10*3/uL — ABNORMAL LOW (ref 4.0–10.5)
nRBC: 0 % (ref 0.0–0.2)

## 2022-05-07 LAB — BASIC METABOLIC PANEL
Anion gap: 7 (ref 5–15)
BUN: 10 mg/dL (ref 8–23)
CO2: 26 mmol/L (ref 22–32)
Calcium: 8 mg/dL — ABNORMAL LOW (ref 8.9–10.3)
Chloride: 101 mmol/L (ref 98–111)
Creatinine, Ser: 0.47 mg/dL (ref 0.44–1.00)
GFR, Estimated: >60 mL/min (ref >60)
Glucose, Bld: 90 mg/dL (ref 70–99)
Potassium: 3.5 mmol/L (ref 3.5–5.1)
Sodium: 134 mmol/L — ABNORMAL LOW (ref 135–145)

## 2022-05-07 LAB — SAMPLE TO BLOOD BANK

## 2022-05-07 MED ORDER — SODIUM CHLORIDE 0.9 % IV SOLN
Freq: Once | INTRAVENOUS | Status: AC
  Filled 2022-05-07: qty 250

## 2022-05-07 MED ORDER — HEPARIN SOD (PORK) LOCK FLUSH 100 UNIT/ML IV SOLN
500.0000 [IU] | Freq: Once | INTRAVENOUS | Status: AC
  Administered 2022-05-07: 500 [IU] via INTRAVENOUS
  Filled 2022-05-07: qty 5

## 2022-05-07 MED ORDER — SODIUM CHLORIDE 0.9% FLUSH
10.0000 mL | Freq: Once | INTRAVENOUS | Status: AC
  Administered 2022-05-07: 10 mL via INTRAVENOUS
  Filled 2022-05-07: qty 10

## 2022-05-07 NOTE — Progress Notes (Signed)
Reviewed labs with Beckey Rutter. Patient is asymptomatic with her anemia, feels at her baseline. Received IV hydration, no potassium required.

## 2022-05-14 ENCOUNTER — Encounter: Payer: Self-pay | Admitting: Licensed Clinical Social Worker

## 2022-05-14 NOTE — Progress Notes (Signed)
[place] Work  Clinical Social Work was referred by medical provider for assessment of psychosocial needs.  Clinical Social Worker attempted to contact patient by phone  to offer support and assess for needs.  CSW called patient's cell phone and was unable to leave a message because the voicemail was full. CSW called patient's home phone and was able to speak with patient's sister Otilio Saber, who stated the patient was sleeping.  CSW gave Laverne contact information and updated her on CSW out-of-office information.  Laverne stated she would give the patient my message.      Adelene Amas, [place] Worker [facility]

## 2022-05-15 ENCOUNTER — Inpatient Hospital Stay: Payer: BC Managed Care – PPO

## 2022-05-15 ENCOUNTER — Inpatient Hospital Stay (HOSPITAL_BASED_OUTPATIENT_CLINIC_OR_DEPARTMENT_OTHER): Payer: BC Managed Care – PPO | Admitting: Oncology

## 2022-05-15 ENCOUNTER — Inpatient Hospital Stay (HOSPITAL_BASED_OUTPATIENT_CLINIC_OR_DEPARTMENT_OTHER): Payer: BC Managed Care – PPO | Admitting: Hospice and Palliative Medicine

## 2022-05-15 ENCOUNTER — Encounter: Payer: Self-pay | Admitting: Oncology

## 2022-05-15 VITALS — BP 121/77 | HR 93 | Temp 98.6°F | Resp 16 | Wt 124.7 lb

## 2022-05-15 DIAGNOSIS — Z7189 Other specified counseling: Secondary | ICD-10-CM

## 2022-05-15 DIAGNOSIS — C189 Malignant neoplasm of colon, unspecified: Secondary | ICD-10-CM

## 2022-05-15 DIAGNOSIS — Z515 Encounter for palliative care: Secondary | ICD-10-CM

## 2022-05-15 DIAGNOSIS — C187 Malignant neoplasm of sigmoid colon: Secondary | ICD-10-CM | POA: Diagnosis not present

## 2022-05-15 LAB — CBC WITH DIFFERENTIAL/PLATELET
Abs Immature Granulocytes: 0.11 10*3/uL — ABNORMAL HIGH (ref 0.00–0.07)
Basophils Absolute: 0 10*3/uL (ref 0.0–0.1)
Basophils Relative: 1 %
Eosinophils Absolute: 0 10*3/uL (ref 0.0–0.5)
Eosinophils Relative: 0 %
HCT: 19.8 % — ABNORMAL LOW (ref 36.0–46.0)
Hemoglobin: 6.2 g/dL — ABNORMAL LOW (ref 12.0–15.0)
Immature Granulocytes: 3 %
Lymphocytes Relative: 7 %
Lymphs Abs: 0.3 10*3/uL — ABNORMAL LOW (ref 0.7–4.0)
MCH: 27.7 pg (ref 26.0–34.0)
MCHC: 31.3 g/dL (ref 30.0–36.0)
MCV: 88.4 fL (ref 80.0–100.0)
Monocytes Absolute: 0.2 10*3/uL (ref 0.1–1.0)
Monocytes Relative: 4 %
Neutro Abs: 3.6 10*3/uL (ref 1.7–7.7)
Neutrophils Relative %: 85 %
Platelets: 143 10*3/uL — ABNORMAL LOW (ref 150–400)
RBC: 2.24 MIL/uL — ABNORMAL LOW (ref 3.87–5.11)
RDW: 15.3 % (ref 11.5–15.5)
WBC: 4.2 10*3/uL (ref 4.0–10.5)
nRBC: 0 % (ref 0.0–0.2)

## 2022-05-15 LAB — BASIC METABOLIC PANEL
Anion gap: 7 (ref 5–15)
BUN: 7 mg/dL — ABNORMAL LOW (ref 8–23)
CO2: 25 mmol/L (ref 22–32)
Calcium: 8.2 mg/dL — ABNORMAL LOW (ref 8.9–10.3)
Chloride: 103 mmol/L (ref 98–111)
Creatinine, Ser: 0.46 mg/dL (ref 0.44–1.00)
GFR, Estimated: >60 mL/min (ref >60)
Glucose, Bld: 93 mg/dL (ref 70–99)
Potassium: 3.6 mmol/L (ref 3.5–5.1)
Sodium: 135 mmol/L (ref 135–145)

## 2022-05-15 NOTE — Progress Notes (Signed)
[place] at [facility] Telephone:([phone] Fax:(336) [phone]   Name: Alicia Coleman Date: 05/15/2022 MRN: [id number]  DOB: June 22, 1956  Patient Care Team: Pcp, No as PCP - General Clent Jacks, RN as Oncology Nurse Navigator Sindy Guadeloupe, MD as Consulting Physician (Oncology) [name], MD as Consulting Physician (Gastroenterology) Jules Husbands, MD as Consulting Physician (General Surgery)    REASON FOR CONSULTATION: Deanda Ruddell is a 66 y.o. female with multiple medical problems including metastatic colon cancer with liver metastasis started on FOLFOX chemotherapy in June 2022 with disease progression noted in January 2023 and patient was rotated to FOLFIRI plus Bev.  CTs in February 2023 are concerning for disease progression.  Patient was tried on Lonsurf.  Patient has had declining performance status.  She is referred to palliative care to help address goals.  SOCIAL HISTORY:     reports that she has never smoked. She has never used smokeless tobacco. She reports that she does not currently use alcohol. She reports that she does not use drugs.  Patient is unmarried and has no children.  She lives at home with her brother.  She has multiple siblings who are involved in her care.  ADVANCE DIRECTIVES:  Does not have  CODE STATUS: DNR/DNI (DNR form completed on 03/11/2022)  PAST MEDICAL HISTORY: Past Medical History:  Diagnosis Date   Allergy    Anemia    Cancer ([place])    Colon cancer ([place])    GI bleed     PAST SURGICAL HISTORY:  Past Surgical History:  Procedure Laterality Date   COLONOSCOPY WITH PROPOFOL N/A 01/16/2022   Procedure: COLONOSCOPY WITH PROPOFOL;  Surgeon: [name], MD;  Location: [facility] ENDOSCOPY;  Service: Gastroenterology;  Laterality: N/A;   ESOPHAGOGASTRODUODENOSCOPY  01/16/2022   Procedure: ESOPHAGOGASTRODUODENOSCOPY (EGD);  Surgeon: [name], MD;  Location: [facility] ENDOSCOPY;  Service:  Gastroenterology;;   Beryle Quant N/A 04/24/2021   Procedure: Beryle Quant;  Surgeon: [name], MD;  Location: [facility] ENDOSCOPY;  Service: Gastroenterology;  Laterality: N/A;   IR CV LINE INJECTION  11/13/2021   IR IMAGING GUIDED PORT INSERTION  11/19/2021   IR REMOVAL TUN ACCESS W/ PORT W/O FL MOD SED  11/19/2021   PORTACATH PLACEMENT N/A 05/01/2021   Procedure: INSERTION PORT-A-CATH;  Surgeon: Jules Husbands, MD;  Location: [facility] ORS;  Service: General;  Laterality: N/A;    HEMATOLOGY/ONCOLOGY HISTORY:  Oncology History  Primary malignant neoplasm of colon with metastasis in female [facility])  04/25/2021 Initial Diagnosis   Metastatic colon cancer in female [facility])   04/25/2021 Cancer Staging   Staging form: Colon and Rectum, AJCC 8th Edition - Clinical stage from 04/25/2021: Stage IVC (cT4b, cN2, cM1c) - Signed by Sindy Guadeloupe, MD on 04/25/2021 Stage prefix: Initial diagnosis   05/19/2021 -  Chemotherapy   Patient is on Treatment Plan : COLORECTAL FOLFOX + Bevacizumab q14d       ALLERGIES:  is allergic to oxaliplatin.  MEDICATIONS:  Current Outpatient Medications  Medication Sig Dispense Refill   acetaminophen (TYLENOL) 325 MG tablet Take 650 mg by mouth every 6 (six) hours as needed.     feeding supplement (ENSURE ENLIVE / ENSURE PLUS) LIQD Take 237 mLs by mouth 3 (three) times daily between meals. 14220 mL 0   lidocaine-prilocaine (EMLA) cream Apply 1 application. topically as needed. Apply to port and cover with saran wrap 1-2 hours prior to port access 30 g 4   Multiple Vitamin (MULTIVITAMIN WITH  MINERALS) TABS tablet Take 1 tablet by mouth daily. 30 tablet 0   potassium chloride SA (KLOR-CON M) 20 MEQ tablet Take 20 mEq by mouth daily. (Patient not taking: Reported on 04/01/2022)     predniSONE (DELTASONE) 10 MG tablet Take 1 tablet (10 mg total) by mouth daily with breakfast. (Patient not taking: Reported on 04/01/2022) 14 tablet 0   Simethicone (GAS-X MAXIMUM  STRENGTH PO) Take by mouth.     trifluridine-tipiracil (LONSURF) 15-6.14 MG tablet Take 3 tablets (45 mg of trifluridine total) by mouth 2 (two) times daily after a meal. Take within 1 hr of AM & PM meals on days 1-5. Repeat every 14 days 60 tablet 1   No current facility-administered medications for this visit.   Facility-Administered Medications Ordered in Other Visits  Medication Dose Route Frequency Provider Last Rate Last Admin   0.9 %  sodium chloride infusion   Intravenous Once Sindy Guadeloupe, MD       dexamethasone (DECADRON) 10 mg in sodium chloride 0.9 % 50 mL IVPB  10 mg Intravenous Once Sindy Guadeloupe, MD        VITAL SIGNS: LMP  (LMP Unknown)  There were no vitals filed for this visit.  Estimated body mass index is 20.13 kg/m as calculated from the following:   Height as of 05/01/22: '5\' 6"'  (1.676 m).   Weight as of an earlier encounter on 05/15/22: 124 lb 11.2 oz (56.6 kg).  LABS: CBC:    Component Value Date/Time   WBC 4.2 05/15/2022 0944   HGB 6.2 (L) 05/15/2022 0944   HCT 19.8 (L) 05/15/2022 0944   PLT 143 (L) 05/15/2022 0944   MCV 88.4 05/15/2022 0944   NEUTROABS 3.6 05/15/2022 0944   LYMPHSABS 0.3 (L) 05/15/2022 0944   MONOABS 0.2 05/15/2022 0944   EOSABS 0.0 05/15/2022 0944   BASOSABS 0.0 05/15/2022 0944   Comprehensive Metabolic Panel:    Component Value Date/Time   NA 135 05/15/2022 0944   K 3.6 05/15/2022 0944   CL 103 05/15/2022 0944   CO2 25 05/15/2022 0944   BUN 7 (L) 05/15/2022 0944   CREATININE 0.46 05/15/2022 0944   GLUCOSE 93 05/15/2022 0944   CALCIUM 8.2 (L) 05/15/2022 0944   AST 20 04/24/2022 1116   ALT 12 04/24/2022 1116   ALKPHOS 171 (H) 04/24/2022 1116   BILITOT 0.6 04/24/2022 1116   PROT 5.9 (L) 04/24/2022 1116   ALBUMIN 1.5 (L) 04/24/2022 1116    RADIOGRAPHIC STUDIES: No results found.  PERFORMANCE STATUS (ECOG) : 3 - Symptomatic, >50% confined to bed  Review of Systems Unless otherwise noted, a complete review of systems is  negative.  Physical Exam General: NAD Pulmonary: Unlabored Abdomen: soft, nontender, + bowel sounds GU: no suprapubic tenderness Extremities: Bilateral pedal edema, no joint deformities Skin: no rashes Neurological: Weakness but otherwise nonfocal  IMPRESSION: Met with patient and her sister today following their visit with Dr. Janese Banks.  Unfortunately, patient is doing poorly with declining performance status.  She has worsening anemia, likely attributed to [place].  Dr. Janese Banks requested that I addressed goals with patient today.  We discussed option of hospice in detail.  Patient and sister requested time to think about hospice.  They are interested in pursuing blood transfusion next week as sister says that this makes patient feel better.  They do recognize that hospice would likely exclude future transfusions.  PLAN: -Continue current scope of treatment -DNR/DNI -Follow-up telephone visit 2 weeks   Patient expressed  understanding and was in agreement with this plan. She also understands that She can call the clinic at any time with any questions, concerns, or complaints.     Time Total: 20 minutes  Visit consisted of counseling and education dealing with the complex and emotionally intense issues of symptom management and palliative care in the setting of serious and potentially life-threatening illness.Greater than 50%  of this time was spent counseling and coordinating care related to the above assessment and plan.  Signed by: Altha Harm, PhD, NP-C

## 2022-05-15 NOTE — Progress Notes (Signed)
Hematology/Oncology Consult note [facility]  [phone] [phone]  Patient Care Team: Pcp, No as PCP - General Clent Jacks, RN as Oncology Nurse Navigator Sindy Guadeloupe, MD as Consulting Physician (Oncology) [name], MD as Consulting Physician (Gastroenterology) Jules Husbands, MD as Consulting Physician (General Surgery)   Name of the patient: Alicia Coleman  [id number]  08-29-1956   Date of visit: 05/15/22  Diagnosis-metastatic colon cancer  Chief complaint/ Reason for visit-discuss goals of care  Heme/Onc history: Patient is a 66 year old female who was referred from the ER for severe iron deficiency anemia.  She received IV Venofer for that.  I was concerned about malignancy given the degree of anemia and therefore obtain CT abdomen and pelvis with contrast CT showed 6.4 cm enhancing mass in the right liver and another mass measuring 4 cm.  Large volume stool in the right colon transverse and descending colon with irregular mass lesion in the distal sigmoid colon extending into the rectosigmoid junction with lateral extracolonic extension into the pelvic sidewall.  Apparent luminal narrowing at the level of the lesion which accounts for large volume of stool proximally.  Multiple pericolonic and perirectal lymph nodes.  Multiple peritoneal nodules are seen in the lower right pelvic sidewall.  Colon mass appears to be invading posterior uterus.Thrombosis of the superior rectal vein may be bland thrombus although direct tumor invasion not excluded.   Patient had a flexible sigmoidoscopy which showed a large obstructing mass in the rectosigmoid colon.  Scope could not be traversed beyond the obstruction.  Biopsies were taken and were consistent with adenocarcinoma. Patient had a diverting colostomy on 05/01/2021 with Dr. Dahlia Byes   Patient started palliative FOLFOX chemotherapy in June 2022 with plans to add Avastin after 6 cycles of  treatment.  Patient developed allergic reaction to oxaliplatin with cycle 10 as evidenced by diaphoresis vomiting and flushing.  Plan is to hold off on further doses of oxaliplatin.  She has K-ras mutation positive   Progression noted on scans in January 2023.  Plan to switch to FOLFIRI bevacizumab.  Bevacizumab was stopped after there was concern for bleeding from the primary colorectal mass.  Chemotherapy on hold since 02/17/2022 due to poor performance status   Disease progression in May 2023 and patient switched to [facility].    Interval history-patient reports ongoing fatigue.  Appetite has been poor.  She has lost another 6 pounds as compared to her last visit.  Has ongoing bilateral lower extremity edema.  ECOG PS- 3 Pain scale- 0  Review of systems- Review of Systems  Constitutional:  Positive for malaise/fatigue and weight loss. Negative for chills and fever.  HENT:  Negative for congestion, ear discharge and nosebleeds.   Eyes:  Negative for blurred vision.  Respiratory:  Negative for cough, hemoptysis, sputum production, shortness of breath and wheezing.   Cardiovascular:  Positive for leg swelling. Negative for chest pain, palpitations, orthopnea and claudication.  Gastrointestinal:  Negative for abdominal pain, blood in stool, constipation, diarrhea, heartburn, melena, nausea and vomiting.  Genitourinary:  Negative for dysuria, flank pain, frequency, hematuria and urgency.  Musculoskeletal:  Negative for back pain, joint pain and myalgias.  Skin:  Negative for rash.  Neurological:  Negative for dizziness, tingling, focal weakness, seizures, weakness and headaches.  Endo/Heme/Allergies:  Does not bruise/bleed easily.  Psychiatric/Behavioral:  Negative for depression and suicidal ideas. The patient does not have insomnia.       Allergies  Allergen Reactions  Oxaliplatin Nausea And Vomiting     Past Medical History:  Diagnosis Date   Allergy    Anemia    Cancer ([place])     Colon cancer ([place])    GI bleed      Past Surgical History:  Procedure Laterality Date   COLONOSCOPY WITH PROPOFOL N/A 01/16/2022   Procedure: COLONOSCOPY WITH PROPOFOL;  Surgeon: [name], MD;  Location: [facility] ENDOSCOPY;  Service: Gastroenterology;  Laterality: N/A;   ESOPHAGOGASTRODUODENOSCOPY  01/16/2022   Procedure: ESOPHAGOGASTRODUODENOSCOPY (EGD);  Surgeon: [name], MD;  Location: [facility] ENDOSCOPY;  Service: Gastroenterology;;   Beryle Quant N/A 04/24/2021   Procedure: Beryle Quant;  Surgeon: [name], MD;  Location: [facility] ENDOSCOPY;  Service: Gastroenterology;  Laterality: N/A;   IR CV LINE INJECTION  11/13/2021   IR IMAGING GUIDED PORT INSERTION  11/19/2021   IR REMOVAL TUN ACCESS W/ PORT W/O FL MOD SED  11/19/2021   PORTACATH PLACEMENT N/A 05/01/2021   Procedure: INSERTION PORT-A-CATH;  Surgeon: Jules Husbands, MD;  Location: [facility] ORS;  Service: General;  Laterality: N/A;    Social History   Socioeconomic History   Marital status: Married    Spouse name: Not on file   Number of children: Not on file   Years of education: Not on file   Highest education level: Not on file  Occupational History   Not on file  Tobacco Use   Smoking status: Never   Smokeless tobacco: Never  Vaping Use   Vaping Use: Never used  Substance and Sexual Activity   Alcohol use: Not Currently   Drug use: Never   Sexual activity: Not Currently  Other Topics Concern   Not on file  Social History Narrative   Not on file   Social Determinants of Health   Financial Resource Strain: Not on file  Food Insecurity: Not on file  Transportation Needs: Not on file  Physical Activity: Not on file  Stress: Not on file  Social Connections: Not on file  Intimate Partner Violence: Not on file    Family History  Problem Relation Age of Onset   Stroke Mother    Hypertension Mother    Cancer Father      Current Outpatient Medications:    acetaminophen (TYLENOL) 325 MG  tablet, Take 650 mg by mouth every 6 (six) hours as needed., Disp: , Rfl:    feeding supplement (ENSURE ENLIVE / ENSURE PLUS) LIQD, Take 237 mLs by mouth 3 (three) times daily between meals., Disp: 14220 mL, Rfl: 0   Multiple Vitamin (MULTIVITAMIN WITH MINERALS) TABS tablet, Take 1 tablet by mouth daily., Disp: 30 tablet, Rfl: 0   Simethicone (GAS-X MAXIMUM STRENGTH PO), Take by mouth., Disp: , Rfl:    trifluridine-tipiracil (LONSURF) 15-6.14 MG tablet, Take 3 tablets (45 mg of trifluridine total) by mouth 2 (two) times daily after a meal. Take within 1 hr of AM & PM meals on days 1-5. Repeat every 14 days, Disp: 60 tablet, Rfl: 1   lidocaine-prilocaine (EMLA) cream, Apply 1 application. topically as needed. Apply to port and cover with saran wrap 1-2 hours prior to port access, Disp: 30 g, Rfl: 4   potassium chloride SA (KLOR-CON M) 20 MEQ tablet, Take 20 mEq by mouth daily. (Patient not taking: Reported on 04/01/2022), Disp: , Rfl:    predniSONE (DELTASONE) 10 MG tablet, Take 1 tablet (10 mg total) by mouth daily with breakfast. (Patient not taking: Reported on 04/01/2022), Disp: 14 tablet, Rfl: 0 No current facility-administered  medications for this visit.  Facility-Administered Medications Ordered in Other Visits:    0.9 %  sodium chloride infusion, , Intravenous, Once, Sindy Guadeloupe, MD   dexamethasone (DECADRON) 10 mg in sodium chloride 0.9 % 50 mL IVPB, 10 mg, Intravenous, Once, Sindy Guadeloupe, MD  Physical exam:  Vitals:   05/15/22 0959  BP: 121/77  Pulse: 93  Resp: 16  Temp: 98.6 F (37 C)  SpO2: 100%  Weight: 124 lb 11.2 oz (56.6 kg)   Physical Exam Constitutional:      Comments: Patient is thin and cachectic and sitting in a wheelchair.  Cardiovascular:     Rate and Rhythm: Normal rate and regular rhythm.     Heart sounds: Normal heart sounds.  Pulmonary:     Effort: Pulmonary effort is normal.     Breath sounds: Normal breath sounds.  Abdominal:     Comments: Colostomy in  place  Musculoskeletal:     Right lower leg: Edema present.     Left lower leg: Edema present.  Skin:    General: Skin is warm and dry.  Neurological:     Mental Status: She is alert and oriented to person, place, and time.         Latest Ref Rng & Units 05/15/2022    9:44 AM  CMP  Glucose 70 - 99 mg/dL 93   BUN 8 - 23 mg/dL 7   Creatinine 0.44 - 1.00 mg/dL 0.46   Sodium 135 - 145 mmol/L 135   Potassium 3.5 - 5.1 mmol/L 3.6   Chloride 98 - 111 mmol/L 103   CO2 22 - 32 mmol/L 25   Calcium 8.9 - 10.3 mg/dL 8.2       Latest Ref Rng & Units 05/15/2022    9:44 AM  CBC  WBC 4.0 - 10.5 K/uL 4.2   Hemoglobin 12.0 - 15.0 g/dL 6.2   Hematocrit 36.0 - 46.0 % 19.8   Platelets 150 - 400 K/uL 143     Assessment and plan- Patient is a 66 y.o. female with metastatic colon cancer currently on third line Lonsurf here to discuss further management  Overall Patient is doing poorly on Lonsurf.  Although she is tolerating the medication without any significant nausea or vomiting, her weight is declining and her appetite is poor.  Her hemoglobin today is 6.2 and in general she has been requiring blood transfusions almost every other week.  CEA from today is pending but overall is trending up from 347 2 months ago to 4214 3 weeks ago indicated that Frankey Poot is not working and likely her disease is progressing.  We discussed not doing any further active chemotherapy and pursuing best supportive care/hospice.  However patient would like to think about it and is not quite ready to pursue hospice.  She would like to come and receive blood transfusion next week.  She was seen by palliative care today and he will be following up with her in 1 to 2 weeks as well.  For now from my side plan is to not continue any further Lonsurf and support her with decision making and symptom management until she decides to pursue hospice   Visit Diagnosis 1. Primary malignant neoplasm of colon with metastasis in female [facility])    2. Goals of care, counseling/discussion      Dr. Randa Evens, MD, MPH [facility] [phone] 05/15/2022 4:40 PM

## 2022-05-15 NOTE — Progress Notes (Signed)
Pt states the swelling in her feet seems to be getting worse: when laying down not as bad but when up and at it feet swell up and feels "like she is carrying bricks around".

## 2022-05-15 NOTE — Progress Notes (Signed)
Nutrition  RD planning to see patient during infusion but not done today.  Note patient considering hospice.    RD available if needed.  Mortimer Bair B. Zenia Resides, [place], [place] Registered Dietitian [phone]

## 2022-05-16 LAB — CEA: CEA: 6974 ng/mL — ABNORMAL HIGH (ref 0.0–4.7)

## 2022-05-18 ENCOUNTER — Telehealth: Payer: BC Managed Care – PPO | Admitting: Hospice and Palliative Medicine

## 2022-05-20 ENCOUNTER — Other Ambulatory Visit: Payer: Self-pay

## 2022-05-20 ENCOUNTER — Inpatient Hospital Stay: Payer: BC Managed Care – PPO

## 2022-05-20 DIAGNOSIS — C187 Malignant neoplasm of sigmoid colon: Secondary | ICD-10-CM | POA: Diagnosis not present

## 2022-05-20 DIAGNOSIS — D649 Anemia, unspecified: Secondary | ICD-10-CM

## 2022-05-20 DIAGNOSIS — C189 Malignant neoplasm of colon, unspecified: Secondary | ICD-10-CM

## 2022-05-20 LAB — BASIC METABOLIC PANEL
Anion gap: 8 (ref 5–15)
BUN: 7 mg/dL — ABNORMAL LOW (ref 8–23)
CO2: 24 mmol/L (ref 22–32)
Calcium: 8.4 mg/dL — ABNORMAL LOW (ref 8.9–10.3)
Chloride: 103 mmol/L (ref 98–111)
Creatinine, Ser: 0.6 mg/dL (ref 0.44–1.00)
GFR, Estimated: >60 mL/min (ref >60)
Glucose, Bld: 94 mg/dL (ref 70–99)
Potassium: 3.4 mmol/L — ABNORMAL LOW (ref 3.5–5.1)
Sodium: 135 mmol/L (ref 135–145)

## 2022-05-20 LAB — CBC WITH DIFFERENTIAL/PLATELET
Abs Immature Granulocytes: 0.03 10*3/uL (ref 0.00–0.07)
Basophils Absolute: 0 10*3/uL (ref 0.0–0.1)
Basophils Relative: 1 %
Eosinophils Absolute: 0 10*3/uL (ref 0.0–0.5)
Eosinophils Relative: 1 %
HCT: 25.1 % — ABNORMAL LOW (ref 36.0–46.0)
Hemoglobin: 7.7 g/dL — ABNORMAL LOW (ref 12.0–15.0)
Immature Granulocytes: 1 %
Lymphocytes Relative: 13 %
Lymphs Abs: 0.3 10*3/uL — ABNORMAL LOW (ref 0.7–4.0)
MCH: 27.4 pg (ref 26.0–34.0)
MCHC: 30.7 g/dL (ref 30.0–36.0)
MCV: 89.3 fL (ref 80.0–100.0)
Monocytes Absolute: 0.9 10*3/uL (ref 0.1–1.0)
Monocytes Relative: 35 %
Neutro Abs: 1.2 10*3/uL — ABNORMAL LOW (ref 1.7–7.7)
Neutrophils Relative %: 49 %
Platelets: 392 10*3/uL (ref 150–400)
RBC: 2.81 MIL/uL — ABNORMAL LOW (ref 3.87–5.11)
RDW: 16.4 % — ABNORMAL HIGH (ref 11.5–15.5)
Smear Review: NORMAL
WBC: 2.5 10*3/uL — ABNORMAL LOW (ref 4.0–10.5)
nRBC: 0 % (ref 0.0–0.2)

## 2022-05-20 LAB — SAMPLE TO BLOOD BANK

## 2022-05-21 ENCOUNTER — Other Ambulatory Visit: Payer: Self-pay | Admitting: *Deleted

## 2022-05-21 ENCOUNTER — Other Ambulatory Visit: Payer: BC Managed Care – PPO

## 2022-05-21 ENCOUNTER — Telehealth: Payer: Self-pay | Admitting: *Deleted

## 2022-05-21 ENCOUNTER — Other Ambulatory Visit: Payer: Self-pay | Admitting: Oncology

## 2022-05-21 DIAGNOSIS — D649 Anemia, unspecified: Secondary | ICD-10-CM

## 2022-05-21 LAB — PREPARE RBC (CROSSMATCH)

## 2022-05-21 NOTE — Telephone Encounter (Signed)
Got form from [place] for her disability. Form filled out and faxed to [phone] and phone is [phone]. Transmission complete

## 2022-05-22 ENCOUNTER — Inpatient Hospital Stay: Payer: BC Managed Care – PPO

## 2022-05-22 DIAGNOSIS — D649 Anemia, unspecified: Secondary | ICD-10-CM

## 2022-05-22 DIAGNOSIS — C187 Malignant neoplasm of sigmoid colon: Secondary | ICD-10-CM | POA: Diagnosis not present

## 2022-05-22 MED ORDER — ACETAMINOPHEN 325 MG PO TABS
650.0000 mg | ORAL_TABLET | Freq: Once | ORAL | Status: AC
  Administered 2022-05-22: 650 mg via ORAL
  Filled 2022-05-22: qty 2

## 2022-05-22 MED ORDER — HEPARIN SOD (PORK) LOCK FLUSH 100 UNIT/ML IV SOLN
500.0000 [IU] | Freq: Every day | INTRAVENOUS | Status: AC | PRN
  Administered 2022-05-22: 500 [IU]
  Filled 2022-05-22: qty 5

## 2022-05-22 MED ORDER — SODIUM CHLORIDE 0.9% IV SOLUTION
250.0000 mL | Freq: Once | INTRAVENOUS | Status: AC
  Administered 2022-05-22: 250 mL via INTRAVENOUS
  Filled 2022-05-22: qty 250

## 2022-05-23 LAB — TYPE AND SCREEN
ABO/RH(D): O POS
Antibody Screen: NEGATIVE
Unit division: 0

## 2022-05-23 LAB — BPAM RBC
Blood Product Expiration Date: 202306292359
ISSUE DATE / TIME: 202306230822
Unit Type and Rh: 5100

## 2022-05-28 ENCOUNTER — Encounter: Payer: Self-pay | Admitting: Licensed Clinical Social Worker

## 2022-05-28 NOTE — Progress Notes (Signed)
[place] Work  Clinical Social Work was referred by medical provider for assessment of psychosocial needs.  Clinical Social Worker attempted to contact patient by phone  to offer support and assess for needs.  Patient's voicemail is full, unable to leave voicemail.  From last Palliative NP note - patient currently declining and palliative NP has met with patient has discussed hospice options with patient and patient's sister.   2nd attempt  Adelene Amas, [place]

## 2022-05-29 ENCOUNTER — Inpatient Hospital Stay (HOSPITAL_BASED_OUTPATIENT_CLINIC_OR_DEPARTMENT_OTHER): Payer: BC Managed Care – PPO | Admitting: Hospice and Palliative Medicine

## 2022-05-29 DIAGNOSIS — Z515 Encounter for palliative care: Secondary | ICD-10-CM | POA: Diagnosis not present

## 2022-05-29 DIAGNOSIS — D649 Anemia, unspecified: Secondary | ICD-10-CM

## 2022-05-29 NOTE — Progress Notes (Signed)
Virtual Visit via Telephone Note  I connected with Alicia Coleman on 05/29/22 at 11:30 AM EDT by telephone and verified that I am speaking with the correct person using two identifiers.  Location: Patient: Home Provider: Clinic   I discussed the limitations, risks, security and privacy concerns of performing an evaluation and management service by telephone and the availability of in person appointments. I also discussed with the patient that there may be a patient responsible charge related to this service. The patient expressed understanding and agreed to proceed.   History of Present Illness: Alicia Coleman is a 66 y.o. female with multiple medical problems including metastatic colon cancer with liver metastasis started on FOLFOX chemotherapy in June 2022 with disease progression noted in January 2023 and patient was rotated to FOLFIRI plus Bev.  CTs in February 2023 are concerning for disease progression.  Patient was tried on Lonsurf.  Patient has had declining performance status.  She is referred to palliative care to help address goals.   Observations/Objective: I called and spoke with patient today.  She says that she is feeling better and denies significant changes or concerns.  She did undergo transfusion recently.  Patient is off treatment and hospice has been recommended given overall decline.  Today, patient says that she is having someone help her with bathing and resources at home that she thinks is associated with hospice but she could not tell me the name of the company.  I again offered hospice but patient said that she wanted to stick with what ever resources she currently has involved.  We will instead send a referral to home palliative care and hopefully they can evaluate.  Offered to see patient again in a couple weeks for labs and clinic eval but patient stated that she would prefer just to have a telephone call.  Assessment and Plan: Stage IV colon cancer -best supportive  care.  Referral to home palliative care.  Recommend hospice  Case and plan discussed with Dr. Janese Banks  Follow Up Instructions: Follow-up telephone visit 2 weeks   I discussed the assessment and treatment plan with the patient. The patient was provided an opportunity to ask questions and all were answered. The patient agreed with the plan and demonstrated an understanding of the instructions.   The patient was advised to call back or seek an in-person evaluation if the symptoms worsen or if the condition fails to improve as anticipated.  I provided 5 minutes of non-face-to-face time during this encounter.   Irean Hong, NP

## 2022-06-05 ENCOUNTER — Telehealth: Payer: Self-pay

## 2022-06-05 NOTE — Telephone Encounter (Signed)
Reached out to pts sister in regards to her disability papers; paperwork is completed currently waiting on physicians signature to be able to fax completed paperwork. Will be faxed today. Left VM explaining details.

## 2022-06-05 NOTE — Telephone Encounter (Signed)
Patient sister Peyson Delao called stating she sent in some paper work from [place] for disability to be filled out and was wondering if this paper work was ready. Ora Cena states she can be reached via voicemail  [phone].

## 2022-06-11 ENCOUNTER — Encounter: Payer: Self-pay | Admitting: Licensed Clinical Social Worker

## 2022-06-11 NOTE — Progress Notes (Signed)
Alicia Coleman  Clinical Social Coleman was referred by medical provider for assessment of psychosocial needs.  Clinical Social Worker attempted to contact patient by phone  to offer support and assess for needs.  CSW called patient's cell phone and home phone and neither had the voicemail set up, nor was the an answer. Patient currently pending disability approval.  3rd attempt  Adelene Amas, [place]

## 2022-06-12 ENCOUNTER — Inpatient Hospital Stay: Payer: BC Managed Care – PPO | Attending: Oncology | Admitting: Hospice and Palliative Medicine

## 2022-06-12 DIAGNOSIS — D649 Anemia, unspecified: Secondary | ICD-10-CM

## 2022-06-12 NOTE — Progress Notes (Signed)
Patient is now being followed by [facility] hospice. I tried calling patient but no answer. I spoke with hospice triage nurse.

## 2022-07-28 IMAGING — CT CT CTA ABD/PEL W/CM AND/OR W/O CM
3 of 9 series · 10 of 46 positions shown, 16 images · IV contrast (agent unspecified)
Comparison: Comparison is made with December 15, 2021.

CLINICAL DATA: A 65-year-old female presents for evaluation of GI
bleeding.

EXAM:
CTA ABDOMEN AND PELVIS WITHOUT AND WITH CONTRAST
TECHNIQUE: Multidetector CT imaging of the abdomen and pelvis was performed
using the standard protocol during bolus administration of
intravenous contrast. Multiplanar reconstructed images and MIPs were
obtained and reviewed to evaluate the vascular anatomy.

[Series 5: axial arterial · axial · arterial · 0.69mm/px · z∈[-1077,-909]mm · 4 of 237 slices shown]
[im 28/237  soft-tissue]
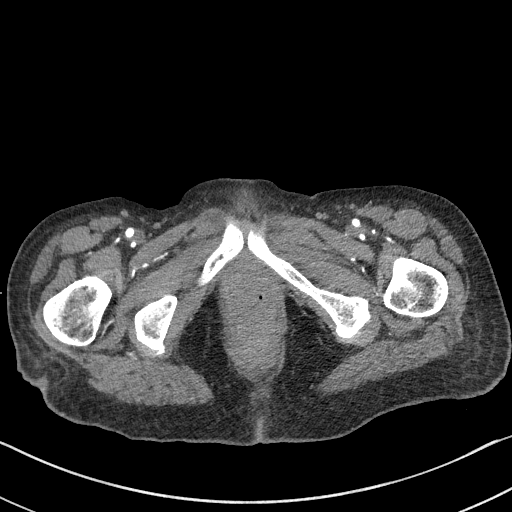
[im 56/237  soft-tissue]
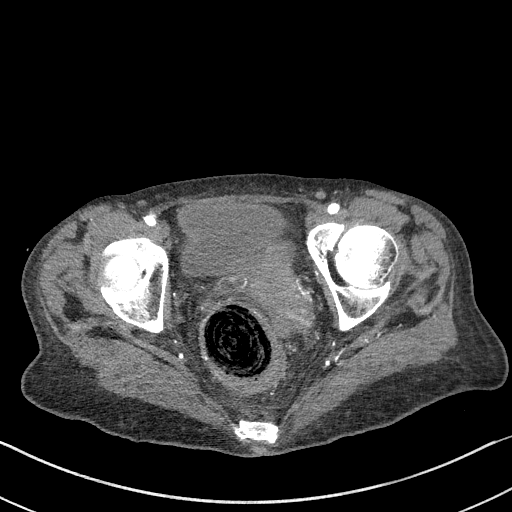
[im 84/237  soft-tissue]
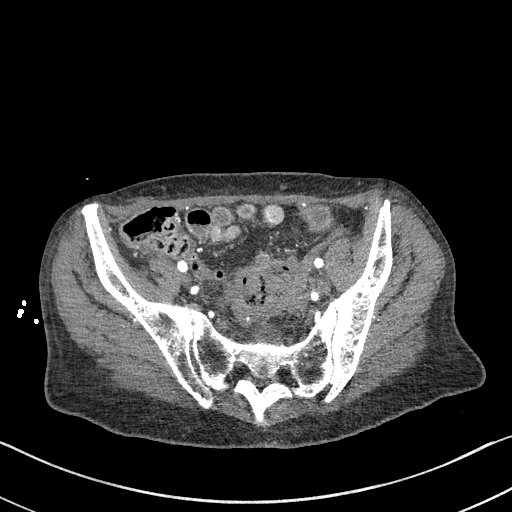
[im 112/237  soft-tissue]
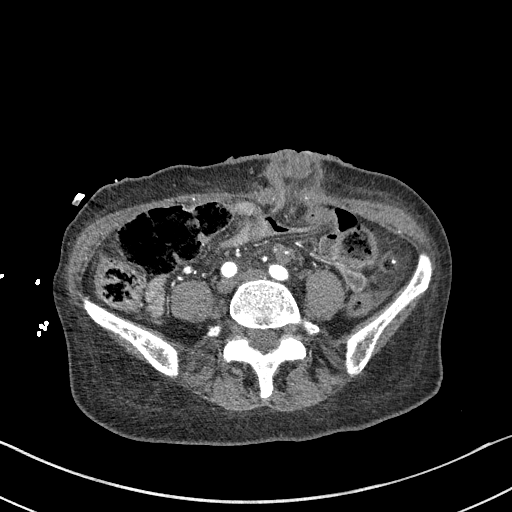

[Series 7: axial venous · axial · portal-venous · 0.69mm/px · z∈[-1054,-739]mm · 5 of 95 slices shown, 10 images]
[im 16/95  soft-tissue]
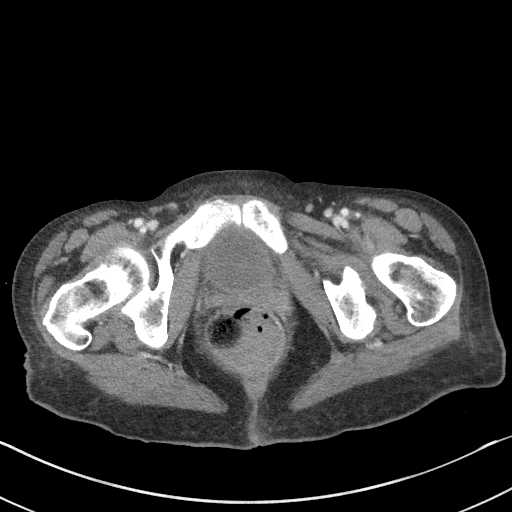
[im 16/95  bone]
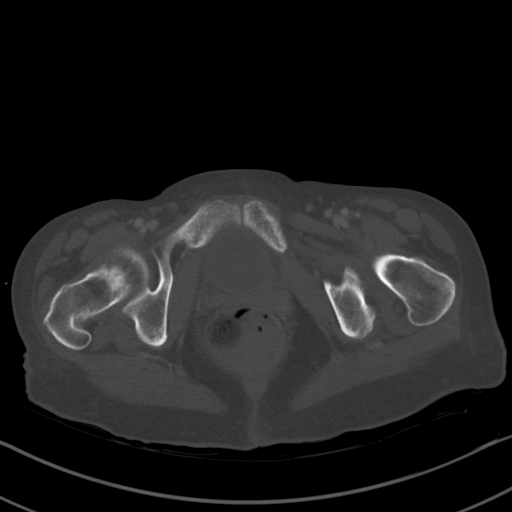
[im 32/95  soft-tissue]
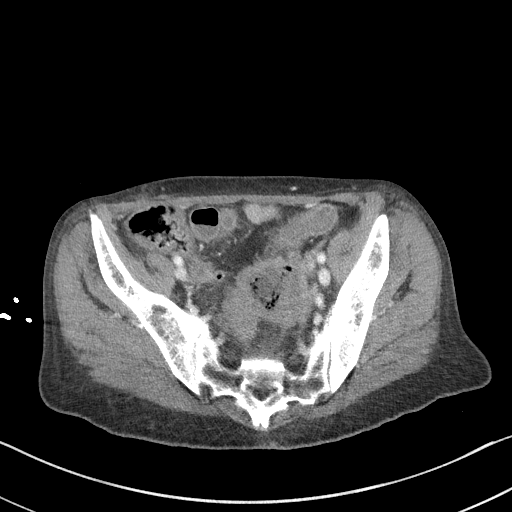
[im 32/95  lung]
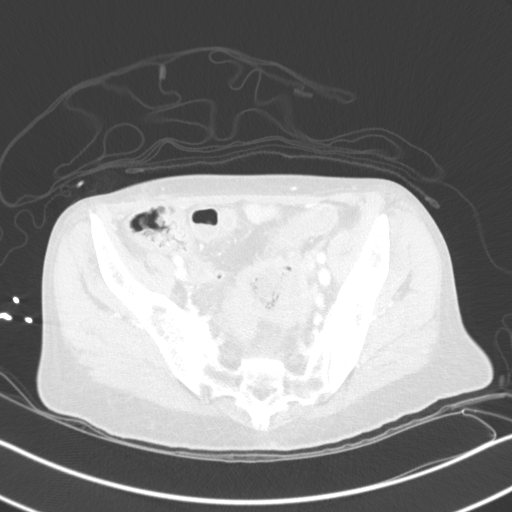
[im 48/95  soft-tissue]
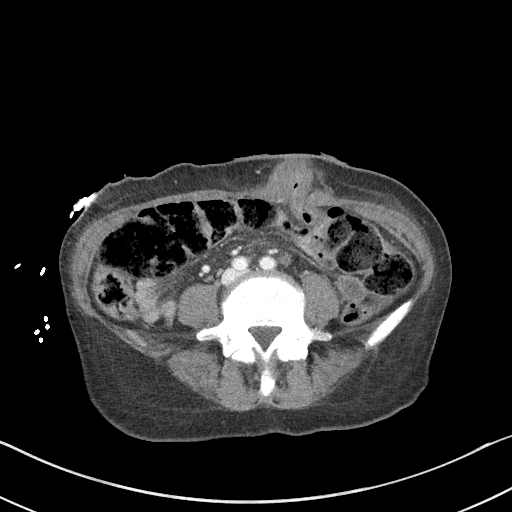
[im 48/95  lung]
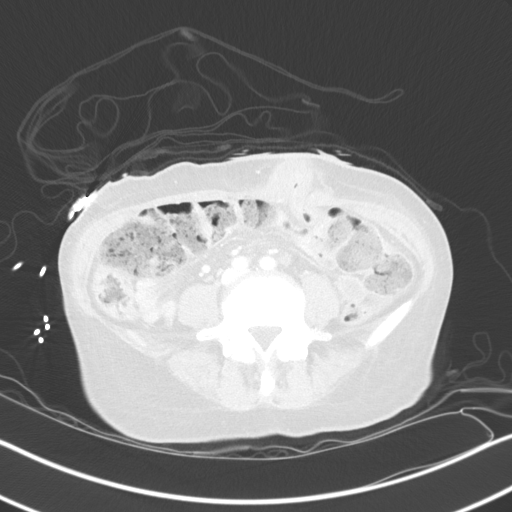
[im 63/95  soft-tissue]
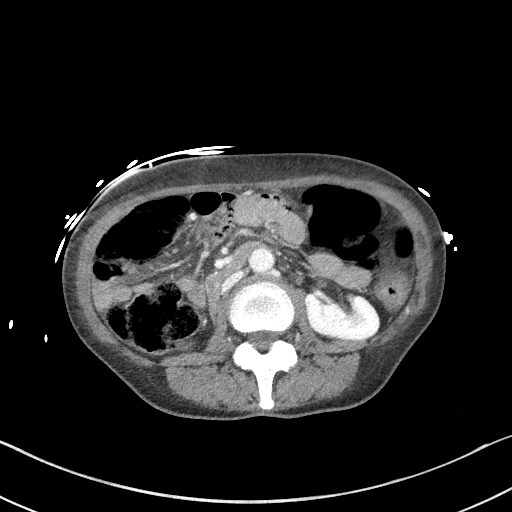
[im 63/95  lung]
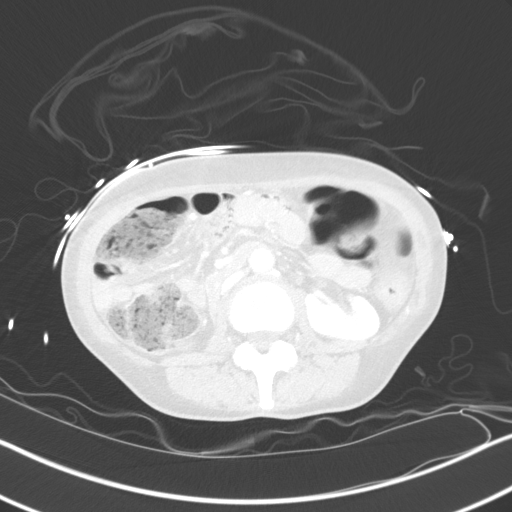
[im 79/95  soft-tissue]
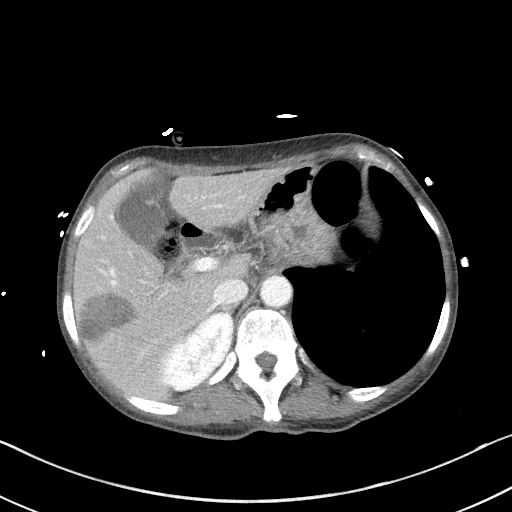
[im 79/95  lung]
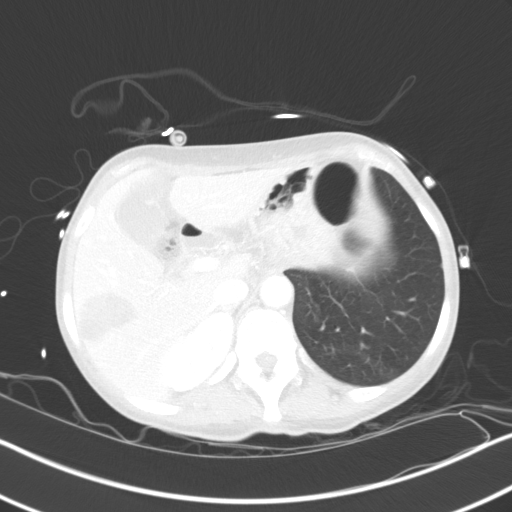

[Series 9: cor arterial mpr · coronal · arterial · 0.79mm/px · 1 of 68 slices shown, 2 images]
[im 34/68  soft-tissue]
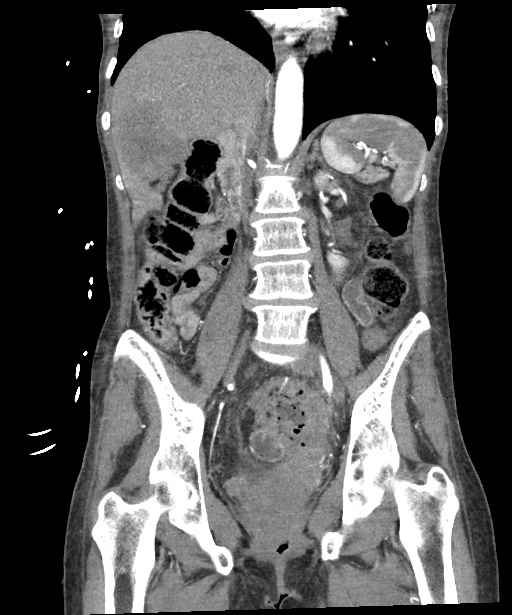
[im 34/68  bone]
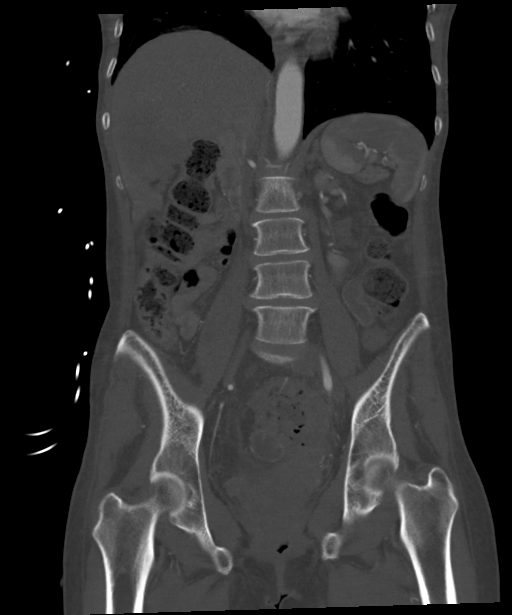

[10 of 46 positions shown; findings below may reference images not displayed]

RADIATION DOSE REDUCTION: This exam was performed according to the
departmental dose-optimization program which includes automated
exposure control, adjustment of the mA and/or kV according to
patient size and/or use of iterative reconstruction technique.

CONTRAST:  100mL OMNIPAQUE IOHEXOL 350 MG/ML SOLN
FINDINGS: VASCULAR

Aorta: Normal caliber aorta without aneurysm, dissection, vasculitis
or significant stenosis.

Celiac: Widely patent with normal caliber. No signs of dissection or
aneurysm.

SMA: Patent without evidence of aneurysm, dissection, vasculitis or
significant stenosis.

Renals: Single renal arteries which are patent without signs of
acute process.

IMA: Patent without evidence of aneurysm, dissection, vasculitis or
significant stenosis.

Inflow: Patent without evidence of aneurysm, dissection, vasculitis
or significant stenosis.

Proximal Outflow: Bilateral common femoral and visualized portions
of the superficial and profunda femoral arteries are patent without
evidence of aneurysm, dissection, vasculitis or significant
stenosis.

Veins: Smooth contours of the IVC. Normal caliber of the iliac
veins.

Review of the MIP images confirms the above findings.

NON-VASCULAR

Lower chest: The incidental imaging of the lung bases with basilar
atelectasis, no effusion or sign of consolidation.

Hepatobiliary: Signs of RIGHT hepatic mass (image [DATE]) 5.8 x 5.4 as
compared to 5.3 x 5.2 cm on the study from [REDACTED] cephalad
extension from the mass appears similar. There are no new hepatic
lesions. The portal vein is patent. No pericholecystic stranding.

Pancreas: Unremarkable. No pancreatic ductal dilatation or
surrounding inflammatory changes.

Spleen: Unremarkable.

Adrenals/Urinary Tract: Adrenal glands are normal.

There is symmetric renal enhancement with a small LEFT renal cyst.
No hydronephrosis. No perinephric stranding. No stranding adjacent
to the urinary bladder.

Stomach/Bowel: No acute small bowel process. Stomach is
unremarkable. Small bowel is nondilated.

The appendix is normal.

Double barrel ostomy in the LEFT lower quadrant, diverting colostomy
is again noted. Downstream from the diverting colostomy is a large
colonic mass with extra colonic extension and central necrotic
debris which is in communication with the colonic lumen. This shows
no substantial change compared to imaging from [REDACTED] and
there are signs of extensive enhancing tumor that abuts the uterus
and extends along the LEFT rectal wall, associated with LEFT pelvic
sidewall nodularity near the sacro sciatic notch which is unchanged.
No overt signs of active extravasation though this is likely the
site of bleeding. A moderate amount of stool, formed stool remains
present in the rectum without increase in moderate rectal distension
that was seen on the previous exam.

Nodular soft tissue changes in the sigmoid mesentery show no change
compatible with tumor in this area.

Dominant area of the mass measuring up to 6.3 x 4.1 cm as compared
to 5.9 x 3.9 cm. There is higher density material within the
upstream lumen of the colon which is mildly distended previously
collapsed on the prior imaging study.

Lymphatic: Normal caliber of the abdominal aorta as outlined above.
Signs of mesenteric adenopathy and LEFT pelvic sidewall adenopathy
the not changed since previous imaging. Nodule or lymph node at the
sacro sciatic notch measuring 14 mm is stable over the short
interval.

Reproductive: Uterus is inseparable from the pelvic mass that arises
from the sigmoid colon.

Other: No free air or signs of ascites.

Musculoskeletal: Osteopenia. No acute musculoskeletal process or
destructive bone finding.
IMPRESSION: VASCULAR

No acute vascular process such as discrete site of extravasation
identified on the current study.

NON-VASCULAR

Prominent vessels about a large pelvic mass compatible with known
colonic neoplasm with extension beyond the rectal lumen, involvement
of adjacent pelvic structures now with upstream dilation and
high-density material in the lumen of the diverted colon below the
double barrel ostomy. Findings are compatible with tumor related
bleeding.

Signs of metastatic disease and local invasion. Enlarging colonic
neoplasm and increasing size in the short interval of the RIGHT
hepatic lobe mass.

## 2022-07-31 DEATH — deceased

## 2022-09-08 IMAGING — DX DG CHEST 1V PORT
1 series · 1 of 1 positions shown · non-contrast
Comparison: Radiograph May 01, 2021

CLINICAL DATA: Increased weakness

EXAM:
PORTABLE CHEST 1 VIEW

[chest ap]
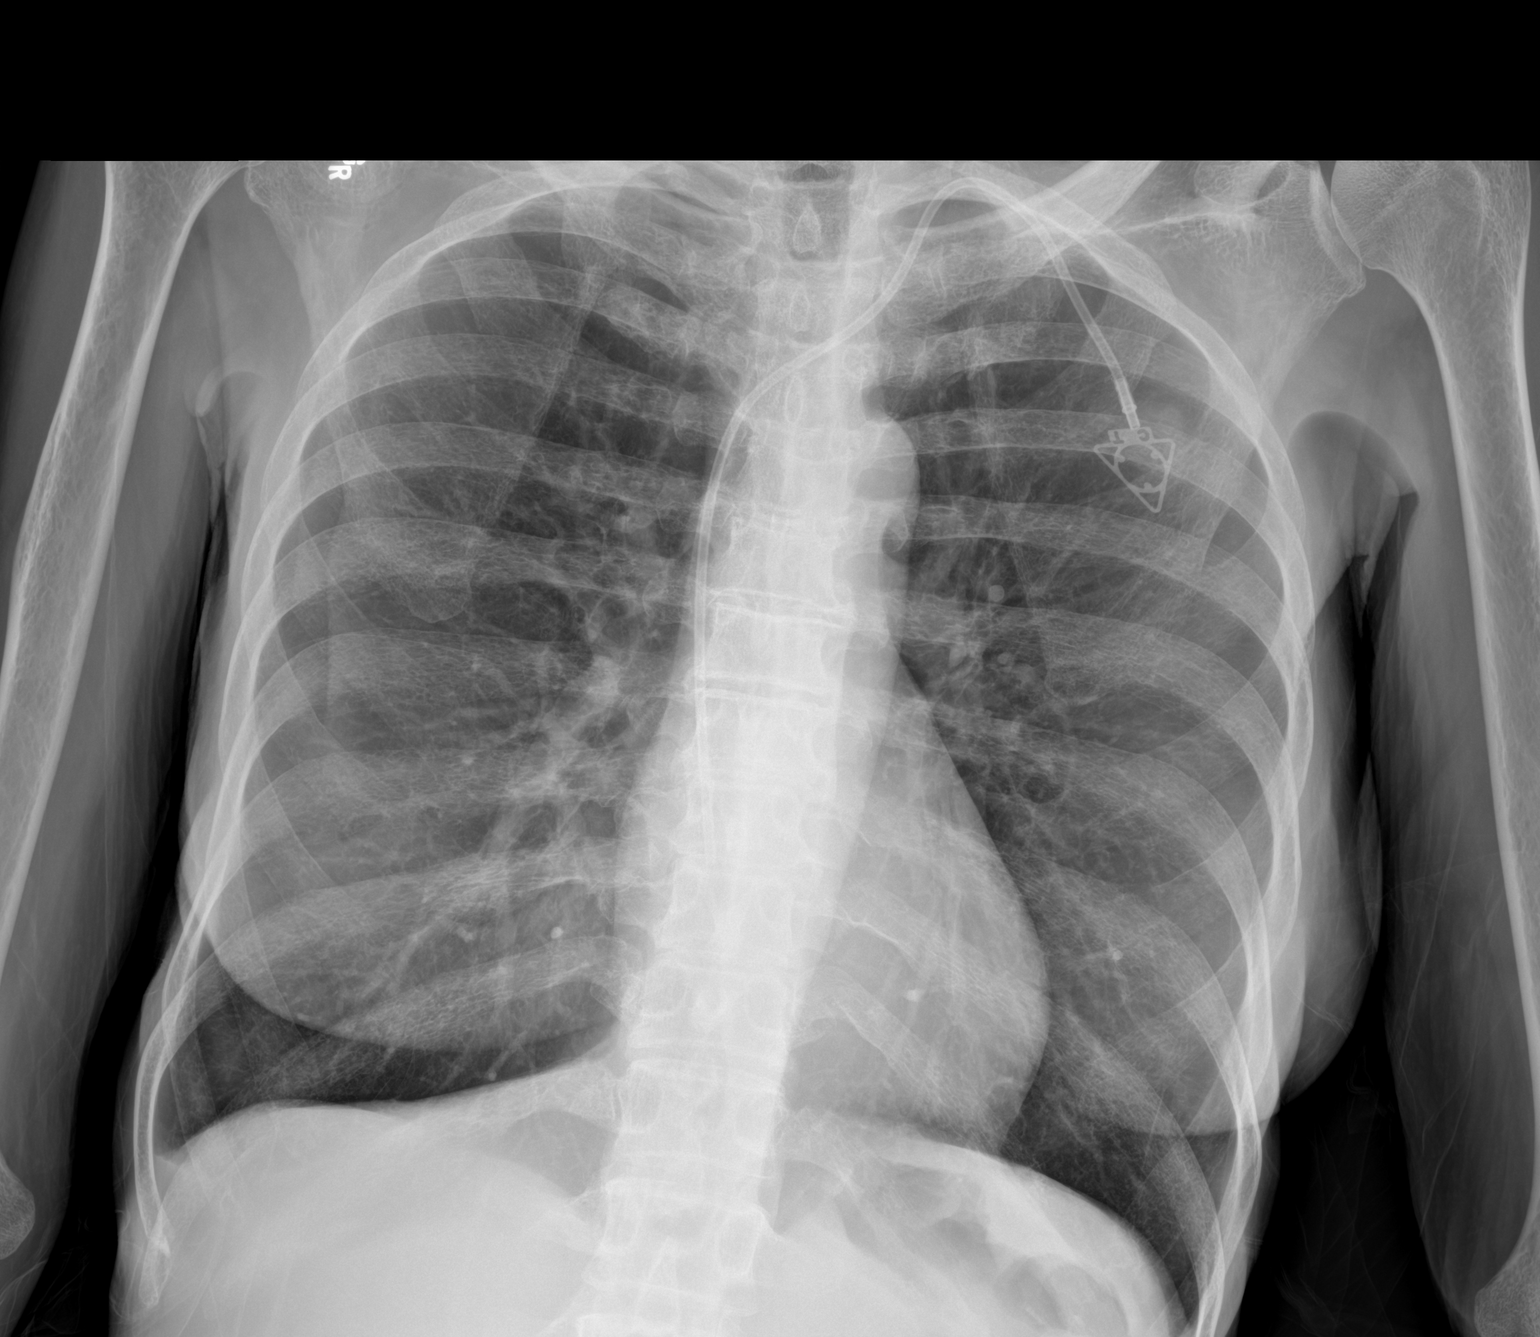

[1 of 1 positions shown; findings below may reference images not displayed]

FINDINGS: Left chest wall Port-A-Cath with tip overlying the superior
cavoatrial confluence. The heart size and mediastinal contours are
within normal limits. No focal airspace consolidation. No visible
pleural effusion or pneumothorax. The visualized skeletal structures
are unremarkable.
IMPRESSION: No acute cardiopulmonary findings.

## 2024-08-18 ENCOUNTER — Other Ambulatory Visit: Payer: Self-pay
